# Patient Record
Sex: Female | Born: 1995 | Hispanic: No | Marital: Single | Smoking: Never smoker
Health system: Southern US, Community
[De-identification: ages and names within clinical notes are randomized; demographics above are authoritative.]

## PROBLEM LIST (undated history)

## (undated) ENCOUNTER — Inpatient Hospital Stay (HOSPITAL_COMMUNITY): Payer: Self-pay

## (undated) DIAGNOSIS — L509 Urticaria, unspecified: Secondary | ICD-10-CM

## (undated) DIAGNOSIS — O418X90 Other specified disorders of amniotic fluid and membranes, unspecified trimester, not applicable or unspecified: Secondary | ICD-10-CM

## (undated) DIAGNOSIS — G43909 Migraine, unspecified, not intractable, without status migrainosus: Secondary | ICD-10-CM

## (undated) DIAGNOSIS — I82409 Acute embolism and thrombosis of unspecified deep veins of unspecified lower extremity: Secondary | ICD-10-CM

## (undated) DIAGNOSIS — T7840XA Allergy, unspecified, initial encounter: Secondary | ICD-10-CM

## (undated) HISTORY — DX: Other specified disorders of amniotic fluid and membranes, unspecified trimester, not applicable or unspecified: O41.8X90

## (undated) HISTORY — PX: SHOULDER SURGERY: SHX246

## (undated) HISTORY — DX: Urticaria, unspecified: L50.9

## (undated) HISTORY — DX: Migraine, unspecified, not intractable, without status migrainosus: G43.909

## (undated) HISTORY — DX: Allergy, unspecified, initial encounter: T78.40XA

## (undated) HISTORY — PX: WISDOM TOOTH EXTRACTION: SHX21

---
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
## 2013-01-23 ENCOUNTER — Telehealth: Payer: Self-pay | Admitting: Pediatrics

## 2013-01-23 NOTE — Telephone Encounter (Signed)
Headache calendar from April 2014 on [place]. 29 days were recorded.  8 days were headache free.  19 days were associated with tension type headaches, 4 required treatment.  There were 2 days of migraines, 2 were severe.  There were 3 days of blurry vision without headaches, 2 days of dizziness without headaches.  There is no reason to change current treatment.  Please contact the family.  Please find out if sumatriptan helped.

## 2013-01-24 NOTE — Telephone Encounter (Signed)
I spoke with [name] the patient's mom informing her that Dr. Sharene Skeans has reviewed [name] diary and there's no need to make any changes and a reminder to send in May when completed, mom agreed. I asked mom on the 2 days that the migraines were severe did the Sumatriptan work for the patient and mom stated that the patient informed her that she was unable to get any relief on those two occasions that the migraine was severe with nausea and nothing helped. Mom also feels that Melissa Boyd has final exams coming up and she's very stressed out from this and that's why her migraines have started to come back. Thanks,

## 2013-02-15 ENCOUNTER — Telehealth: Payer: Self-pay | Admitting: Pediatrics

## 2013-02-15 NOTE — Telephone Encounter (Signed)
Headache calendar from May 2014 on [place]. 29 days were recorded.  0 days were headache free.  27 days were associated with tension type headaches, 5 required treatment.  There were 2 days of migraines, 1 was severe.  There is no reason to change current treatment.  Please contact the patient.

## 2013-02-16 NOTE — Telephone Encounter (Signed)
I spoke with [name] the patient's mom informing her that Dr. Sharene Skeans has reviewed Melissa Boyd's May diary and there's no need to make any changes and a reminder to send in June when completed, mom agreed. MB

## 2013-03-21 ENCOUNTER — Telehealth: Payer: Self-pay | Admitting: Pediatrics

## 2013-03-21 DIAGNOSIS — G43009 Migraine without aura, not intractable, without status migrainosus: Secondary | ICD-10-CM

## 2013-03-21 NOTE — Telephone Encounter (Signed)
Headache calendar from June 2014 on [place]. 30 days were recorded.  0 days were headache free.  25 days were associated with tension type headaches, 9 required treatment.  There were 5 days of migraines, 2 were severe.

## 2013-03-23 NOTE — Telephone Encounter (Signed)
I was unable to leave a message on the patient's phone and left a message on her mother's phone.

## 2013-03-24 MED ORDER — SUMATRIPTAN 20 MG/ACT NA SOLN: NASAL | Status: DC

## 2013-03-24 NOTE — Telephone Encounter (Signed)
Pleshette has returned Dr. Darl Householder call and she asked that he call her back at [phone]. MB

## 2013-03-24 NOTE — Telephone Encounter (Signed)
We decided not to increase topiramate because it may effect the patient's birth control.  Instead I am going to try nasal sumatriptan.

## 2013-03-27 ENCOUNTER — Telehealth: Payer: Self-pay | Admitting: Family

## 2013-03-27 NOTE — Telephone Encounter (Signed)
Melissa Boyd left a message saying that today she had a burning sensation in her face along with a stabbing painful headache. I called her back and she said that the burning sensation was at her nose to around eye and the right side top of head. She said that it went away after about 3-4 minutes but was very painful while present. I explained innervation to her face and that some people experience burning in the face/cheek/eye region along with ice pick type headache with migraine variant. I asked her to note it on her headache diary. She had no further questions. Inocencia's numbers [id number] or [phone]. TG

## 2013-03-27 NOTE — Telephone Encounter (Signed)
Thank you for the message.  I agree with your assessment and recommendations.  I did not call the patient back.

## 2013-05-01 ENCOUNTER — Telehealth: Payer: Self-pay | Admitting: Pediatrics

## 2013-05-01 NOTE — Telephone Encounter (Signed)
Headache calendar from July 2014 on [place]. 31 days were recorded.  6 days were headache free.  20 days were associated with tension type headaches, 3 required treatment.  There were 2 days of migraines, 0 were severe.  Will to today's of blurred vision in 1 a burning stabbing sensation in the right face.  There is no reason to change current treatment.  Please contact the family.

## 2013-05-02 ENCOUNTER — Other Ambulatory Visit: Payer: Self-pay | Admitting: Orthopedic Surgery

## 2013-05-02 DIAGNOSIS — M545 Low back pain: Secondary | ICD-10-CM

## 2013-05-02 DIAGNOSIS — M549 Dorsalgia, unspecified: Secondary | ICD-10-CM

## 2013-05-02 NOTE — Telephone Encounter (Signed)
I spoke with Melissa Boyd informing her that Dr. Sharene Skeans has reviewed her July diary and there's no need to make any changes and a reminder to send in August when completed, she agreed. MB

## 2013-05-04 ENCOUNTER — Encounter: Payer: Self-pay | Admitting: Family

## 2013-05-04 ENCOUNTER — Ambulatory Visit
Admission: RE | Admit: 2013-05-04 | Discharge: 2013-05-04 | Disposition: A | Discharge status: Home / Self Care | Payer: BC Managed Care – PPO | Source: Ambulatory Visit | Attending: Orthopedic Surgery | Admitting: Orthopedic Surgery

## 2013-05-04 ENCOUNTER — Ambulatory Visit (INDEPENDENT_AMBULATORY_CARE_PROVIDER_SITE_OTHER): Payer: BC Managed Care – PPO | Admitting: Family

## 2013-05-04 VITALS — BP 108/70 | HR 70 | Ht 67.0 in | Wt 170.2 lb

## 2013-05-04 DIAGNOSIS — G43009 Migraine without aura, not intractable, without status migrainosus: Secondary | ICD-10-CM

## 2013-05-04 DIAGNOSIS — M545 Low back pain: Secondary | ICD-10-CM

## 2013-05-04 DIAGNOSIS — M549 Dorsalgia, unspecified: Secondary | ICD-10-CM

## 2013-05-04 DIAGNOSIS — G44219 Episodic tension-type headache, not intractable: Secondary | ICD-10-CM

## 2013-05-04 DIAGNOSIS — G43909 Migraine, unspecified, not intractable, without status migrainosus: Secondary | ICD-10-CM

## 2013-05-04 DIAGNOSIS — G43809 Other migraine, not intractable, without status migrainosus: Secondary | ICD-10-CM

## 2013-05-04 DIAGNOSIS — G43109 Migraine with aura, not intractable, without status migrainosus: Secondary | ICD-10-CM

## 2013-05-04 MED ORDER — ONDANSETRON 4 MG PO TBDP: ORAL_TABLET | ORAL | Status: DC

## 2013-05-04 MED ORDER — TOPIRAMATE 25 MG PO TABS: ORAL_TABLET | ORAL | Status: DC

## 2013-05-04 MED ORDER — SUMATRIPTAN 20 MG/ACT NA SOLN: NASAL | Status: DC

## 2013-05-04 MED ORDER — SUMATRIPTAN SUCCINATE 100 MG PO TABS: ORAL_TABLET | ORAL | Status: DC

## 2013-05-04 NOTE — Progress Notes (Signed)
Patient: Melissa Boyd MRN: [id number] Sex: female DOB: 11/16/1995  Provider: Elveria Rising, NP Location of Care: [facility]  Note type: Routine return visit  History of Present Illness: Referral Source: Dr. Pasty Spillers. Young History from: patient Chief Complaint: Migraine Headaches  Melissa Boyd is a 17 y.o. female with history of mixed muscle contraction and migraine headaches. She is taking and tolerating Topiramate for her her migraines. 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 Melissa Boyd has been keeping headache diaries and has daily headaches that are largely tension headaches with occasional migraines. She brought in a headache diary today that shows 4 episodes of migraines this month. Fortunately, she was able to stop the migraines as they were starting. With the migraines, she has unilateral left side head pain with eye pain, that are not so severe that she cannot function but severe enough that it alters her activities for the day. She typically has blurred vision before the migraine pain begins and if she catches it soon enough and treats the migraine then, can sometimes abort the the migraine completely. If not, she develops pain and vomiting. She gets extremely nauseated and is unable to keep down liquids or medications with these migraines.  She has found that the Sumatriptan nasal spray works well but she cannot administer it to herself. She has to have someone do it for her, so that limits her ability to take it while in college.   Melissa Boyd is doing well in college and says that she does not feel particularly stressed, despite the daily tension headaches. She has a busy schedule, with her academics and her track schedule. She says that she has little free time during the school year. .   Review of Systems: 12 system review was remarkable for blurred vision, eye pain and headache  Past Medical History  Diagnosis Date  .  Headache, migraine    Hospitalizations: no, Head Injury: no, Nervous System Infections: no, Immunizations up to date: yes Past Medical History Comments: she had sports injury of strained hamstring and hip flexor, strained back muscles late last year while exercising   Surgical History Past Surgical History  Procedure Laterality Date  . Wisdom tooth extraction    . Shoulder surgery      left    Family History family history includes Alzheimer's disease in her paternal grandfather; Anemia in her father; Hypertension in her maternal aunt. Family History is negative migraines, seizures, cognitive impairment, blindness, deafness, birth defects, chromosomal disorder, autism.  Social History History   Social History  . Marital Status: Single    Spouse Name: N/A    Number of Children: N/A  . Years of Education: N/A   Social History Main Topics  . Smoking status: Never Smoker   . Smokeless tobacco: Never Used  . Alcohol Use: No  . Drug Use: No  . Sexual Activity: Yes    Birth Control/ Protection: Condom     Comment: lo-loestrin fe    Other Topics Concern  . None   Social History Narrative  . None   Educational level: sophomore at [employer] Attending: Florence Canner Occupation: Student  Living with parents and brother until she returns to school  Hobbies/Interest: Track School comments Melissa Boyd's major at [employer] is [employer].  Current Outpatient Prescriptions on File Prior to Visit  Medication Sig Dispense Refill  . Norethindrone-Ethinyl Estradiol-Fe Biphas (LO LOESTRIN FE) 1 MG-10 MCG / 10 MCG tablet Take 1  tablet by mouth once.  1 Package  11  . ondansetron (ZOFRAN-ODT) 4 MG disintegrating tablet Take 1 tablet under the tongue at the onset of nausea and vomiting, and every 8 hours as needed.  20 tablet  5  . SUMAtriptan (IMITREX) 100 MG tablet Take 1 at onset of migraine  6 tablet  5  . SUMAtriptan (IMITREX) 20 MG/ACT nasal spray One spray into nose  at onset of migraine, and may repeat in 2 hours if headache persists  2 Inhaler  5  . topiramate (TOPAMAX) 25 MG tablet Take 2 tablets at bedtime  60 tablet  5   No current facility-administered medications on file prior to visit.   The medication list was reviewed and reconciled. All changes or newly prescribed medications were explained.  A complete medication list was provided to the patient/caregiver.  Allergies  Allergen Reactions  . Shellfish Allergy     Physical Exam BP 108/70  Pulse 70  Ht 5\' 7"  (1.702 m)  Wt 170 lb 3.2 oz (77.202 kg)  BMI 26.65 kg/m2 General: well developed, well nourished young woman, seated on exam table, in no evident distress  Head: head normocephalic and atraumatic. Oropharynx benign.  Neck: supple with no carotid or supraclavicular bruits  Cardiovascular: regular rate and rhythm, no murmurs  Musculoskeletal: no obvious deformities or scoliosis  Skin: No rashes or lesions  Neurologic Exam  Mental Status: Awake and fully alert. Oriented to place and time. Recent and remote memory intact. Attention span, concentration, and fund of knowledge appropriate. Mood and affect appropriate.  Cranial Nerves: Fundoscopic exam revels sharp disc margins. Pupils equal, briskly reactive to light. Extraocular movements full without nystagmus. Visual fields full to confrontation. Hearing intact and symmetric to finger rub. Facial sensation intact. Face tongue, palate move normally and symmetrically. Neck flexion and extension normal.  Motor: Normal bulk and tone. Normal strength in all tested extremity muscles.  Sensory: Intact to touch and temperature in all extremities.  Coordination: Rapid alternating movements normal in all extremities. Finger-to-nose and heel-to shin performed accurately bilaterally. Romberg negative.  Gait and Station: Arises from chair without difficulty. Stance is normal. Gait demonstrates normal stride length and balance. Able to heel, toe and  tandem walk without difficulty.  Reflexes: Diminished and symmetric. Toes downgoing.   Assessment and Plan Melissa Boyd is a 17 year old young woman with history of mixed muscle contraction and migraine headaches. She is taking and tolerating Topiramate for prevention of migraines, but has had 4 migraines this month. I recommended that she increase Topiramate to 75mg  at bedtime. I reminded her to drink plenty of water with this medication. I asked Melissa Boyd to continue to keep a headache diary and to send it in monthly. I will call her when I receive a diary and discuss it with her. She will continue to use Sumatriptan and Zofran for abortive treatment of her migraines. As Melissa Boyd is a woman of childbearing age, she was reminded that this medication could be potentially teratogenic in pregnancy. She was cautioned to use dependable birth control methods and to take a folic acid supplement daily. Melissa Boyd was reminded that Topamax could render oral contraceptives potentially less effective as contraception and if she is sexually active, that a second barrier method should be used. I will see Melissa Boyd back in follow up in 6 months or sooner if needed.

## 2013-05-04 NOTE — Patient Instructions (Addendum)
Increase Topiramate 25mg  to 3 tablets at bedtime. Remember to drink plenty of water with this medication.  Remember that Topiramate makes oral contraceptives less effective as birth control and use a second barrier method if you are sexually active.  When you have a migraine, take Sumatriptan 100mg  tablet or Sumatriptan nasal spray along with Advil or Aleve. I have sent your prescriptions for Topiramate, Sumatriptan and Ondansetran to [facility].  Continue to keep a headache diary and send it in monthly.  I will see you back in follow up in 6 months or sooner if needed. Call me if you have any questions or concerns.

## 2013-06-18 ENCOUNTER — Emergency Department (INDEPENDENT_AMBULATORY_CARE_PROVIDER_SITE_OTHER): Payer: BC Managed Care – PPO

## 2013-06-18 ENCOUNTER — Emergency Department (HOSPITAL_COMMUNITY)
Admission: EM | Admit: 2013-06-18 | Discharge: 2013-06-18 | Disposition: A | Discharge status: Home / Self Care | Payer: BC Managed Care – PPO | Source: Home / Self Care | Attending: Emergency Medicine | Admitting: Emergency Medicine

## 2013-06-18 ENCOUNTER — Encounter (HOSPITAL_COMMUNITY): Payer: Self-pay | Admitting: *Deleted

## 2013-06-18 DIAGNOSIS — M79609 Pain in unspecified limb: Secondary | ICD-10-CM

## 2013-06-18 DIAGNOSIS — M79674 Pain in right toe(s): Secondary | ICD-10-CM

## 2013-06-18 MED ORDER — MELOXICAM 15 MG PO TABS: 15.0000 mg | ORAL_TABLET | Freq: Every day | ORAL | Status: DC

## 2013-06-18 NOTE — ED Provider Notes (Signed)
Chief Complaint:   Chief Complaint  Patient presents with  . 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 She throws shot put, Diskus, and hammer. She has a two-week history of pain in the right foot over the MTP joint of the right great toe. This is been swollen bruised. She denies any injury. It hurts to put pressure on it and also to walk. There is no numbness or tingling.  Review of Systems:  Other than noted above, the patient denies any of the following symptoms: Systemic:  No fevers, chills, or sweats.  No fatigue or tiredness. Musculoskeletal:  No joint pain, arthritis, bursitis, swelling, or back pain.  Neurological:  No muscular weakness, paresthesias.  PMFSH:  Past medical history, family history, social history, meds, and allergies were reviewed.  She has a history of migraine headaches and takes sumatriptan and Topamax.  Physical Exam:   Vital signs:  BP 117/78  Pulse 52  Temp(Src) 99.3 F (37.4 C) (Oral)  Resp 15  SpO2 100%  LMP 05/26/2013 Gen:  Alert and oriented times 3.  In no distress. Musculoskeletal:  Exam of the foot reveals slight swelling and pain to palpation over the MTP joint of the right great toe. The toe has a full range of motion with slight pain.  Otherwise, all joints had a full a ROM with no swelling, bruising or deformity.  No edema, pulses full. Extremities were warm and pink.  Capillary refill was brisk.  Skin:  Clear, warm and dry.  No rash. Neuro:  Alert and oriented times 3.  Muscle strength was normal.  Sensation was intact to light touch.   Radiology:  Dg Foot Complete Right  06/18/2013   CLINICAL DATA:  Metatarsal pain  EXAM: RIGHT FOOT COMPLETE - 3+ VIEW  COMPARISON:  None.  FINDINGS: There is no evidence of fracture or dislocation. There is no evidence of arthropathy or other focal bone abnormality. Soft tissues are unremarkable.  IMPRESSION: No acute  abnormality is noted.   Electronically Signed   By: Alcide Clever M.D.   On: 06/18/2013 14:16   I reviewed the images independently and personally and concur with the radiologist's findings.  Course in Urgent Care Center:   The toes buddy taped and she was put in a postoperative boot.  Assessment:  The encounter diagnosis was Great toe pain, right.  Differential diagnosis is a bone bruise, sesamoiditis, stress fracture, or a ligament strain. We'll need followup with sports medicine.  Plan:   1.  Meds:  The following meds were prescribed:   Discharge Medication List as of 06/18/2013  2:43 PM    START taking these medications   Details  meloxicam (MOBIC) 15 MG tablet Take 1 tablet (15 mg total) by mouth daily., Starting 06/18/2013, Until Discontinued, Normal        2.  Patient Education/Counseling:  The patient was given appropriate handouts, self care instructions, and instructed in symptomatic relief including rest and activity, elevation, application of ice and compression.    3.  Follow up:  The patient was told to follow up if no better in 3 to 4 days, if becoming worse in any way, and given some red flag symptoms such as worsening pain which would prompt immediate return.  Follow up Dr. Pati Gallo next week.       Reuben Likes, MD 06/18/13 2136

## 2013-06-18 NOTE — ED Notes (Signed)
Patient complains of right foot pain around 1st metatarsal head; states swelling, and purple discoloration that started 2 weeks ago; patient states pain is worse when wearing shoes.

## 2013-06-19 ENCOUNTER — Telehealth: Payer: Self-pay | Admitting: Pediatrics

## 2013-06-19 NOTE — Telephone Encounter (Signed)
Headache calendar from September 2014 on [place]. 30 days were recorded.  5 days were headache free.  24 days were associated with tension type headaches, 3 required treatment.  There was 1 day of migraines, 0 were severe.  There is no reason to change current treatment.  Please contact the patient.

## 2013-06-20 NOTE — Telephone Encounter (Signed)
I spoke with Melissa Boyd the patient's mom informing her that Dr. Sharene Skeans has reviewed [facility] September diary and there's no need to make any changes and a reminder for her to send in October when completed, mom agreed to inform patient of this info. MB

## 2013-07-31 ENCOUNTER — Telehealth: Payer: Self-pay | Admitting: Pediatrics

## 2013-07-31 NOTE — Telephone Encounter (Signed)
Headache calendar from October 2014 on [place]. 31 days were recorded.  no days were headache free.  29 days were associated with tension type headaches, 3 required treatment.  There were 2 days of migraines, 1 was severe.  There is no reason to change current treatment.  Please contact the patient.  Find out if sumatriptan help shorten her course of disability on October 28 and 29.

## 2013-08-02 NOTE — Telephone Encounter (Signed)
I left a message for Melissa Boyd and asked her to call me back. TG 

## 2013-08-07 ENCOUNTER — Encounter: Payer: Self-pay | Admitting: Family

## 2013-08-07 NOTE — Telephone Encounter (Signed)
I have been unable to reach Melissa Boyd by phone. I will mail a letter asking her to call me. TG

## 2013-08-14 ENCOUNTER — Telehealth: Payer: Self-pay | Admitting: Pediatrics

## 2013-08-14 NOTE — Telephone Encounter (Signed)
Headache calendar from November 2014 on [place]. 30 days were recorded.  0 days were headache free.  28 days were associated with tension type headaches, 1 required treatment.  There were 2 days of migraines, 2 were severe.  There is no reason to change current treatment.  Please contact the family. How long did her migraines last?

## 2013-08-14 NOTE — Telephone Encounter (Signed)
I left a message on the voicemail of Melissa Boyd informing her that Dr. Sharene Skeans has reviewed her November diary and there's no need to make any changes and a reminder to send in December when complete. I also asked that she return my call in regards to the 2 migraines that she had, I informed her that Dr. Sharene Skeans would like to know how long they lasted. MB

## 2013-08-15 NOTE — Telephone Encounter (Signed)
Betsey Holiday, mother, lvm stating that child is away at college. Mom said that she is returning the call about the headache diary. She asked that you call her at [phone].

## 2013-08-15 NOTE — Telephone Encounter (Signed)
I left a message on voicemail of Melissa Boyd the patient's mom asking for a return call to inform Dr. Sharene Skeans of the length of the 2 migraines [name] had in November. MB

## 2013-08-16 NOTE — Telephone Encounter (Signed)
[name] the patient's mom called and stated she has spoken with [name] and the 2 severe migraines that she had recorded on her November diary lasted about 2 hours each. Mom can be reached if any additional information is needed at [phone]. MB

## 2013-08-16 NOTE — Telephone Encounter (Signed)
Noted, I have no plans to change her treatment.  Thank you

## 2013-09-25 ENCOUNTER — Telehealth: Payer: Self-pay | Admitting: Pediatrics

## 2013-09-25 NOTE — Telephone Encounter (Signed)
[place] 31 days were recorded.  2 days were headache free.  26 days were associated with tension type headaches, 2 required treatment.  There were 3 days of migraines, 1 was severe.  She had 3 days in a row of blurred vision, 2 were clearly migrainous.  There is no reason to change current treatment.  Please contact the patient.

## 2013-09-26 NOTE — Telephone Encounter (Signed)
I spoke with Melissa Boyd the patient's mom informing her that Dr. [facility]ickling [name] and there's no need to make any changes and a reminder to send in January when complete, mom agreed. MB

## 2013-09-28 NOTE — Telephone Encounter (Signed)
Noted, thank you

## 2013-10-13 ENCOUNTER — Other Ambulatory Visit: Payer: Self-pay

## 2013-10-13 DIAGNOSIS — G43109 Migraine with aura, not intractable, without status migrainosus: Secondary | ICD-10-CM

## 2013-10-13 DIAGNOSIS — G43009 Migraine without aura, not intractable, without status migrainosus: Secondary | ICD-10-CM

## 2013-10-13 DIAGNOSIS — G43909 Migraine, unspecified, not intractable, without status migrainosus: Secondary | ICD-10-CM

## 2013-10-13 DIAGNOSIS — G43809 Other migraine, not intractable, without status migrainosus: Secondary | ICD-10-CM

## 2013-10-13 MED ORDER — SUMATRIPTAN SUCCINATE 100 MG PO TABS: ORAL_TABLET | ORAL | Status: DC

## 2013-10-13 MED ORDER — SUMATRIPTAN 20 MG/ACT NA SOLN: NASAL | Status: DC

## 2013-10-13 MED ORDER — TOPIRAMATE 25 MG PO TABS: ORAL_TABLET | ORAL | Status: DC

## 2013-10-27 ENCOUNTER — Emergency Department (HOSPITAL_COMMUNITY): Payer: BC Managed Care – PPO

## 2013-10-27 ENCOUNTER — Encounter (HOSPITAL_COMMUNITY): Payer: Self-pay | Admitting: Emergency Medicine

## 2013-10-27 ENCOUNTER — Emergency Department (HOSPITAL_COMMUNITY)
Admission: EM | Admit: 2013-10-27 | Discharge: 2013-10-27 | Disposition: A | Discharge status: Home / Self Care | Payer: BC Managed Care – PPO | Attending: Emergency Medicine | Admitting: Emergency Medicine

## 2013-10-27 DIAGNOSIS — R630 Anorexia: Secondary | ICD-10-CM | POA: Insufficient documentation

## 2013-10-27 DIAGNOSIS — A088 Other specified intestinal infections: Secondary | ICD-10-CM | POA: Insufficient documentation

## 2013-10-27 DIAGNOSIS — J029 Acute pharyngitis, unspecified: Secondary | ICD-10-CM | POA: Insufficient documentation

## 2013-10-27 DIAGNOSIS — Z791 Long term (current) use of non-steroidal anti-inflammatories (NSAID): Secondary | ICD-10-CM | POA: Insufficient documentation

## 2013-10-27 DIAGNOSIS — R079 Chest pain, unspecified: Secondary | ICD-10-CM | POA: Insufficient documentation

## 2013-10-27 DIAGNOSIS — R059 Cough, unspecified: Secondary | ICD-10-CM

## 2013-10-27 DIAGNOSIS — R05 Cough: Secondary | ICD-10-CM

## 2013-10-27 DIAGNOSIS — A084 Viral intestinal infection, unspecified: Secondary | ICD-10-CM

## 2013-10-27 DIAGNOSIS — R0982 Postnasal drip: Secondary | ICD-10-CM

## 2013-10-27 DIAGNOSIS — G43909 Migraine, unspecified, not intractable, without status migrainosus: Secondary | ICD-10-CM | POA: Insufficient documentation

## 2013-10-27 LAB — COMPREHENSIVE METABOLIC PANEL
ALK PHOS: 46 U/L (ref 39–117)
ALT: 12 U/L (ref 0–35)
AST: 18 U/L (ref 0–37)
Albumin: 3.6 g/dL (ref 3.5–5.2)
BILIRUBIN TOTAL: 0.3 mg/dL (ref 0.3–1.2)
BUN: 11 mg/dL (ref 6–23)
CO2: 22 meq/L (ref 19–32)
Calcium: 8.6 mg/dL (ref 8.4–10.5)
Chloride: 103 mEq/L (ref 96–112)
Creatinine, Ser: 0.88 mg/dL (ref 0.50–1.10)
GLUCOSE: 90 mg/dL (ref 70–99)
POTASSIUM: 3.7 meq/L (ref 3.7–5.3)
SODIUM: 137 meq/L (ref 137–147)
Total Protein: 6.9 g/dL (ref 6.0–8.3)

## 2013-10-27 LAB — CBC
HEMATOCRIT: 37.8 % (ref 36.0–46.0)
HEMOGLOBIN: 13.1 g/dL (ref 12.0–15.0)
MCH: 33.2 pg (ref 26.0–34.0)
MCHC: 34.7 g/dL (ref 30.0–36.0)
MCV: 95.7 fL (ref 78.0–100.0)
Platelets: 201 10*3/uL (ref 150–400)
RBC: 3.95 MIL/uL (ref 3.87–5.11)
RDW: 12.3 % (ref 11.5–15.5)
WBC: 5.6 10*3/uL (ref 4.0–10.5)

## 2013-10-27 LAB — LIPASE, BLOOD: LIPASE: 40 U/L (ref 11–59)

## 2013-10-27 MED ORDER — ONDANSETRON 4 MG PO TBDP: ORAL_TABLET | ORAL | Status: DC

## 2013-10-27 MED ORDER — BENZONATATE 100 MG PO CAPS: 100.0000 mg | ORAL_CAPSULE | Freq: Three times a day (TID) | ORAL | Status: DC

## 2013-10-27 MED ORDER — SODIUM CHLORIDE 0.9 % IV BOLUS (SEPSIS)
1000.0000 mL | Freq: Once | INTRAVENOUS | Status: AC
  Administered 2013-10-27: 1000 mL via INTRAVENOUS

## 2013-10-27 NOTE — ED Notes (Signed)
Pt states has had a cough x 1 month, chest pain x 1 week, states at times its a dull burning feeling to R side of chest, then today started having vomiting and diarrhea.

## 2013-10-27 NOTE — ED Provider Notes (Signed)
CSN: [id number]     Arrival date & time 10/27/13  0016 History   First MD Initiated Contact with Patient 10/27/13 0035     Chief Complaint  Patient presents with  . Emesis  . Diarrhea  . Cough     (Consider location/radiation/quality/duration/timing/severity/associated sxs/prior Treatment) HPI Comments: Patient is a 18 year old female with a past medical history of migraines who presents to the emergency department with her father complaining of cough x1 month, initially nonproductive with sore throat, recently becoming productive. States she has had a cough for so long that her chest is beginning to hurt, located in the center occasionally on the right, described as sharp, intermittent. Today patient began having nausea, vomiting and diarrhea. She's had multiple episodes of nonbloody, nonbilious emesis and nonbloody diarrhea. Admits to associated fever. She has no appetite. Denies abdominal pain. No aggravating or alleviating factors.  Patient is a 18 y.o. female presenting with vomiting, diarrhea, and cough. The history is provided by the patient and a parent.  Emesis Associated symptoms: diarrhea and sore throat   Diarrhea Associated symptoms: fever and vomiting   Cough Associated symptoms: chest pain, fever and sore throat     Past Medical History  Diagnosis Date  . Headache, migraine    Past Surgical History  Procedure Laterality Date  . Wisdom tooth extraction    . Shoulder surgery      left   Family History  Problem Relation Age of Onset  . Anemia Father     low iron   . Hypertension Maternal Aunt   . Alzheimer's disease Paternal Grandfather    History  Substance Use Topics  . Smoking status: Never Smoker   . Smokeless tobacco: Never Used  . Alcohol Use: No   OB History   Grav Para Term Preterm Abortions TAB SAB Ect Mult Living   0              Review of Systems  Constitutional: Positive for fever and appetite change.  HENT: Positive for sore throat.     Respiratory: Positive for cough.   Cardiovascular: Positive for chest pain.  Gastrointestinal: Positive for nausea, vomiting and diarrhea.  All other systems reviewed and are negative.      Allergies  Shellfish allergy  Home Medications   Current Outpatient Rx  Name  Route  Sig  Dispense  Refill  . guaiFENesin (MUCINEX) 600 MG 12 hr tablet   Oral   Take 600 mg by mouth 2 (two) times daily as needed.         . loperamide (IMODIUM) 2 MG capsule   Oral   Take 2 mg by mouth as needed for diarrhea or loose stools.         . Norethindrone-Ethinyl Estradiol-Fe Biphas (LO LOESTRIN FE) 1 MG-10 MCG / 10 MCG tablet   Oral   Take 1 tablet by mouth once.   1 Package   11   . topiramate (TOPAMAX) 25 MG tablet      Take 3 tablets at bedtime   270 tablet   0   . azithromycin (ZITHROMAX) 250 MG tablet               . benzonatate (TESSALON) 100 MG capsule   Oral   Take 1 capsule (100 mg total) by mouth every 8 (eight) hours.   21 capsule   0   . EPIPEN 2-PAK 0.3 MG/0.3ML SOAJ injection               .  meloxicam (MOBIC) 15 MG tablet   Oral   Take 1 tablet (15 mg total) by mouth daily.   15 tablet   0   . ondansetron (ZOFRAN-ODT) 4 MG disintegrating tablet      Take 1 tablet under the tongue at the onset of nausea and vomiting, and every 8 hours as needed.   20 tablet   5   . SUMAtriptan (IMITREX) 100 MG tablet      Take 1 at onset of migraine   18 tablet   0   . SUMAtriptan (IMITREX) 20 MG/ACT nasal spray      One spray into nose at onset of migraine, and may repeat in 2 hours if headache persists   9 Inhaler   0    BP 125/69  Pulse 63  Temp(Src) 98.7 F (37.1 C) (Oral)  Resp 18  Ht 5' 7.5" (1.715 m)  Wt 170 lb (77.111 kg)  BMI 26.22 kg/m2  SpO2 100%  LMP 10/17/2013 Physical Exam  Nursing note and vitals reviewed. Constitutional: She is oriented to person, place, and time. She appears well-developed and well-nourished. No distress.  HENT:   Head: Normocephalic and atraumatic.  Nose: Mucosal edema present.  Mouth/Throat: Oropharynx is clear and moist.  Post nasal drip.  Eyes: Conjunctivae are normal.  Neck: Normal range of motion. Neck supple.  Cardiovascular: Normal rate, regular rhythm and normal heart sounds.   Pulmonary/Chest: Effort normal and breath sounds normal. She has no decreased breath sounds. She has no wheezes. She has no rhonchi. She has no rales. She exhibits tenderness.    Abdominal: Soft. Bowel sounds are normal. She exhibits no distension. There is no tenderness.  Musculoskeletal: Normal range of motion. She exhibits no edema.  Neurological: She is alert and oriented to person, place, and time.  Skin: Skin is warm and dry. She is not diaphoretic.  Psychiatric: She has a normal mood and affect. Her behavior is normal.    ED Course  Procedures (including critical care time) Labs Review Labs Reviewed  CBC  COMPREHENSIVE METABOLIC PANEL  LIPASE, BLOOD   Imaging Review Dg Chest 2 View  10/27/2013   CLINICAL DATA:  Nausea, cough, fever  EXAM: CHEST  2 VIEW  COMPARISON:  None.  FINDINGS: The heart size and mediastinal contours are within normal limits. Both lungs are clear. The visualized skeletal structures are unremarkable.  IMPRESSION: No active cardiopulmonary disease.   Electronically Signed   By: Sherian ReinWei-Chen  Lin M.D.   On: 10/27/2013 01:46    EKG Interpretation   None       MDM   Final diagnoses:  Viral gastroenteritis  Cough  Post-nasal drip    Patient presenting with cough x1 month, nausea, vomiting, diarrhea x1 day. She is well appearing and in no apparent distress, normal vital signs, afebrile. Labs normal. Chest x-ray clear. After receiving IV fluids, patient reports she is feeling better. Abdomen remains soft and nontender. Symptomatic treatment discussed. Patient is stable for discharge. Return precautions given. Patient states understanding of treatment care plan and is agreeable.       Trevor MaceRobyn M Albert, PA-C 10/27/13 [id number]

## 2013-10-27 NOTE — ED Notes (Signed)
Pt also states has a knot behind R ear and sore throat x 1 month.

## 2013-10-27 NOTE — Discharge Instructions (Signed)
Rest, stay well-hydrated. Take Mucinex as directed, using nasal saline.  Cough, Adult  A cough is a reflex that helps clear your throat and airways. It can help heal the body or may be a reaction to an irritated airway. A cough may only last 2 or 3 weeks (acute) or may last more than 8 weeks (chronic).  CAUSES Acute cough:  Viral or bacterial infections. Chronic cough:  Infections.  Allergies.  Asthma.  Post-nasal drip.  Smoking.  Heartburn or acid reflux.  Some medicines.  Chronic lung problems (COPD).  Cancer. SYMPTOMS   Cough.  Fever.  Chest pain.  Increased breathing rate.  High-pitched whistling sound when breathing (wheezing).  Colored mucus that you cough up (sputum). TREATMENT   A bacterial cough may be treated with antibiotic medicine.  A viral cough must run its course and will not respond to antibiotics.  Your caregiver may recommend other treatments if you have a chronic cough. HOME CARE INSTRUCTIONS   Only take over-the-counter or prescription medicines for pain, discomfort, or fever as directed by your caregiver. Use cough suppressants only as directed by your caregiver.  Use a cold steam vaporizer or humidifier in your bedroom or home to help loosen secretions.  Sleep in a semi-upright position if your cough is worse at night.  Rest as needed.  Stop smoking if you smoke. SEEK IMMEDIATE MEDICAL CARE IF:   You have pus in your sputum.  Your cough starts to worsen.  You cannot control your cough with suppressants and are losing sleep.  You begin coughing up blood.  You have difficulty breathing.  You develop pain which is getting worse or is uncontrolled with medicine.  You have a fever. MAKE SURE YOU:   Understand these instructions.  Will watch your condition.  Will get help right away if you are not doing well or get worse. Document Released: 02/27/2011 Document Revised: 11/23/2011 Document Reviewed: 02/27/2011 [facility]  Patient Information 2014 [place], [place].  Viral Gastroenteritis Viral gastroenteritis is also known as stomach flu. This condition affects the stomach and intestinal tract. It can cause sudden diarrhea and vomiting. The illness typically lasts 3 to 8 days. Most people develop an immune response that eventually gets rid of the virus. While this natural response develops, the virus can make you quite ill. CAUSES  Many different viruses can cause gastroenteritis, such as rotavirus or noroviruses. You can catch one of these viruses by consuming contaminated food or water. You may also catch a virus by sharing utensils or other personal items with an infected person or by touching a contaminated surface. SYMPTOMS  The most common symptoms are diarrhea and vomiting. These problems can cause a severe loss of body fluids (dehydration) and a body salt (electrolyte) imbalance. Other symptoms may include:  Fever.  Headache.  Fatigue.  Abdominal pain. DIAGNOSIS  Your caregiver can usually diagnose viral gastroenteritis based on your symptoms and a physical exam. A stool sample may also be taken to test for the presence of viruses or other infections. TREATMENT  This illness typically goes away on its own. Treatments are aimed at rehydration. The most serious cases of viral gastroenteritis involve vomiting so severely that you are not able to keep fluids down. In these cases, fluids must be given through an intravenous line (IV). HOME CARE INSTRUCTIONS   Drink enough fluids to keep your urine clear or pale yellow. Drink small amounts of fluids frequently and increase the amounts as tolerated.  Ask your caregiver for specific  rehydration instructions.  Avoid:  Foods high in sugar.  Alcohol.  Carbonated drinks.  Tobacco.  Juice.  Caffeine drinks.  Extremely hot or cold fluids.  Fatty, greasy foods.  Too much intake of anything at one time.  Dairy products until 24 to 48 hours after  diarrhea stops.  You may consume probiotics. Probiotics are active cultures of beneficial bacteria. They may lessen the amount and number of diarrheal stools in adults. Probiotics can be found in yogurt with active cultures and in supplements.  Wash your hands well to avoid spreading the virus.  Only take over-the-counter or prescription medicines for pain, discomfort, or fever as directed by your caregiver. Do not give aspirin to children. Antidiarrheal medicines are not recommended.  Ask your caregiver if you should continue to take your regular prescribed and over-the-counter medicines.  Keep all follow-up appointments as directed by your caregiver. SEEK IMMEDIATE MEDICAL CARE IF:   You are unable to keep fluids down.  You do not urinate at least once every 6 to 8 hours.  You develop shortness of breath.  You notice blood in your stool or vomit. This may look like coffee grounds.  You have abdominal pain that increases or is concentrated in one small area (localized).  You have persistent vomiting or diarrhea.  You have a fever.  The patient is a child younger than 3 months, and he or she has a fever.  The patient is a child older than 3 months, and he or she has a fever and persistent symptoms.  The patient is a child older than 3 months, and he or she has a fever and symptoms suddenly get worse.  The patient is a baby, and he or she has no tears when crying. MAKE SURE YOU:   Understand these instructions.  Will watch your condition.  Will get help right away if you are not doing well or get worse. Document Released: 08/31/2005 Document Revised: 11/23/2011 Document Reviewed: 06/17/2011 [facility] Patient Information 2014 [place], [place].

## 2013-10-28 NOTE — ED Provider Notes (Signed)
Medical screening examination/treatment/procedure(s) were performed by non-physician practitioner and as supervising physician I was immediately available for consultation/collaboration.  EKG Interpretation   None        Aloma Boch, MD 10/28/13 0539 

## 2013-11-03 ENCOUNTER — Other Ambulatory Visit: Payer: Self-pay | Admitting: Family

## 2013-11-09 ENCOUNTER — Ambulatory Visit: Payer: BC Managed Care – PPO | Admitting: Family

## 2014-01-08 ENCOUNTER — Telehealth: Payer: Self-pay | Admitting: *Deleted

## 2014-01-08 NOTE — Telephone Encounter (Addendum)
I spoke father.  The latter has been written.  He may pick it up today.  Headache calendar from February 2015 on [place]. 28 days were recorded.  1 day was headache free.  35 days were associated with tension type headaches, 3 required treatment.  There were 2 days of migraines, 1 was severe.  Headache calendar from March 2015 on [name]. 30 days were recorded.  2 days were headache free.  27 days were associated with tension type headaches, 3 required treatment.  There was 1 day of migraines, 1 was severe.  There is no reason to change current treatment.  I spoke with her father.  She's not been seen since August 2014.  She needs a return visit.  She should see Inetta Fermoina in follow up if that can be arranged.

## 2014-01-08 NOTE — Telephone Encounter (Signed)
I spoke with Maisie Fushomas, father,  and made an appt for [name] to see Inetta Fermoina on 02/06/14

## 2014-01-08 NOTE — Telephone Encounter (Signed)
Melissa Boyd, father, needs a letter stating the pt can't be in the heat and sun while waiting for her meet to start. She is leaving this Thursday until Sunday [place] with her college. The migraines are triggered by the sun and certain foods. She needs to be somewhere comfortable, out of the sun and heat. until her meet starts. You can reach him at [phone]

## 2014-01-08 NOTE — Telephone Encounter (Signed)
Noted. TG 

## 2014-01-17 ENCOUNTER — Telehealth: Payer: Self-pay | Admitting: Family

## 2014-01-17 NOTE — Telephone Encounter (Signed)
I received April headache diary for Melissa Boyd. The diary reveals 25 tension headaches, 7 of which required treatment. There were 5 migraines, 2 of them were severe. On 3 days, [name] noted that she felt dizzy and lightheaded. Two of these days were days with tension headaches that required treatment and 1 was a day with a migraine. On 3 days she noted that she felt weird, and on one of those she noted that she also had blurred vision. Two of those days were days of tension headaches that required treatment. The day of feeling weird and blurred vision was a day with a mgiraine. TG

## 2014-01-18 NOTE — Telephone Encounter (Signed)
I left a message for Melissa Boyd and asked her to return my call. TG

## 2014-01-19 ENCOUNTER — Encounter: Payer: Self-pay | Admitting: Family

## 2014-01-19 NOTE — Telephone Encounter (Signed)
I have been unable to reach Melissa Boyd by phone today. I mailed her a letter. TG

## 2014-01-26 NOTE — Telephone Encounter (Signed)
Melissa Boyd called me back this afternoon. We reviewed the April diary and she said that on the days that she wrote "felt weird" on the diary, she had trouble describing it but said that she felt weak, like she was dehydrated, but knew that she was not because she was drinking water and sports drinks. She said that migraine days where she had blurred vision and light headed feeling that she had to lie down for several hours. This month, she has had some bad days of migraines, with blurred vision and vomiting. She is busy with track and field activities and has an event this weekend in which she is attempting to qualify for regionals. She says that she knows that being overheated will trigger migraines, and that she works very hard to keep herself well hydrated. Melissa Boyd said that when she took medication for migraine, that it sometimes gave some relief but didn't usually abort it. She said that it would give her enough relief to be able to get up later and continue activities.   Her other concern is that she is having trouble with memory. She says that she forgets to take her medication and has to be reminded. She forgets day to day activities and needs reminders. She said that she did ok in school, but that her friends and family have noticed that she has trouble remembering simple things that should not be a problem. Melissa Boyd wonders if this is related to her Topiramate.   Melissa Boyd has an appointment on May 22nd. I told her that I would talk with Melissa Boyd about her headaches and her concern about her memory and call her back on Monday 01/29/14. She agreed with this plan. TG

## 2014-01-26 NOTE — Telephone Encounter (Signed)
The patient is on 75 mg of topiramate, a dose that has not been changed in quite some time.  I think it highly unlikely that this is related to topiramate particularly when she is doing well in school.  Obviously she is having difficulty because of the heat and her competition.  I suspect that she is also under stress with school and is probably not getting enough sleep.  It is unlikely that we will find a better alternative to this but we certainly can try.  I would not recommend doing so until the school year is out.

## 2014-02-02 ENCOUNTER — Encounter: Payer: Self-pay | Admitting: Family

## 2014-02-02 ENCOUNTER — Ambulatory Visit (INDEPENDENT_AMBULATORY_CARE_PROVIDER_SITE_OTHER): Payer: BC Managed Care – PPO | Admitting: Family

## 2014-02-02 VITALS — BP 110/72 | HR 70 | Ht 67.75 in | Wt 173.2 lb

## 2014-02-02 DIAGNOSIS — G44219 Episodic tension-type headache, not intractable: Secondary | ICD-10-CM

## 2014-02-02 DIAGNOSIS — G43009 Migraine without aura, not intractable, without status migrainosus: Secondary | ICD-10-CM

## 2014-02-02 DIAGNOSIS — G43109 Migraine with aura, not intractable, without status migrainosus: Secondary | ICD-10-CM

## 2014-02-02 DIAGNOSIS — G43809 Other migraine, not intractable, without status migrainosus: Secondary | ICD-10-CM

## 2014-02-02 MED ORDER — ATENOLOL 25 MG PO TABS: ORAL_TABLET | ORAL | Status: DC

## 2014-02-02 NOTE — Patient Instructions (Addendum)
For your headaches, I have suggested a trial of a medication called Atenolol.  Atenolol is a type of medication called a beta blocker. This means that it relaxes some muscles and blocks the action of some substances in the body such as epinephrine. This medication  is used for many things, such as treating some heart problems, blood pressure problems, tremor, performance anxiety (stage fright), text anxiety and migraine headaches, to name a few. Propranolol should not be taken by pregnant women. This type of medication can affect stamina and endurance in athletes that perform aerobic sports, such as running marathons. If you decide to try it, you will take it along with the Topiramate, and will take it 1/2 tablet at night for 1 week, then take 1 tablet at night thereafter. If you find that it affects your endurance, stop the medication. I sent the prescription to the pharmacy.   Continue your Topiramate without change for now. Remember to be very well hydrated while you take this medication.  We talked about stress management as you have a large number of tension headaches on the headache diary. Consider enrolling in a gentle yoga class. Some insurances will help to pay for the class if you look at your benefits under Alternative Medication.   You would also likely benefit from biofeedback training to help you learn to manage stress. I will make a referral to [facility] for that.   You have tight muscles in your upper back and this may be affecting your headaches. Consider massage therapy as well. That may also be covered with your insurance benefits under Alternative Medicine.   Please continue to keep your headache diaries and send them in monthly.   I would like to see you again in 3 months or sooner if needed.

## 2014-02-02 NOTE — Progress Notes (Signed)
Patient: Melissa Boyd MRN: [id number] Sex: female DOB: 17-Jan-1996  Provider: Elveria RisingGOODPASTURE, Garret Teale, NP [facility]  Note type: Routine return visit  History of Present Illness: Referral Source: Dr. Pasty SpillersWilliam O. Young History from: patient Chief Complaint: Migraines  Melissa Boyd is a 18 y.o. young woman with history of mixed muscle contraction and migraine headaches. She is taking and tolerating Topiramate for her her migraines. 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 Melissa Boyd has been keeping headache diaries and has daily headaches that are largely tension headaches with 2-4 migraines per month. She rarely has a headache free day during the month. In reviewing her headache diary with her today, I learned that Melissa Boyd has been taking Ibuprofen for all her tension headaches. With the migraines, she has unilateral left side head pain with eye pain, that are not so severe that she cannot function but severe enough that it alters her activities for the day. She typically has blurred vision before the migraine pain begins and if she catches it soon enough and treats the migraine then, can sometimes abort the the migraine completely. When she has a migraine, Sumatriptan gives relief, but only have she has slept for while. The migraines can be particularly troublesome on days that she has athletic competition.  Melissa Boyd has found that becoming overheated is a trigger for her migraines.   Melissa Boyd did well academically in the past year in college despite her migraines and athletic demands. She denies feeling particularly stressed but is very interested in learning stress management techniques and treatments. She is interested in adding a medication that might help give better control of her headaches. She will be spending her summer working with her Systems analystpersonal trainer.   Review of Systems: 12 system review was remarkable for memory problems and  dizziness  Past Medical History  Diagnosis Date  . Headache, migraine    Hospitalizations: no, Head Injury: no, Nervous System Infections: no, Immunizations up to date: yes Past Medical History Comments: She was treated at the ED for a stomach virus in Jan. 2015. She had sports injury of strained hamstring and hip flexor, as well as strained back muscles late last year while exercising.   Surgical History Past Surgical History  Procedure Laterality Date  . Wisdom tooth extraction    . Shoulder surgery      left    Family History family history includes Alzheimer's disease in her paternal grandfather; Anemia in her father; Hypertension in her maternal aunt. Family History is otherwise negative for migraines, seizures, cognitive impairment, blindness, deafness, birth defects, chromosomal disorder, autism.  Social History History   Social History  . Marital Status: Single    Spouse Name: N/A    Number of Children: N/A  . Years of Education: N/A   Social History Main Topics  . Smoking status: Never Smoker   . Smokeless tobacco: Never Used  . Alcohol Use: No  . Drug Use: No  . Sexual Activity: Yes    Birth Control/ Protection: Condom     Comment: lo-loestrin fe    Other Topics Concern  . None   Social History Narrative  . None   Educational level: junior in college  School Attending: [employer] Living with:  Mom during during school breaks Hobbies/Interest: Playing Track and [employer] Business [employer]Field  School comments:  Melissa Boyd is doing well in school. 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  Physical Exam BP 110/72  Pulse 70  Ht 5' 7.75" (1.721 m)  Wt 173 lb 3.2 oz (78.563 kg)  BMI 26.53 kg/m2  LMP 02/01/2014 General: well developed, well nourished young woman, seated on exam table, in no evident distress  Head: head normocephalic and atraumatic. Oropharynx benign.  Neck: supple with no carotid or supraclavicular bruits  Cardiovascular: regular rate and rhythm, no  murmurs  Musculoskeletal: no obvious deformities or scoliosis. She complains of soreness in her upper trapezius muscles today. Skin: No rashes or lesions   Neurologic Exam  Mental Status: Awake and fully alert. Oriented to place and time. Recent and remote memory intact. Attention span, concentration, and fund of knowledge appropriate. Mood and affect appropriate.  Cranial Nerves: Fundoscopic exam revels sharp disc margins. Pupils equal, briskly reactive to light. Extraocular movements full without nystagmus. Visual fields full to confrontation. Hearing intact and symmetric to finger rub. Facial sensation intact. Face tongue, palate move normally and symmetrically. Neck flexion and extension normal.  Motor: Normal bulk and tone. Normal strength in all tested extremity muscles.  Sensory: Intact to touch and temperature in all extremities.  Coordination: Rapid alternating movements normal in all extremities. Finger-to-nose and heel-to shin performed accurately bilaterally. Romberg negative.  Gait and Station: Arises from chair without difficulty. Stance is normal. Gait demonstrates normal stride length and balance. Able to heel, toe and tandem walk without difficulty.  Reflexes: Diminished and symmetric. Toes downgoing.   Assessment and Plan Illiana is a 18 year old young woman with history of mixed muscle contraction and migraine headaches. She is taking and tolerating Topiramate for prevention of migraines, but continues to experience 2-4 migraines per month. In addition, Thersea is experiencing daily tension headaches that require treatment. We talked at some length today about tension headaches and stress management techniques. Joey would likely benefit from biofeedback training, massage therapy and exercise such as yoga. I will refer her for the biofeedback and massage therapies. I also talked with her about adding Atenolol to see if that would help with headache prevention. She will have to  check with her coach to see if it is acceptable for her to take. We talked about other medication options if she would not be allowed to take Atenolol. Depakote may help prevent her headaches, but I am concerned about it since she is a young woman of child bearing age and she is concerned about the potential for weight gain. Verapamil might be another option, but again, as she is an athlete, we would have to proceed with caution. I will wait to hear from her about the Atenolol and see how her next headache diary looks. I will see Machele back in follow up in 3 months or sooner if needed, but will also be in touch with her by phone in the interim.

## 2014-02-06 ENCOUNTER — Ambulatory Visit: Payer: BC Managed Care – PPO | Admitting: Family

## 2014-02-14 ENCOUNTER — Telehealth: Payer: Self-pay | Admitting: Pediatrics

## 2014-02-14 NOTE — Telephone Encounter (Signed)
I'm not certain that there is a reason to change her medication.  I would like to talk with you tomorrow before we call her.

## 2014-02-14 NOTE — Telephone Encounter (Signed)
Headache calendar from May 2015 on [place]. 29 days were recorded.  3 days were headache free.  23 days were associated with tension type headaches, 9 required treatment.  There were 3 days of migraines, 3 were severe.

## 2014-02-16 NOTE — Telephone Encounter (Signed)
I attempted to call Melissa Boyd about her headache diary but received a message that her phone was not accepting calls. I will try again on Monday 02/19/14. TG

## 2014-02-22 ENCOUNTER — Telehealth: Payer: Self-pay | Admitting: *Deleted

## 2014-02-22 NOTE — Telephone Encounter (Signed)
Melissa Boyd, the patient, called about the referral to biofeedback training from her last visit with [name]. She wanted to know the status of the referral. The patient did not get a call about an appointemnt. The patient can be reached at [phone].

## 2014-02-22 NOTE — Telephone Encounter (Signed)
The referral was sent after her visit in May but I sent it again today. I called Melissa Boyd and left a message to her know, as well as gave her the phone number so that she could call and schedule an appointment. I invited her to call back if she has questions. TG

## 2014-02-22 NOTE — Telephone Encounter (Signed)
She is working out with her trainer this summer. She has not yet started biofeedback as we discussed but is interested in doing so. I resent the referral for that. TG

## 2014-03-06 ENCOUNTER — Other Ambulatory Visit: Payer: Self-pay | Admitting: Family

## 2014-03-19 ENCOUNTER — Telehealth: Payer: Self-pay | Admitting: Pediatrics

## 2014-03-19 NOTE — Telephone Encounter (Signed)
Headache calendar from June 2015 on [place]. 30 days were recorded.  3 days were headache free.  25 days were associated with tension type headaches, 7 required treatment.  There were 2 days of migraines, 1 was severe.  There is no reason to change current treatment.  Please contact the patient. She indicated that there was one day where she "felt weird", 2 days of blurry vision, one with a migraine, and one where she felt lightheaded for hours.

## 2014-03-30 NOTE — Telephone Encounter (Signed)
I called instructions to patient. She is going to biofeedback training now. TG

## 2014-04-20 ENCOUNTER — Telehealth: Payer: Self-pay | Admitting: Family

## 2014-04-20 NOTE — Telephone Encounter (Signed)
Melissa Boyd said that she needs a letter explaining why she is taking Atenolol because she is a Pharmacist, hospitalcollege athlete and the medication is on the [employer] banned substance list under beta blockers. She asked to be called when the letter is ready to be picked up. TG

## 2014-04-20 NOTE — Telephone Encounter (Signed)
The letter is written and ready for Dr Hickling's signature. I will call [name] to pick it up when signed. TG

## 2014-04-20 NOTE — Telephone Encounter (Signed)
I called [place] to pick up the note. TG

## 2014-04-30 ENCOUNTER — Telehealth: Payer: Self-pay | Admitting: Pediatrics

## 2014-04-30 NOTE — Telephone Encounter (Signed)
Headache calendar from July 2015 on [place]. 31 days were recorded.  5 days were headache free.  23 days were associated with tension type headaches, 8 required treatment.  There were 3 days of migraines, 3 were severe.  We can increase topiramate, but I'm not certain I want to increase the dose because I suspect that all these days were associated with performance in extreme heat.  Call Melissa Boyd and let me know what you think.

## 2014-05-07 ENCOUNTER — Ambulatory Visit: Payer: BC Managed Care – PPO | Admitting: Family

## 2014-05-10 NOTE — Telephone Encounter (Signed)
I was finally able to reach [place] by phone. She said that after the last treatment at [facility], she had had fewer headaches, and she does not want to make changes in her treatment plan at this time. I asked her to let me know if her headaches worsened and to continue to send in headache diaries. TG

## 2014-05-22 ENCOUNTER — Encounter: Payer: Self-pay | Admitting: Family

## 2014-05-24 ENCOUNTER — Telehealth: Payer: Self-pay | Admitting: Pediatrics

## 2014-05-24 NOTE — Telephone Encounter (Signed)
Headache calendar from August 2015 on [place]. 31 days were recorded.  5 days were headache free.  26 days were associated with tension type headaches, 9 required treatment.  There was one day of blurred vision without headache the patient took sumatriptan at the onset of her symptoms which then subsided.  She believed that eating a hotdog caused another headache. There is no reason to change current treatment.  Please contact the family.

## 2014-05-25 NOTE — Telephone Encounter (Signed)
I spoke with Melissa Boyd the patient's mom informing her that Dr. Sharene Skeans has reviewed [facility] August diary and there's no need to make any changes and a reminder to send in September when complete, mom agreed. MB

## 2014-06-22 ENCOUNTER — Other Ambulatory Visit: Payer: Self-pay | Admitting: Family

## 2014-06-27 ENCOUNTER — Telehealth: Payer: Self-pay | Admitting: Pediatrics

## 2014-06-27 NOTE — Telephone Encounter (Signed)
Headache calendar from September 2015 on [place]. 30 days were recorded.  13 days were headache free.  16 days were associated with tension type headaches, none required treatment.  There was 1 days of migraines, none were severe.  There were 3 migraine variants associated with blurred vision for brief periods these occurred without headaches.  There is no reason to change current treatment.  Please contact the family.

## 2014-06-27 NOTE — Telephone Encounter (Signed)
I left a message on voicemail of [name] the patient's mom informing her that Dr. Sharene SkeansHickling [facility] and there's no need to make any changes and a reminder to send in October when complete and if she has any questions to call the office. MB

## 2014-07-22 ENCOUNTER — Telehealth: Payer: Self-pay | Admitting: Pediatrics

## 2014-07-22 NOTE — Telephone Encounter (Signed)
Headache calendar from October 2015 on [name]. 31 days were recorded.  7 days were headache free.  24 days were associated with tension type headaches, 6 required treatment.  There is no reason to change current treatment.  Please contact the patient.

## 2014-07-23 NOTE — Telephone Encounter (Signed)
I left a message on voicemail of [name] the patient's mom informing her that Dr. Sh[facility]ickling [facility] and there's no need to make any changes, a reminder to send in November when complete and to call the office if she has any questions. MB

## 2014-08-02 ENCOUNTER — Emergency Department: Payer: Self-pay | Admitting: Emergency Medicine

## 2014-08-02 LAB — CBC
HCT: 39.5 % (ref 35.0–47.0)
HGB: 13.1 g/dL (ref 12.0–16.0)
MCH: 33.9 pg (ref 26.0–34.0)
MCHC: 33.2 g/dL (ref 32.0–36.0)
MCV: 102 fL — AB (ref 80–100)
Platelet: 195 10*3/uL (ref 150–440)
RBC: 3.86 10*6/uL (ref 3.80–5.20)
RDW: 12.5 % (ref 11.5–14.5)
WBC: 6.5 10*3/uL (ref 3.6–11.0)

## 2014-08-02 LAB — BASIC METABOLIC PANEL
ANION GAP: 7 (ref 7–16)
BUN: 15 mg/dL (ref 7–18)
CALCIUM: 8 mg/dL — AB (ref 8.5–10.1)
CHLORIDE: 109 mmol/L — AB (ref 98–107)
Co2: 24 mmol/L (ref 21–32)
Creatinine: 1.05 mg/dL (ref 0.60–1.30)
EGFR (African American): >60
EGFR (Non-African Amer.): >60
GLUCOSE: 70 mg/dL (ref 65–99)
Osmolality: 279 (ref 275–301)
POTASSIUM: 3.8 mmol/L (ref 3.5–5.1)
Sodium: 140 mmol/L (ref 136–145)

## 2014-08-02 LAB — TROPONIN I: Troponin-I: <0.02 ng/mL

## 2014-08-02 LAB — D-DIMER(ARMC): D-Dimer: 588 ng/ml

## 2014-08-07 DIAGNOSIS — I471 Supraventricular tachycardia: Secondary | ICD-10-CM | POA: Insufficient documentation

## 2014-08-07 DIAGNOSIS — R0681 Apnea, not elsewhere classified: Secondary | ICD-10-CM | POA: Insufficient documentation

## 2014-08-07 DIAGNOSIS — R002 Palpitations: Secondary | ICD-10-CM | POA: Insufficient documentation

## 2014-08-23 ENCOUNTER — Telehealth: Payer: Self-pay | Admitting: Pediatrics

## 2014-08-23 NOTE — Telephone Encounter (Signed)
[place] from [place] 2015 [place]. 30 days were recorded.  10 days were headache free.  18 days were associated with tension type headaches, 5 required treatment.  There were 2 days of migraines, 1 was severe.  There is no reason to change current treatment.  Please contact the family.

## 2014-08-24 NOTE — Telephone Encounter (Signed)
I left a message on voicemail of Melissa BallRobin Melissa patient's mom informing her that Dr. Sharene SkeansHickling has reviewed Melissa Boyd's November diary and there's no need to make any changes, a reminder to send in December when complete and to call Melissa office if she has any questions. MB

## 2014-09-09 ENCOUNTER — Other Ambulatory Visit: Payer: Self-pay | Admitting: Family

## 2014-09-18 ENCOUNTER — Telehealth: Payer: Self-pay | Admitting: Pediatrics

## 2014-09-18 NOTE — Telephone Encounter (Signed)
[place] from December 2015 [place]. 30 days were recorded. One day was not recorded.  14 days were headache free.  13 days were associated with tension type headaches, 3 required treatment.  There were 3 days of migraines, 2 were severe.  There is no reason to change current treatment.  Please contact the family.

## 2014-10-12 NOTE — Telephone Encounter (Signed)
I have been unable to reach Melissa [place] by phone. I have left message but she has not returned phone call. TG

## 2014-10-28 ENCOUNTER — Emergency Department (HOSPITAL_COMMUNITY)
Admission: EM | Admit: 2014-10-28 | Discharge: 2014-10-28 | Disposition: A | Discharge status: Home / Self Care | Payer: BLUE CROSS/BLUE SHIELD | Attending: Emergency Medicine | Admitting: Emergency Medicine

## 2014-10-28 ENCOUNTER — Encounter (HOSPITAL_COMMUNITY): Payer: Self-pay

## 2014-10-28 DIAGNOSIS — M79605 Pain in left leg: Secondary | ICD-10-CM

## 2014-10-28 DIAGNOSIS — Z79899 Other long term (current) drug therapy: Secondary | ICD-10-CM | POA: Insufficient documentation

## 2014-10-28 DIAGNOSIS — I82402 Acute embolism and thrombosis of unspecified deep veins of left lower extremity: Secondary | ICD-10-CM

## 2014-10-28 DIAGNOSIS — I82492 Acute embolism and thrombosis of other specified deep vein of left lower extremity: Secondary | ICD-10-CM | POA: Diagnosis not present

## 2014-10-28 DIAGNOSIS — G43909 Migraine, unspecified, not intractable, without status migrainosus: Secondary | ICD-10-CM | POA: Insufficient documentation

## 2014-10-28 LAB — CBC WITH DIFFERENTIAL/PLATELET
Basophils Absolute: 0 10*3/uL (ref 0.0–0.1)
Basophils Relative: 1 % (ref 0–1)
EOS ABS: 0.1 10*3/uL (ref 0.0–0.7)
Eosinophils Relative: 1 % (ref 0–5)
HEMATOCRIT: 40.6 % (ref 36.0–46.0)
HEMOGLOBIN: 14 g/dL (ref 12.0–15.0)
LYMPHS ABS: 2.6 10*3/uL (ref 0.7–4.0)
Lymphocytes Relative: 45 % (ref 12–46)
MCH: 34.1 pg — AB (ref 26.0–34.0)
MCHC: 34.5 g/dL (ref 30.0–36.0)
MCV: 98.8 fL (ref 78.0–100.0)
MONO ABS: 0.4 10*3/uL (ref 0.1–1.0)
Monocytes Relative: 7 % (ref 3–12)
Neutro Abs: 2.7 10*3/uL (ref 1.7–7.7)
Neutrophils Relative %: 46 % (ref 43–77)
PLATELETS: 190 10*3/uL (ref 150–400)
RBC: 4.11 MIL/uL (ref 3.87–5.11)
RDW: 12.1 % (ref 11.5–15.5)
WBC: 5.7 10*3/uL (ref 4.0–10.5)

## 2014-10-28 LAB — ANTITHROMBIN III: AntiThromb III Func: 102 % (ref 75–120)

## 2014-10-28 LAB — I-STAT CHEM 8, ED
BUN: 15 mg/dL (ref 6–23)
CREATININE: 1.2 mg/dL — AB (ref 0.50–1.10)
Calcium, Ion: 1.23 mmol/L (ref 1.12–1.23)
Chloride: 107 mmol/L (ref 96–112)
Glucose, Bld: 78 mg/dL (ref 70–99)
HEMATOCRIT: 44 % (ref 36.0–46.0)
Hemoglobin: 15 g/dL (ref 12.0–15.0)
POTASSIUM: 4 mmol/L (ref 3.5–5.1)
SODIUM: 141 mmol/L (ref 135–145)
TCO2: 19 mmol/L (ref 0–100)

## 2014-10-28 LAB — PROTIME-INR
INR: 1.14 (ref 0.00–1.49)
Prothrombin Time: 14.8 seconds (ref 11.6–15.2)

## 2014-10-28 LAB — APTT: APTT: 24 s (ref 24–37)

## 2014-10-28 MED ORDER — XARELTO VTE STARTER PACK 15 & 20 MG PO TBPK: 15.0000 mg | ORAL_TABLET | ORAL | Status: DC

## 2014-10-28 NOTE — Progress Notes (Signed)
*  Preliminary Results* Bilateral lower extremity venous duplex completed. The right lower extremity is negative for deep vein thrombosis. The left lower extremity is positive for deep vein thrombosis involving the left gastrocnemius, posterior tibial, and peroneal veins.  Preliminary results discussed with Teressa LowerVrinda Pickering, NP.  10/28/2014  Gertie FeyMichelle Nycere Presley, RVT, RDCS, RDMS

## 2014-10-28 NOTE — ED Provider Notes (Signed)
CSN: [id number]     Arrival date & time 10/28/14  1412 History  This chart was scribed for non-physician practitioner, Teressa LowerVrinda Dorr Perrot, FNP,working with Glynn OctaveStephen Rancour, MD, by Karle PlumberJennifer Tensley, ED Scribe. This patient was seen in room WTR6/WTR6 and the patient's care was started at 2:31 PM.  Chief Complaint  Patient presents with  . Leg Pain   The history is provided by the patient and medical records. No language interpreter was used.    HPI Comments:  Melissa Boyd is a 19 y.o. female who presents to the Emergency Department complaining of left leg stiffness for a week and cramping that began last night. She [place]. Pt states she took an Aleve last night for the pain. She denies any modifying factors. She reports taking Imitrex and Topirimate daily for migraines and birth control pills daily. Denies fever, chills, nausea, vomiting, redness or warmth of the leg. PMHx of migraines.  Past Medical History  Diagnosis Date  . Headache, migraine    Past Surgical History  Procedure Laterality Date  . Wisdom tooth extraction    . Shoulder surgery      left   Family History  Problem Relation Age of Onset  . Anemia Father     low iron   . Hypertension Maternal Aunt   . Alzheimer's disease Paternal Grandfather    History  Substance Use Topics  . Smoking status: Never Smoker   . Smokeless tobacco: Never Used  . Alcohol Use: No   OB History    Gravida Para Term Preterm AB TAB SAB Ectopic Multiple Living   0              Review of Systems  Constitutional: Negative for fever and chills.  Gastrointestinal: Negative for nausea and vomiting.  Musculoskeletal: Positive for myalgias.  Skin: Negative for color change and wound.  All other systems reviewed and are negative.   Allergies  Shellfish allergy and Naproxen  Home Medications   Prior to Admission medications   Medication Sig Start Date End Date Taking? Authorizing  Provider  atenolol (TENORMIN) 25 MG tablet Take 1/2 tablet at bedtime for 1 week, then increase to 1 tablet at bedtime 02/02/14   Elveria Risingina Goodpasture, NP  azithromycin (ZITHROMAX) 250 MG tablet Take 250 mg by mouth as directed.  10/25/13   Historical Provider, MD  benzonatate (TESSALON) 100 MG capsule Take 1 capsule (100 mg total) by mouth every 8 (eight) hours. 10/27/13   Robyn M Hess, PA-C  EPIPEN 2-PAK 0.3 MG/0.3ML SOAJ injection Inject 0.3 mg into the muscle once.  09/29/13   Historical Provider, MD  guaiFENesin (MUCINEX) 600 MG 12 hr tablet Take 600 mg by mouth 2 (two) times daily as needed.    Historical Provider, MD  loperamide (IMODIUM) 2 MG capsule Take 2 mg by mouth as needed for diarrhea or loose stools.    Historical Provider, MD  Multiple Vitamins-Minerals (ONE-A-DAY WOMENS VITACRAVES) CHEW Chew by mouth.    Historical Provider, MD  Norethindrone-Ethinyl Estradiol-Fe Biphas (LO LOESTRIN FE) 1 MG-10 MCG / 10 MCG tablet Take 1 tablet by mouth once. 07/22/12   Jaymes GraffNaima Dillard, MD  ondansetron (ZOFRAN ODT) 4 MG disintegrating tablet 4mg  ODT q4 hours prn nausea/vomit 10/27/13   Robyn M Hess, PA-C  SUMAtriptan (IMITREX) 20 MG/ACT nasal spray ONE SPRAY INTO NOSE AT ONSET OF MIGRAINE, AND MAY REPEAT IN 2 HOURS IF HEADACHE PERSISTS 09/10/14   Chrissie NoaWilliam  Rod Can, MD  topiramate (TOPAMAX) 25 MG tablet Take 3 tablets at bedtime 10/13/13   Elveria Rising, NP  topiramate (TOPAMAX) 25 MG tablet TAKE 3 TABLETS BY MOUTH AT BEDTIME 06/25/14   Elveria Rising, NP   Triage Vitals: BP 135/70 mmHg  Pulse 57  Temp(Src) 98.3 F (36.8 C) (Oral)  Resp 16  SpO2 100%  LMP 09/23/2014 (Approximate) Physical Exam  Constitutional: She is oriented to person, place, and time. She appears well-developed and well-nourished.  HENT:  Head: Normocephalic and atraumatic.  Right Ear: External ear normal.  Left Ear: External ear normal.  Eyes: EOM are normal.  Neck: Normal range of motion.  Cardiovascular: Normal rate.    Pulmonary/Chest: Effort normal and breath sounds normal.  Abdominal: Bowel sounds are normal. There is no rebound.  Musculoskeletal: Normal range of motion.  Mild swelling noted to left calf. +homan sign. No redness or warmth noted. Full rom. Good flexion and extension of the foot  Neurological: She is alert and oriented to person, place, and time.  Skin: Skin is warm and dry.  Psychiatric: She has a normal mood and affect. Her behavior is normal.  Nursing note and vitals reviewed.   ED Course  Procedures (including critical care time) DIAGNOSTIC STUDIES: Oxygen Saturation is 100% on RA, normal by my interpretation.   COORDINATION OF CARE: 2:35 PM- Will order Doppler study. Pt verbalizes understanding and agrees to plan.  Medications - No data to display  Labs Review Labs Reviewed  CBC WITH DIFFERENTIAL/PLATELET - Abnormal; Notable for the following:    MCH 34.1 (*)    All other components within normal limits  I-STAT CHEM 8, ED - Abnormal; Notable for the following:    Creatinine, Ser 1.20 (*)    All other components within normal limits  PROTIME-INR  APTT  ANTITHROMBIN III  PROTEIN C ACTIVITY  PROTEIN C, TOTAL  PROTEIN S ACTIVITY  PROTEIN S, TOTAL  LUPUS ANTICOAGULANT PANEL  BETA-2-GLYCOPROTEIN I ABS, IGG/M/A  HOMOCYSTEINE  FACTOR 5 LEIDEN  PROTHROMBIN GENE MUTATION  CARDIOLIPIN ANTIBODIES, IGG, IGM, IGA    Imaging Review No results found.   EKG Interpretation None      MDM   Final diagnoses:  DVT (deep venous thrombosis), left    Spoke with Dr. Shirline Frees. And pt is to stop birth control. Labs drawn as recommended. Discussed return precautions and stopping nsaid. Pt not having any cp or sob.pt given follow up with hematology.   I personally performed the services described in this documentation, which was scribed in my presence. The recorded information has been reviewed and is accurate.    Teressa Lower, NP 10/28/14 1706  Glynn Octave,  MD 10/28/14 1714

## 2014-10-28 NOTE — Discharge Instructions (Signed)
As discussed you need to stop your birthcontrol. If you develop sob or chest pain you need to be re seen. dont take naproxen or ibuprofen. You can take tylenol Deep Vein Thrombosis A deep vein thrombosis (DVT) is a blood clot that develops in the deep, larger veins of the leg, arm, or pelvis. These are more dangerous than clots that might form in veins near the surface of the body. A DVT can lead to serious and even life-threatening complications if the clot breaks off and travels in the bloodstream to the lungs.  A DVT can damage the valves in your leg veins so that instead of flowing upward, the blood pools in the lower leg. This is called post-thrombotic syndrome, and it can result in pain, swelling, discoloration, and sores on the leg. CAUSES Usually, several things contribute to the formation of blood clots. Contributing factors include:  The flow of blood slows down.  The inside of the vein is damaged in some way.  You have a condition that makes blood clot more easily. RISK FACTORS Some people are more likely than others to develop blood clots. Risk factors include:   Smoking.  Being overweight (obese).  Sitting or lying still for a long time. This includes long-distance travel, paralysis, or recovery from an illness or surgery. Other factors that increase risk are:   Older age, especially over 64 years of age.  Having a family history of blood clots or if you have already had a blot clot.  Having major or lengthy surgery. This is especially true for surgery on the hip, knee, or belly (abdomen). Hip surgery is particularly high risk.  Having a long, thin tube (catheter) placed inside a vein during a medical procedure.  Breaking a hip or leg.  Having cancer or cancer treatment.  Pregnancy and childbirth.  Hormone changes make the blood clot more easily during pregnancy.  The fetus puts pressure on the veins of the pelvis.  There is a risk of injury to veins during  delivery or a caesarean delivery. The risk is highest just after childbirth.  Medicines containing the female hormone estrogen. This includes birth control pills and hormone replacement therapy.  Other circulation or heart problems.  SIGNS AND SYMPTOMS When a clot forms, it can either partially or totally block the blood flow in that vein. Symptoms of a DVT can include:  Swelling of the leg or arm, especially if one side is much worse.  Warmth and redness of the leg or arm, especially if one side is much worse.  Pain in an arm or leg. If the clot is in the leg, symptoms may be more noticeable or worse when standing or walking. The symptoms of a DVT that has traveled to the lungs (pulmonary embolism, PE) usually start suddenly and include:  Shortness of breath.  Coughing.  Coughing up blood or blood-tinged mucus.  Chest pain. The chest pain is often worse with deep breaths.  Rapid heartbeat. Anyone with these symptoms should get emergency medical treatment right away. Do not wait to see if the symptoms will go away. Call your local emergency services (911 in the [place] if you have these symptoms. Do not drive yourself to the hospital. DIAGNOSIS If a DVT is suspected, your health care provider will take a full medical history and perform a physical exam. Tests that also may be required include:  Blood tests, including studies of the clotting properties of the blood.  Ultrasound to see if you have clots  in your legs or lungs.  X-rays to show the flow of blood when dye is injected into the veins (venogram).  Studies of your lungs if you have any chest symptoms. PREVENTION  Exercise the legs regularly. Take a brisk 30-minute walk every day.  Maintain a weight that is appropriate for your height.  Avoid sitting or lying in bed for long periods of time without moving your legs.  Women, particularly those over the age of 35 years, should consider the risks and benefits of taking  estrogen medicines, including birth control pills.  Do not smoke, especially if you take estrogen medicines.  Long-distance travel can increase your risk of DVT. You should exercise your legs by walking or pumping the muscles every hour.  Many of the risk factors above relate to situations that exist with hospitalization, either for illness, injury, or elective surgery. Prevention may include medical and nonmedical measures.  Your health care provider will assess you for the need for venous thromboembolism prevention when you are admitted to the hospital. If you are having surgery, your surgeon will assess you the day of or day after surgery. TREATMENT Once identified, a DVT can be treated. It can also be prevented in some circumstances. Once you have had a DVT, you may be at increased risk for a DVT in the future. The most common treatment for DVT is blood-thinning (anticoagulant) medicine, which reduces the blood's tendency to clot. Anticoagulants can stop new blood clots from forming and stop old clots from growing. They cannot dissolve existing clots. Your body does this by itself over time. Anticoagulants can be given by mouth, through an IV tube, or by injection. Your health care provider will determine the best program for you. Other medicines or treatments that may be used are:  Heparin or related medicines (low molecular weight heparin) are often the first treatment for a blood clot. They act quickly. However, they cannot be taken orally and must be given either in shot form or by IV tube.  Heparin can cause a fall in a component of blood that stops bleeding and forms blood clots (platelets). You will be monitored with blood tests to be sure this does not occur.  Warfarin is an anticoagulant that can be swallowed. It takes a few days to start working, so usually heparin or related medicines are used in combination. Once warfarin is working, heparin is usually stopped.  Factor Xa inhibitor  medicines, such as rivaroxaban and apixaban, also reduce blood clotting. These medicines are taken orally and can often be used without heparin or related medicines.  Less commonly, clot dissolving drugs (thrombolytics) are used to dissolve a DVT. They carry a high risk of bleeding, so they are used mainly in severe cases where your life or a part of your body is threatened.  Very rarely, a blood clot in the leg needs to be removed surgically.  If you are unable to take anticoagulants, your health care provider may arrange for you to have a filter placed in a main vein in your abdomen. This filter prevents clots from traveling to your lungs. HOME CARE INSTRUCTIONS  Take all medicines as directed by your health care provider.  Learn as much as you can about DVT.  Wear a medical alert bracelet or carry a medical alert card.  Ask your health care provider how soon you can go back to normal activities. It is important to stay active to prevent blood clots. If you are on anticoagulant medicine, avoid  contact sports.  It is very important to exercise. This is especially important while traveling, sitting, or standing for long periods of time. Exercise your legs by walking or by tightening and relaxing your leg muscles regularly. Take frequent walks.  You may need to wear compression stockings. These are tight elastic stockings that apply pressure to the lower legs. This pressure can help keep the blood in the legs from clotting. Taking Warfarin Warfarin is a daily medicine that is taken by mouth. Your health care provider will advise you on the length of treatment (usually 3-6 months, sometimes lifelong). If you take warfarin:  Understand how to take warfarin and foods that can affect how warfarin works in Public relations account executiveyour body.  Too much and too little warfarin are both dangerous. Too much warfarin increases the risk of bleeding. Too little warfarin continues to allow the risk for blood clots. Warfarin and  Regular Blood Testing While taking warfarin, you will need to have regular blood tests to measure your blood clotting time. These blood tests usually include both the prothrombin time (PT) and international normalized ratio (INR) tests. The PT and INR results allow your health care provider to adjust your dose of warfarin. It is very important that you have your PT and INR tested as often as directed by your health care provider.  Warfarin and Your Diet Avoid major changes in your diet, or notify your health care provider before changing your diet. Arrange a visit with a registered dietitian to answer your questions. Many foods, especially foods high in vitamin K, can interfere with warfarin and affect the PT and INR results. You should eat a consistent amount of foods high in vitamin K. Foods high in vitamin K include:   Spinach, kale, broccoli, cabbage, collard and turnip greens, Brussels sprouts, peas, cauliflower, seaweed, and parsley.  Beef and pork liver.  Green tea.  Soybean oil. Warfarin with Other Medicines Many medicines can interfere with warfarin and affect the PT and INR results. You must:  Tell your health care provider about any and all medicines, vitamins, and supplements you take, including aspirin and other over-the-counter anti-inflammatory medicines. Be especially cautious with aspirin and anti-inflammatory medicines. Ask your health care provider before taking these.  Do not take or discontinue any prescribed or over-the-counter medicine except on the advice of your health care provider or pharmacist. Warfarin Side Effects Warfarin can have side effects, such as easy bruising and difficulty stopping bleeding. Ask your health care provider or pharmacist about other side effects of warfarin. You will need to:  Hold pressure over cuts for longer than usual.  Notify your dentist and other health care providers that you are taking warfarin before you undergo any procedures  where bleeding may occur. Warfarin with Alcohol and Tobacco   Drinking alcohol frequently can increase the effect of warfarin, leading to excess bleeding. It is best to avoid alcoholic drinks or to consume only very small amounts while taking warfarin. Notify your health care provider if you change your alcohol intake.   Do not use any tobacco products including cigarettes, chewing tobacco, or electronic cigarettes. If you smoke, quit. Ask your health care provider for help with quitting smoking. Alternative Medicines to Warfarin: Factor Xa Inhibitor Medicines  These blood-thinning medicines are taken by mouth, usually for several weeks or longer. It is important to take the medicine every single day at the same time each day.  There are no regular blood tests required when using these medicines.  There are  fewer food and drug interactions than with warfarin.  The side effects of this class of medicine are similar to those of warfarin, including excessive bruising or bleeding. Ask your health care provider or pharmacist about other potential side effects. SEEK MEDICAL CARE IF:  You notice a rapid heartbeat.  You feel weaker or more tired than usual.  You feel faint.  You notice increased bruising.  You feel your symptoms are not getting better in the time expected.  You believe you are having side effects of medicine. SEEK IMMEDIATE MEDICAL CARE IF:  You have chest pain.  You have trouble breathing.  You have new or increased swelling or pain in one leg.  You cough up blood.  You notice blood in vomit, in a bowel movement, or in urine. MAKE SURE YOU:  Understand these instructions.  Will watch your condition.  Will get help right away if you are not doing well or get worse. Document Released: 08/31/2005 Document Revised: 01/15/2014 Document Reviewed: 05/08/2013 [facility] Patient Information 2015 [place], [place]. This information is not intended to replace advice given  to you by your health care provider. Make sure you discuss any questions you have with your health care provider.

## 2014-10-28 NOTE — ED Notes (Addendum)
Patient presents today with a chief complaint of left lower extremity stiffness to vastus lateralis muscle with radiation down to left calf. Patient reports pain in calf became severe last night with throbbing at rest. She is on oral contraceptives and switched to another pill on the end of January 2016, denies smoking. She took Aleve last night without relief.

## 2014-10-29 ENCOUNTER — Telehealth: Payer: Self-pay | Admitting: Oncology

## 2014-10-29 DIAGNOSIS — I82409 Acute embolism and thrombosis of unspecified deep veins of unspecified lower extremity: Secondary | ICD-10-CM | POA: Insufficient documentation

## 2014-10-29 NOTE — Telephone Encounter (Signed)
S/W PTS MOTHER IN REF TO NP APPT ON 11/13/14@1 :30 DX- DVT REFERRING DR Manus GunningANCOUR

## 2014-10-31 ENCOUNTER — Emergency Department (HOSPITAL_COMMUNITY): Payer: BLUE CROSS/BLUE SHIELD

## 2014-10-31 ENCOUNTER — Encounter (HOSPITAL_COMMUNITY): Payer: Self-pay | Admitting: Emergency Medicine

## 2014-10-31 ENCOUNTER — Emergency Department (HOSPITAL_COMMUNITY)
Admission: EM | Admit: 2014-10-31 | Discharge: 2014-10-31 | Disposition: A | Discharge status: Home / Self Care | Payer: BLUE CROSS/BLUE SHIELD | Attending: Emergency Medicine | Admitting: Emergency Medicine

## 2014-10-31 DIAGNOSIS — R0789 Other chest pain: Secondary | ICD-10-CM | POA: Insufficient documentation

## 2014-10-31 DIAGNOSIS — Z3202 Encounter for pregnancy test, result negative: Secondary | ICD-10-CM | POA: Diagnosis not present

## 2014-10-31 DIAGNOSIS — R079 Chest pain, unspecified: Secondary | ICD-10-CM | POA: Diagnosis present

## 2014-10-31 DIAGNOSIS — G43909 Migraine, unspecified, not intractable, without status migrainosus: Secondary | ICD-10-CM | POA: Diagnosis not present

## 2014-10-31 DIAGNOSIS — Z793 Long term (current) use of hormonal contraceptives: Secondary | ICD-10-CM | POA: Diagnosis not present

## 2014-10-31 DIAGNOSIS — Z7901 Long term (current) use of anticoagulants: Secondary | ICD-10-CM | POA: Insufficient documentation

## 2014-10-31 DIAGNOSIS — Z86718 Personal history of other venous thrombosis and embolism: Secondary | ICD-10-CM | POA: Insufficient documentation

## 2014-10-31 DIAGNOSIS — Z79899 Other long term (current) drug therapy: Secondary | ICD-10-CM | POA: Insufficient documentation

## 2014-10-31 LAB — CARDIOLIPIN ANTIBODIES, IGG, IGM, IGA: Anticardiolipin IgA: <9 APL U/mL (ref 0–11)

## 2014-10-31 LAB — PROTHROMBIN GENE MUTATION

## 2014-10-31 LAB — CBC
HCT: 39.2 % (ref 36.0–46.0)
HEMOGLOBIN: 13.1 g/dL (ref 12.0–15.0)
MCH: 33.2 pg (ref 26.0–34.0)
MCHC: 33.4 g/dL (ref 30.0–36.0)
MCV: 99.5 fL (ref 78.0–100.0)
PLATELETS: 193 10*3/uL (ref 150–400)
RBC: 3.94 MIL/uL (ref 3.87–5.11)
RDW: 11.9 % (ref 11.5–15.5)
WBC: 6.1 10*3/uL (ref 4.0–10.5)

## 2014-10-31 LAB — LUPUS ANTICOAGULANT PANEL
DRVVT: 44.9 s (ref 0.0–55.1)
PTT Lupus Anticoagulant: 31.1 s (ref 0.0–50.0)

## 2014-10-31 LAB — BASIC METABOLIC PANEL
Anion gap: 6 (ref 5–15)
BUN: 19 mg/dL (ref 6–23)
CO2: 25 mmol/L (ref 19–32)
CREATININE: 1.03 mg/dL (ref 0.50–1.10)
Calcium: 8.6 mg/dL (ref 8.4–10.5)
Chloride: 110 mmol/L (ref 96–112)
GFR calc non Af Amer: 77 mL/min — ABNORMAL LOW (ref >90)
GFR, EST AFRICAN AMERICAN: 89 mL/min — AB (ref >90)
Glucose, Bld: 67 mg/dL — ABNORMAL LOW (ref 70–99)
POTASSIUM: 3.9 mmol/L (ref 3.5–5.1)
SODIUM: 141 mmol/L (ref 135–145)

## 2014-10-31 LAB — FACTOR 5 LEIDEN

## 2014-10-31 LAB — POC URINE PREG, ED: Preg Test, Ur: NEGATIVE

## 2014-10-31 LAB — PROTEIN C, TOTAL: PROTEIN C, TOTAL: 75 % (ref 70–140)

## 2014-10-31 LAB — PROTEIN S ACTIVITY: Protein S Activity: 78 % (ref 60–145)

## 2014-10-31 LAB — I-STAT TROPONIN, ED: Troponin i, poc: 0 ng/mL (ref 0.00–0.08)

## 2014-10-31 LAB — PROTEIN C ACTIVITY: PROTEIN C ACTIVITY: 111 % (ref 74–151)

## 2014-10-31 LAB — PROTEIN S, TOTAL: Protein S Ag, Total: 81 % (ref 58–150)

## 2014-10-31 MED ORDER — HYDROCODONE-ACETAMINOPHEN 5-325 MG PO TABS
1.0000 | ORAL_TABLET | Freq: Once | ORAL | Status: AC
  Administered 2014-10-31: 1 via ORAL
  Filled 2014-10-31: qty 1

## 2014-10-31 MED ORDER — IOHEXOL 350 MG/ML SOLN
100.0000 mL | Freq: Once | INTRAVENOUS | Status: AC | PRN
  Administered 2014-10-31: 100 mL via INTRAVENOUS

## 2014-10-31 MED ORDER — HYDROCODONE-ACETAMINOPHEN 5-325 MG PO TABS
1.0000 | ORAL_TABLET | Freq: Four times a day (QID) | ORAL | Status: DC | PRN
Start: 2014-10-31 — End: 2014-11-13

## 2014-10-31 NOTE — ED Notes (Signed)
Patient transported to CT 

## 2014-10-31 NOTE — ED Notes (Signed)
Pt denies and SHOB, but now stating that she is having trouble feeling her left foot.

## 2014-10-31 NOTE — Discharge Instructions (Signed)
Follow-up with your primary care Dr. for recheck.  Return here as needed.  Your CT scan did not show any signs of pulmonary embolus

## 2014-10-31 NOTE — ED Notes (Signed)
Pt was seen here on Sunday and diagnosed with DVT in LLE. Pt states that today started having central chest pain and upper left thigh pain.  Pt states that she is taking Xeralto.  Pt states that she had vaginal bleeding yesterday, but doesn't know if related to her having to stop her birth control.

## 2014-10-31 NOTE — ED Provider Notes (Signed)
CSN: [id number]     Arrival date & time 10/31/14  1818 History   First MD Initiated Contact with Patient 10/31/14 2022     Chief Complaint  Patient presents with  . Chest Pain  . DVT     (Consider location/radiation/quality/duration/timing/severity/associated sxs/prior Treatment) HPI  Patient presents to the emergency department with mid sternal chest pain that started at 5:00 today.  Patient states that she has not had any shortness of breath, hemoptysis, nausea, vomiting, abdominal pain, weakness, dizziness, back pain, neck pain, fever, cough, or syncope.  The patient states that she did not take any medications prior to arrival for her symptoms.  She is currently on Xarelto for a DVT Past Medical History  Diagnosis Date  . Headache, migraine    Past Surgical History  Procedure Laterality Date  . Wisdom tooth extraction    . Shoulder surgery      left   Family History  Problem Relation Age of Onset  . Anemia Father     low iron   . Hypertension Maternal Aunt   . Alzheimer's disease Paternal Grandfather    History  Substance Use Topics  . Smoking status: Never Smoker   . Smokeless tobacco: Never Used  . Alcohol Use: No   OB History    Gravida Para Term Preterm AB TAB SAB Ectopic Multiple Living   0              Review of Systems  All other systems negative except as documented in the HPI. All pertinent positives and negatives as reviewed in the HPI.   Allergies  Shellfish allergy and Naproxen  Home Medications   Prior to Admission medications   Medication Sig Start Date End Date Taking? Authorizing Provider  acetaminophen (TYLENOL) 500 MG tablet Take 1,000 mg by mouth every 6 (six) hours as needed for moderate pain.   Yes Historical Provider, MD  bismuth subsalicylate (PEPTO BISMOL) 262 MG/15ML suspension Take 30 mLs by mouth every 6 (six) hours as needed for indigestion.   Yes Historical Provider, MD  EPIPEN 2-PAK 0.3 MG/0.3ML SOAJ injection Inject 0.3 mg into  the muscle once.  09/29/13  Yes Historical Provider, MD  TAZORAC 0.05 % cream Apply 1 application topically every evening. 09/18/14  Yes Historical Provider, MD  topiramate (TOPAMAX) 25 MG tablet Take 3 tablets at bedtime 10/13/13  Yes Elveria Risingina Goodpasture, NP  traMADol (ULTRAM) 50 MG tablet Take 50 mg by mouth every 6 (six) hours as needed for moderate pain.   Yes Historical Provider, MD  XARELTO STARTER PACK 15 & 20 MG TBPK Take 15-20 mg by mouth as directed. Take as directed on package: Start with one 15mg  tablet by mouth twice a day with food. On Day 22, switch to one 20mg  tablet once a day with food. 10/28/14  Yes Teressa LowerVrinda Pickering, NP  atenolol (TENORMIN) 25 MG tablet Take 1/2 tablet at bedtime for 1 week, then increase to 1 tablet at bedtime Patient not taking: Reported on 10/28/2014 02/02/14   Elveria Risingina Goodpasture, NP  benzonatate (TESSALON) 100 MG capsule Take 1 capsule (100 mg total) by mouth every 8 (eight) hours. Patient not taking: Reported on 10/28/2014 10/27/13   Kathrynn Speedobyn M Hess, PA-C  Norethindrone-Ethinyl Estradiol-Fe Biphas (LO LOESTRIN FE) 1 MG-10 MCG / 10 MCG tablet Take 1 tablet by mouth once. Patient not taking: Reported on 10/28/2014 07/22/12   Jaymes GraffNaima Dillard, MD  ondansetron (ZOFRAN ODT) 4 MG disintegrating tablet 4mg  ODT q4 hours prn nausea/vomit Patient not  taking: Reported on 10/31/2014 10/27/13   Kathrynn Speed, PA-C  SUMAtriptan (IMITREX) 20 MG/ACT nasal spray ONE SPRAY INTO NOSE AT ONSET OF MIGRAINE, AND MAY REPEAT IN 2 HOURS IF HEADACHE PERSISTS Patient not taking: Reported on 10/31/2014 09/10/14   Deetta Perla, MD  topiramate (TOPAMAX) 25 MG tablet TAKE 3 TABLETS BY MOUTH AT BEDTIME Patient not taking: Reported on 10/28/2014 06/25/14   Elveria Rising, NP   BP 128/51 mmHg  Pulse 50  Temp(Src) 98.3 F (36.8 C) (Oral)  Resp 20  SpO2 100%  LMP 10/31/2014 Physical Exam  Constitutional: She is oriented to person, place, and time. She appears well-developed and well-nourished. No  distress.  HENT:  Head: Normocephalic and atraumatic.  Mouth/Throat: Oropharynx is clear and moist.  Eyes: EOM are normal. Pupils are equal, round, and reactive to light.  Neck: Normal range of motion. Neck supple.  Cardiovascular: Normal rate, regular rhythm and normal heart sounds.  Exam reveals no gallop and no friction rub.   No murmur heard. Pulmonary/Chest: Effort normal and breath sounds normal. She exhibits tenderness.  Musculoskeletal: She exhibits no edema.  Neurological: She is alert and oriented to person, place, and time. She exhibits normal muscle tone. Coordination normal.  Skin: Skin is warm and dry. No rash noted. No erythema.  Nursing note and vitals reviewed.   ED Course  Procedures (including critical care time) Labs Review Labs Reviewed  BASIC METABOLIC PANEL - Abnormal; Notable for the following:    Glucose, Bld 67 (*)    GFR calc non Af Amer 77 (*)    GFR calc Af Amer 89 (*)    All other components within normal limits  CBC  I-STAT TROPOININ, ED  POC URINE PREG, ED    Imaging Review Dg Chest 2 View  10/31/2014   CLINICAL DATA:  Right-sided chest pain.  EXAM: CHEST  2 VIEW  COMPARISON:  10/27/2013  FINDINGS: The heart size and mediastinal contours are within normal limits. Both lungs are clear. The visualized skeletal structures are unremarkable.  IMPRESSION: Normal exam.   Electronically Signed   By: Francene Boyers M.D.   On: 10/31/2014 19:11   Ct Angio Chest Pe W/cm &/or Wo Cm  10/31/2014   CLINICAL DATA:  Patient diagnosed with deep venous thrombosis 10/28/2014. Central chest pain beginning today.  EXAM: CT ANGIOGRAPHY CHEST WITH CONTRAST  TECHNIQUE: Multidetector CT imaging of the chest was performed using the standard protocol during bolus administration of intravenous contrast. Multiplanar CT image reconstructions and MIPs were obtained to evaluate the vascular anatomy.  CONTRAST:  100 mL OMNIPAQUE IOHEXOL 350 MG/ML SOLN  COMPARISON:  CT chest 08/02/2014.   FINDINGS: No pulmonary embolus is identified. Heart size is normal. No axillary, hilar or mediastinal lymphadenopathy. No pleural or pericardial effusion. The lungs are clear. Incidentally imaged upper abdomen is unremarkable. No bony abnormality is identified.  Review of the MIP images confirms the above findings.  IMPRESSION: Negative for pulmonary embolus.  Negative chest CT.   Electronically Signed   By: Drusilla Kanner M.D.   On: 10/31/2014 21:58   patient be referred back to her primary care Dr. told to return here as needed.  Will treat for chest wall pain as her CTA of her chest.  Initially any signs of pulmonary embolus.  The patient is advised to return here as needed.  The pain has been constant and is worse with some palpation.   MDM   Final diagnoses:  None  Carlyle Dolly, PA-C 10/31/14 2233  Suzi Roots, MD 11/03/14 0730

## 2014-11-01 ENCOUNTER — Ambulatory Visit: Payer: Self-pay | Admitting: Oncology

## 2014-11-01 ENCOUNTER — Telehealth: Payer: Self-pay | Admitting: Oncology

## 2014-11-01 LAB — BETA-2-GLYCOPROTEIN I ABS, IGG/M/A
Beta-2-Glycoprotein I IgA: <9 GPI IgA units (ref 0–25)
Beta-2-Glycoprotein I IgM: <9 GPI IgM units (ref 0–32)

## 2014-11-01 NOTE — Telephone Encounter (Signed)
Chart delivered 11/01/14 TG °

## 2014-11-02 LAB — HOMOCYSTEINE: HOMOCYSTEINE-NORM: 8.2 umol/L (ref 0.0–15.0)

## 2014-11-05 ENCOUNTER — Ambulatory Visit (INDEPENDENT_AMBULATORY_CARE_PROVIDER_SITE_OTHER): Payer: BLUE CROSS/BLUE SHIELD | Admitting: Pediatrics

## 2014-11-05 ENCOUNTER — Encounter: Payer: Self-pay | Admitting: Pediatrics

## 2014-11-05 ENCOUNTER — Ambulatory Visit: Payer: BLUE CROSS/BLUE SHIELD | Admitting: Family

## 2014-11-05 VITALS — BP 110/62 | HR 70 | Ht 67.25 in | Wt 168.6 lb

## 2014-11-05 DIAGNOSIS — Z86718 Personal history of other venous thrombosis and embolism: Secondary | ICD-10-CM | POA: Insufficient documentation

## 2014-11-05 DIAGNOSIS — G5702 Lesion of sciatic nerve, left lower limb: Secondary | ICD-10-CM | POA: Insufficient documentation

## 2014-11-05 DIAGNOSIS — G43109 Migraine with aura, not intractable, without status migrainosus: Secondary | ICD-10-CM

## 2014-11-05 DIAGNOSIS — G43009 Migraine without aura, not intractable, without status migrainosus: Secondary | ICD-10-CM

## 2014-11-05 DIAGNOSIS — G44219 Episodic tension-type headache, not intractable: Secondary | ICD-10-CM

## 2014-11-05 DIAGNOSIS — G5732 Lesion of lateral popliteal nerve, left lower limb: Secondary | ICD-10-CM

## 2014-11-05 DIAGNOSIS — I82402 Acute embolism and thrombosis of unspecified deep veins of left lower extremity: Secondary | ICD-10-CM

## 2014-11-05 MED ORDER — TOPIRAMATE 25 MG PO TABS: 75.0000 mg | ORAL_TABLET | Freq: Every day | ORAL | Status: DC

## 2014-11-05 NOTE — Patient Instructions (Addendum)
Melissa Boyd suffered a deep vein thrombosis that was diagnosed In mid February with Doppler.  She had a full thrombophilia workup which was negative.  She is scheduled to see hematologist in early March.  Any further workup for this will be carried out through that office.  The duration of treatment of Xarelto will be up to the hematologist.  On examination today she has a left foot drop and numbness in the distribution Of the left peroneal nerve.  I'm not certain when this occurred.  I see no connection with her deep vein thrombosis.  She is not cleared to return to activity with the track team until she has normal strength in her left leg.  This is the foot that she plants on when throwing shot put and discus, and it would not be in her best interest to attempt to complete the until she has normal strength.

## 2014-11-05 NOTE — Progress Notes (Addendum)
Patient: Melissa Boyd MRN: [id number] Sex: female DOB: 29-Nov-1995  Provider: Deetta Perla, MD Location of Care: [facility]  Note type: Routine return visit  History of Present Illness: Referral Source: Dr. Verne Carrow History from: father, patient and [facility] chart Chief Complaint: Migraines, Headaches  Melissa Boyd is a 19 y.o. female who was seen November 05, 2014, for the first time since April 2015.  She has mixed muscle contraction and migraine headaches and has taken topiramate for them.  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  She had greater problems with headaches in the past and was very heat sensitive.  She engaged in biofeedback at the suggestion of my nurse practitioner, Elveria Rising.  This helped her greatly.  In December 2015, she had two or three migraines, but in January and February 2016, she had none.  In January, 2016, she had 23 days without headaches and eight days of tension headaches that did not require treatment.  In February 2016, she had 15 days without headaches and five days of tension headaches only one required treatment.  In mid-February 2016, she took a trip to [place] for a track meet.  She was on a charter bus for 10 hours each way.  She developed pain in her left calf and felt as if it was a cramp.  She was unable to work out the cramp, but was able to compete.  On the way home, it hurt her worse.  The trainer placed her calf in a sleeve with some ice with the thought that she had somehow strained her leg.  This had happened to her hip flexor and hamstrings in 2014.  When the symptoms did not subside, her mother took her to the emergency room where she was found to have a deep vein thrombosis in the distal venous system from the popliteal fossa distally.  She developed some chest pain and had a CT scan of the chest that failed to show pulmonary embolus.  Nonetheless because of this, she was placed on Xarelto.  She  also had an extensive hypercoagulable workup all of which was negative.  I know that she is scheduled to see Dr. Tanja Port, an adult hematologist oncologist.  I am not certain that there is anything else to do.  It was mentioned that she would only need to take Xarelto for three months.  Review of Systems: 12 system review was remarkable for weakness and numbness in the left lower leg  Past Medical History Diagnosis Date  . Headache, migraine    Hospitalizations: Yes.  , Head Injury: No., Nervous System Infections: No., Immunizations up to date: Yes.    She had sports injury of strained hamstring and hip flexor, as well as strained back muscles 2014 while exercising.   Birth History 7 lbs. 12 oz. infant born to a 54 year old gravida 2 para 1001 woman.   Mother had nausea throughout the entire pregnancy.   Child was a vertex, vaginal delivery.     She did well nursery  and went home with her parents.   Growth and development was normal.  Behavior History none  Surgical History Procedure Laterality Date  . Wisdom tooth extraction    . Shoulder surgery      left   Family History family history includes Alzheimer's disease in her paternal grandfather; Anemia in her father; Hypertension in her maternal aunt. Her brother  had migraine headaches in high school.  Paternal great aunt has migraines  and 2 maternal second cousin's both of them menstrually related.  Mother has thyroid dysfunction. Family history is negative for seizures, intellectual disabilities, blindness, deafness, birth defects, chromosomal disorder, or autism.  Social History . Marital Status: Single    Spouse Name: N/A  . Number of Children: N/A  . Years of Education: N/A   Social History Main Topics  . Smoking status: Never Smoker   . Smokeless tobacco: Never Used  . Alcohol Use: No  . Drug Use: No  . 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 She is doing very well this semester.  Allergies Allergen Reactions  . Other Anaphylaxis    Any products that contain shellfish  . Shellfish Allergy Anaphylaxis  . Naproxen Nausea And Vomiting   Physical Exam BP 110/62 mmHg  Pulse 70  Ht 5' 7.25" (1.708 m)  Wt 168 lb 9.6 oz (76.476 kg)  BMI 26.21 kg/m2  LMP 10/31/2014  General: alert, well developed, well nourished, in no acute distress, black hair, brown eyes, right handed Head: normocephalic, no dysmorphic features Ears, Nose and Throat: Otoscopic: tympanic membranes normal; pharynx: oropharynx is pink without exudates or tonsillar hypertrophy Neck: supple, full range of motion, no cranial or cervical bruits Respiratory: auscultation clear Cardiovascular: no murmurs, pulses are normal Musculoskeletal: no skeletal deformities or apparent scoliosis Skin: no rashes or neurocutaneous lesions  Neurologic Exam  Mental Status: alert; oriented to person, place and year; knowledge is normal for age; language is normal Cranial Nerves: visual fields are full to double simultaneous stimuli; extraocular movements are full and conjugate; pupils are round reactive to light; funduscopic examination shows sharp disc margins with normal vessels; symmetric facial strength; midline tongue and uvula; air conduction is greater than bone conduction bilaterally Motor: Normal strength except weakness in the left foot dorsiflexor and toe extensors, normal tone and mass; good fine motor movements; no pronator drift Sensory: intact responses to cold, vibration, proprioception and stereognosis; Except hypoesthesia in the left superficial peroneal nerve Coordination: good finger-to-nose, rapid repetitive  alternating movements and finger apposition Gait and Station: slight steppage gait: patient is able to walk on heels, left heel with difficulty, toes and tandem without difficulty; balance is adequate; Romberg exam is negative; Gower response is negative Reflexes: symmetric and diminished bilaterally; no clonus; bilateral flexor plantar responses  Assessment 1. Migraine without aura and without status migrainosus, not intractable, G43.009. 2. Migraine with aura and without status migrainosus, not intractable, G43.109. 3. Episodic tension type headache, , not intractable G44.219. 4. 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 5. 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  Discussion I do not think that there is a connection between the peroneal neuropathy and her deep vein thrombosis.  I have never seen the two together.  Nonetheless, the two together will keep her from participating in track and field until she fully recovers.  I will be interested in Dr. Hulan Fray, suggestions about treatment.  I do not think there is any further workup that is needed.  I do not know if she will need an ultrasound to demonstrate that the deep vein thromboses were gone before discontinuing medication.  I believe that she will gradually improve strength in her right  leg.  She already is able slightly get her toes up and does not have a steppage gait so this may not take as long to heal as some peroneal nerve injuries that I have treated.  I do not think that an EMG nerve conduction is going to be particularly helpful, but if need be we can perform that to confirm the diagnosis.  Plan I spent 45-minutes of face-to-face time with Jodene NamBrenna and her father, more than half of it in consultation.  She will return in two months' time for evaluation.  I told her that I would be happy to see her sooner if she made recovery before then.  She will continue to send daily prospective headache calendars at the end of each  calendar month as she has in the past.   Medication List   This list is accurate as of: 11/05/14 10:27 PM.       acetaminophen 500 MG tablet  Commonly known as:  TYLENOL  Take 1,000 mg by mouth every 6 (six) hours as needed for moderate pain.     bismuth subsalicylate 262 MG/15ML suspension  Commonly known as:  PEPTO BISMOL  Take 30 mLs by mouth every 6 (six) hours as needed for indigestion.     EPIPEN 2-PAK 0.3 mg/0.3 mL Soaj injection  Generic drug:  EPINEPHrine  Inject 0.3 mg into the muscle once.     HYDROcodone-acetaminophen 5-325 MG per tablet  Commonly known as:  NORCO/VICODIN  Take 1 tablet by mouth every 6 (six) hours as needed for moderate pain.     ondansetron 4 MG disintegrating tablet  Commonly known as:  ZOFRAN ODT  4mg  ODT q4 hours prn nausea/vomit     SUMAtriptan 20 MG/ACT nasal spray  Commonly known as:  IMITREX  ONE SPRAY INTO NOSE AT ONSET OF MIGRAINE, AND MAY REPEAT IN 2 HOURS IF HEADACHE PERSISTS     TAZORAC 0.05 % cream  Generic drug:  tazarotene  Apply 1 application topically every evening.     topiramate 25 MG tablet  Commonly known as:  TOPAMAX  Take 3 tablets (75 mg total) by mouth at bedtime.     traMADol 50 MG tablet  Commonly known as:  ULTRAM  Take 50 mg by mouth every 6 (six) hours as needed for moderate pain.     XARELTO STARTER PACK 15 & 20 MG Tbpk  Generic drug:  Rivaroxaban  Take 15-20 mg by mouth as directed. Take as directed on package: Start with one 15mg  tablet by mouth twice a day with food. On Day 22, switch to one 20mg  tablet once a day with food.      The medication list was reviewed and reconciled. All changes or newly prescribed medications were explained.  A complete medication list was provided to the patient/caregiver.  Deetta PerlaWilliam H Hickling MD

## 2014-11-07 ENCOUNTER — Telehealth: Payer: Self-pay | Admitting: Family

## 2014-11-07 NOTE — Telephone Encounter (Signed)
Dad Allison Quarryhomas Marasigan left a message asking about a letter for [employer] and [facility]. I sent [facility] to [facility] and drafted a letter for Dr Sharene SkeansHickling to sign about Carreen's participation in sports. Dad can be reached at [phone]. TG

## 2014-11-08 ENCOUNTER — Ambulatory Visit: Payer: Self-pay | Admitting: Family Medicine

## 2014-11-08 NOTE — Telephone Encounter (Signed)
I left a message for Dad that the referral was sent for PT. I also told him that a letter was ready for [name] about not participating in sports for the time being. I asked him to let me know if he needs anything else. TG

## 2014-11-09 NOTE — Telephone Encounter (Signed)
Melissa Boyd called back this morning and said that he needed the letter that Dr Sharene SkeansHickling said that he would write about Melissa Boyd's headaches, side effects of Topiramate, and accommodations needed for school. I told him that Dr Sharene SkeansHickling was out of the office today but that I would relay the message and follow up with him on Monday. Melissa Boyd agreed with this plan. TG

## 2014-11-12 ENCOUNTER — Encounter: Payer: Self-pay | Admitting: Pediatrics

## 2014-11-12 ENCOUNTER — Telehealth: Payer: Self-pay | Admitting: *Deleted

## 2014-11-12 NOTE — Telephone Encounter (Signed)
Letter has been dictated, please contact father.

## 2014-11-12 NOTE — Telephone Encounter (Signed)
I called patients father and left him a message informing him that the letter has been dictated and is at the front for him to pick up. MB

## 2014-11-12 NOTE — Telephone Encounter (Signed)
Dr[facility] Allena KatzPatel with [facility] called requesting to speak with Dr, Clelia CroftShadad about this patient.  Informed her he will return tomorrfow.  Asked that he call her before he starts clinic at [phone].  Will notify provider of this request

## 2014-11-13 ENCOUNTER — Encounter: Payer: Self-pay | Admitting: Oncology

## 2014-11-13 ENCOUNTER — Ambulatory Visit
Admit: 2014-11-13 | Disposition: A | Discharge status: Home / Self Care | Payer: Self-pay | Attending: Oncology | Admitting: Oncology

## 2014-11-13 ENCOUNTER — Other Ambulatory Visit: Payer: BLUE CROSS/BLUE SHIELD

## 2014-11-13 ENCOUNTER — Ambulatory Visit (HOSPITAL_BASED_OUTPATIENT_CLINIC_OR_DEPARTMENT_OTHER): Payer: BLUE CROSS/BLUE SHIELD | Admitting: Oncology

## 2014-11-13 ENCOUNTER — Ambulatory Visit: Payer: BLUE CROSS/BLUE SHIELD

## 2014-11-13 VITALS — BP 110/66 | HR 61 | Temp 98.2°F | Resp 18 | Ht 67.25 in | Wt 167.0 lb

## 2014-11-13 DIAGNOSIS — I82442 Acute embolism and thrombosis of left tibial vein: Secondary | ICD-10-CM

## 2014-11-13 DIAGNOSIS — I82409 Acute embolism and thrombosis of unspecified deep veins of unspecified lower extremity: Secondary | ICD-10-CM

## 2014-11-13 DIAGNOSIS — G629 Polyneuropathy, unspecified: Secondary | ICD-10-CM

## 2014-11-13 DIAGNOSIS — K645 Perianal venous thrombosis: Secondary | ICD-10-CM

## 2014-11-13 NOTE — Telephone Encounter (Signed)
Patients dad came to the office and picked up the letter. MB

## 2014-11-13 NOTE — Progress Notes (Signed)
Checked in new pt with no financial concerns at this time.  Pt states she's here for a hematology concern so financial assistance may not be needed but she has my card for any billing questions or concerns. ° °

## 2014-11-13 NOTE — Progress Notes (Signed)
Please see consult note.  

## 2014-11-13 NOTE — Consult Note (Signed)
Reason for Referral: Deep vein thrombosis.  HPI: 19 year old woman currently of [place] but attends school at [employer]. She is a rather healthy woman with history of migraine headaches but for the most part fairly healthy and very active. She is part of the track team at her [place]. She was participating in now competition in [place] in the early part of February and traveled for 10 hours and a plus right. Upon her return, she developed left leg stiffness, tightness and subsequently pain. She did have some swelling but predominantly pain was her major complaint. She was evaluated in the emergency department and had an ultrasound Doppler on 10/28/2014. The finding was consistent with a deep vein thrombosis involving the gastrocnemius, perineal and posterior tibial vein. She was started on Xarelto at that time. Her hypercoagulable workup was obtained and showed no abnormalities. She was seen in the emergency department on 10/31/2014 for chest pain and a CT angiogram ruled out a pulmonary embolism. She tolerated Xarelto very well so far and her leg pain have improved since that time. She did develop weakness in her left foot with numbness in her toes and a foot drop that was not evident previous to her pulmonary embolism. She was evaluated by neurology and was scheduled imaging studies as well as nerve conduction studies. I was asked to comment about these findings.  Clinically, she is otherwise asymptomatic. She does report migraine headaches that potentially have exacerbated recently. She does not report any other neurological deficits. She has no problems with her gait or mobility. She is having difficulty planting her foot and performing her athletic routine of the discus throw. She does not report any fevers, chills, sweats or weight loss. She did report chest pain but no shortness of breath, palpitation, orthopnea or leg edema. She does not report any cough, hemoptysis or wheezing. She does not  report any nausea, vomiting, abdominal pain, hematochezia or melena. She does not report any frequency, urgency or hesitancy. She does report pain in her quadriceps muscle. She does not report any other skeletal pain. Rest of her review of systems unremarkable.    Past Medical History  Diagnosis Date  . Headache, migraine   :  Past Surgical History  Procedure Laterality Date  . Wisdom tooth extraction    . Shoulder surgery      left  :   Current outpatient prescriptions:  .  acetaminophen (TYLENOL) 500 MG tablet, Take 1,000 mg by mouth every 6 (six) hours as needed for moderate pain., Disp: , Rfl:  .  bismuth subsalicylate (PEPTO BISMOL) 262 MG/15ML suspension, Take 30 mLs by mouth every 6 (six) hours as needed for indigestion., Disp: , Rfl:  .  EPIPEN 2-PAK 0.3 MG/0.3ML SOAJ injection, Inject 0.3 mg into the muscle once. , Disp: , Rfl:  .  ondansetron (ZOFRAN ODT) 4 MG disintegrating tablet,  ODT q4 hours prn nausea/vomit, Disp: 4 tablet, Rfl: 0 .  Rivaroxaban (XARELTO) 15 MG TABS tablet, Take by mouth., Disp: , Rfl:  .  SUMAtriptan (IMITREX) 20 MG/ACT nasal spray, ONE SPRAY INTO NOSE AT ONSET OF MIGRAINE, AND MAY REPEAT IN 2 HOURS IF HEADACHE PERSISTS, Disp: 2 Act, Rfl: 5 .  TAZORAC 0.05 % cream, Apply 1 application topically every evening., Disp: , Rfl: 4 .  traMADol (ULTRAM) 50 MG tablet, Take 50 mg by mouth every 6 (six) hours as needed for moderate pain., Disp: , Rfl:  .  XARELTO STARTER PACK 15 & 20 MG TBPK, Take 15-20 mg  by mouth as directed. Take as directed on package: Start with one 15mg  tablet by mouth twice a day with food. On Day 22, switch to one 20mg  tablet once a day with food., Disp: 51 each, Rfl: 0 .  topiramate (TOPAMAX) 25 MG tablet, Take 3 tablets (75 mg total) by mouth at bedtime., Disp: 93 tablet, Rfl: 5:  Allergies  Allergen Reactions  . Other Anaphylaxis    Any products that contain shellfish  . Shellfish Allergy Anaphylaxis  . Naproxen Nausea And  Vomiting  :  Family History  Problem Relation Age of Onset  . Anemia Father     low iron   . Hypertension Maternal Aunt   . Alzheimer's disease Paternal Grandfather   :  History   Social History  . Marital Status: Single    Spouse Name: N/A  . Number of Children: N/A  . Years of Education: N/A   Occupational History  . Not on file.   Social History Main Topics  . Smoking status: Never Smoker   . Smokeless tobacco: Never Used  . Alcohol Use: No  . Drug Use: No  . Sexual Activity: Yes    Birth Control/ Protection: Condom     Comment: lo-loestrin fe    Other Topics Concern  . Not on file   Social History Narrative  :  Pertinent items are noted in HPI.  Exam: Blood pressure 110/66, pulse 61, temperature 98.2 F (36.8 C), temperature source Oral, resp. rate 18, height 5' 7.25" (1.708 m), weight 167 lb (75.751 kg), last menstrual period 10/31/2014, SpO2 100 %. General appearance: alert and cooperative Throat: lips, mucosa, and tongue normal; teeth and gums normal Neck: no adenopathy Back: negative Resp: clear to auscultation bilaterally Chest wall: no tenderness Cardio: regular rate and rhythm, S1, S2 normal, no murmur, click, rub or gallop GI: soft, non-tender; bowel sounds normal; no masses,  no organomegaly Extremities: extremities normal, atraumatic, no cyanosis or edema Pulses: 2+ and symmetric Skin: Skin color, texture, turgor normal. No rashes or lesions Lymph nodes: Cervical, supraclavicular, and axillary nodes normal. Neurologic: Grossly normal. Slight decrease in her left plantar and dorsiflexion of the foot.      Dg Chest 2 View  10/31/2014   CLINICAL DATA:  Right-sided chest pain.  EXAM: CHEST  2 VIEW  COMPARISON:  10/27/2013  FINDINGS: The heart size and mediastinal contours are within normal limits. Both lungs are clear. The visualized skeletal structures are unremarkable.  IMPRESSION: Normal exam.   Electronically Signed   By: Francene BoyersJames  Maxwell M.D.    On: 10/31/2014 19:11   Ct Angio Chest Pe W/cm &/or Wo Cm  10/31/2014   CLINICAL DATA:  Patient diagnosed with deep venous thrombosis 10/28/2014. Central chest pain beginning today.  EXAM: CT ANGIOGRAPHY CHEST WITH CONTRAST  TECHNIQUE: Multidetector CT imaging of the chest was performed using the standard protocol during bolus administration of intravenous contrast. Multiplanar CT image reconstructions and MIPs were obtained to evaluate the vascular anatomy.  CONTRAST:  100 mL OMNIPAQUE IOHEXOL 350 MG/ML SOLN  COMPARISON:  CT chest 08/02/2014.  FINDINGS: No pulmonary embolus is identified. Heart size is normal. No axillary, hilar or mediastinal lymphadenopathy. No pleural or pericardial effusion. The lungs are clear. Incidentally imaged upper abdomen is unremarkable. No bony abnormality is identified.  Review of the MIP images confirms the above findings.  IMPRESSION: Negative for pulmonary embolus.  Negative chest CT.   Electronically Signed   By: Drusilla Kannerhomas  Dalessio M.D.   On: 10/31/2014  21:58    Assessment and Plan:   19 year old woman with the following issues:  1. 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 The blood clot was provoked by a long stride over 10 hours to participate in athletic event as well as oral contraception's. She is currently on Xarelto and doing well. Her father also had deep vein thrombosis following a orthopedic procedure. Otherwise no family history of thrombophilia. The natural course of this event was discussed with the patient and her father. At this time, I agree with the current evaluation and management and have recommended Xarelto for at least 3 months. I did not recommend repeating an ultrasound at this time as it will not change our management. I anticipate continuous improvement in her symptoms moving forward. She has already noted  significant improvement in her pain and stiffness.  2. Perineal neuropathy of unclear etiology. I do not see any connection related to her deep vein thrombosis but could certainly have been exacerbated or revealed by her recent event. She is followed up with neurology regarding this issue. I have recommended continued physical therapy and rehabilitation of that foot.  3. Contraception: I have recommended to avoid hormone-based contraception especially with estrogen-based preparations. I have recommended mechanical contraception or progesterone based contraception.  4. Participation in athletic training: I have recommended continuing a course of Xarelto for 3 months before resuming participation in her athletic competition. That will allow for her to complete anticoagulation course and fully healed any muscular, nerve issues that could have been precipitated by the deep vein thrombosis.

## 2014-11-19 ENCOUNTER — Other Ambulatory Visit: Payer: Self-pay | Admitting: Neurology

## 2014-11-19 DIAGNOSIS — R200 Anesthesia of skin: Secondary | ICD-10-CM

## 2014-11-19 DIAGNOSIS — M5489 Other dorsalgia: Secondary | ICD-10-CM

## 2014-11-19 DIAGNOSIS — M79605 Pain in left leg: Secondary | ICD-10-CM

## 2014-11-20 ENCOUNTER — Telehealth: Payer: Self-pay | Admitting: *Deleted

## 2014-11-20 DIAGNOSIS — G5732 Lesion of lateral popliteal nerve, left lower limb: Secondary | ICD-10-CM

## 2014-11-20 NOTE — Telephone Encounter (Signed)
Melissa Boyd left messages on my voicemail at [phone] with the same request. TG

## 2014-11-20 NOTE — Telephone Encounter (Signed)
Melissa Boyd, father, called to state they are ready to have EMG/NCV studies that Dr. Sharene SkeansHickling spoke about at the last visit.  They would like to have them done prior to the 18th of this month.   Melissa Boyd can be reached at [phone].  Melissa Boyd's number is [phone]

## 2014-11-20 NOTE — Telephone Encounter (Signed)
I ordered the study, please schedule it with [facility] with Dr. Anne HahnWillis and call the family back.

## 2014-11-21 NOTE — Telephone Encounter (Signed)
I called and [facility] with [facility]. She will call the family and schedule the study. TG

## 2014-12-04 ENCOUNTER — Encounter: Payer: BLUE CROSS/BLUE SHIELD | Admitting: Neurology

## 2014-12-04 ENCOUNTER — Encounter: Payer: BLUE CROSS/BLUE SHIELD | Admitting: Radiology

## 2014-12-06 ENCOUNTER — Encounter: Payer: Self-pay | Admitting: Neurology

## 2014-12-06 ENCOUNTER — Ambulatory Visit (INDEPENDENT_AMBULATORY_CARE_PROVIDER_SITE_OTHER): Payer: BLUE CROSS/BLUE SHIELD | Admitting: Neurology

## 2014-12-06 ENCOUNTER — Ambulatory Visit (INDEPENDENT_AMBULATORY_CARE_PROVIDER_SITE_OTHER): Payer: Self-pay | Admitting: Neurology

## 2014-12-06 DIAGNOSIS — G5732 Lesion of lateral popliteal nerve, left lower limb: Secondary | ICD-10-CM

## 2014-12-06 DIAGNOSIS — M21372 Foot drop, left foot: Secondary | ICD-10-CM | POA: Diagnosis not present

## 2014-12-06 DIAGNOSIS — G5702 Lesion of sciatic nerve, left lower limb: Secondary | ICD-10-CM

## 2014-12-06 NOTE — Progress Notes (Signed)
Please refer to EMG and nerve conduction study procedure note. 

## 2014-12-06 NOTE — Procedures (Signed)
     HISTORY:  Melissa SaferBreanna Boyd is a 19 year old patient with onset of a deep venous thrombosis affecting the left leg in February 2016. The patient has noted some issues with a foot drop on the left around that time. She also reports a chronic issue with low back pain without radiation down the left leg. She is being evaluated for possible neuropathy or a lumbosacral radiculopathy.  NERVE CONDUCTION STUDIES:  Nerve conduction studies were performed on the left lower extremity. The distal motor latencies and motor amplitudes for the peroneal and posterior tibial nerves were within normal limits. The nerve conduction velocities for these nerves were also normal. The H reflex latencies were normal. The sensory latencies for the peroneal nerve was within normal limits.   EMG STUDIES:  EMG study was performed on the left lower extremity:  The tibialis anterior muscle reveals 1 to 3K motor units with decreased recruitment. No fibrillations or positive waves were seen. The peroneus tertius muscle reveals 1 to 3K motor units with decreased recruitment. No fibrillations or positive waves were seen. The medial gastrocnemius muscle reveals 1 to 3K motor units with decreased recruitment. No fibrillations or positive waves were seen. The vastus lateralis muscle reveals 2 to 3K motor units with full recruitment. No fibrillations or positive waves were seen. The iliopsoas muscle reveals 2 to 4K motor units with full recruitment. No fibrillations or positive waves were seen. The biceps femoris muscle (long head) reveals 2 to 3K motor units with full recruitment. No fibrillations or positive waves were seen. The lumbosacral paraspinal muscles were tested at 3 levels, and revealed no abnormalities of insertional activity at all 3 levels tested. There was good relaxation.   IMPRESSION:  Nerve conduction studies done on the left lower extremity were unremarkable, without evidence of a neuropathy. EMG  evaluation of the left lower extremity shows no significant abnormalities. No evidence of a lumbosacral radiculopathy is seen, there is no evidence of a peroneal neuropathy on the left. The patient was unable to activate the peroneal muscles well, either related to poor motor effort or due to a central etiology of the left foot drop. Clinical correlation is required.  Marlan Palau. Keith [name] MD 12/06/2014 1:50 PM  [facility] [address] [place], [place] [id number]  Phone [phone] Fax [phone]

## 2014-12-07 ENCOUNTER — Ambulatory Visit
Admission: RE | Admit: 2014-12-07 | Discharge: 2014-12-07 | Disposition: A | Discharge status: Home / Self Care | Payer: BLUE CROSS/BLUE SHIELD | Source: Ambulatory Visit | Attending: Neurology | Admitting: Neurology

## 2014-12-07 DIAGNOSIS — R200 Anesthesia of skin: Secondary | ICD-10-CM

## 2014-12-07 DIAGNOSIS — M5489 Other dorsalgia: Secondary | ICD-10-CM

## 2014-12-07 DIAGNOSIS — M79605 Pain in left leg: Secondary | ICD-10-CM

## 2014-12-07 MED ORDER — GADOBENATE DIMEGLUMINE 529 MG/ML IV SOLN
16.0000 mL | Freq: Once | INTRAVENOUS | Status: AC | PRN
  Administered 2014-12-07: 16 mL via INTRAVENOUS

## 2014-12-10 ENCOUNTER — Telehealth: Payer: Self-pay | Admitting: Pediatrics

## 2014-12-10 DIAGNOSIS — M21372 Foot drop, left foot: Secondary | ICD-10-CM

## 2014-12-10 NOTE — Telephone Encounter (Signed)
I spoke Melissa Boyd.  We need to get an MRI scan of her brain and cervical spine without and with contrast to look for demyelinating disease or some other process.  She continues to have a stable foot drop.  EMGs and nerve conductions were normal.

## 2014-12-11 NOTE — Telephone Encounter (Signed)
I obtained the prior auths for both studies and called [facility] to schedule. They will contact the patient and schedule the appointment. TG

## 2014-12-11 NOTE — Telephone Encounter (Signed)
Melissa Boyd left a message saying that [facility] had [facility] her and scheduled her MRI studies for April 17th. She asked if that date is ok with Dr Sharene SkeansHickling or if MRI needs to be done sooner? I called [facility] and April 17th is the first date available for 2 back to back studies. They said that Melissa Boyd could call frequently to see if cancellations had occurred. Melissa Boyd can be reached at [phone]. TG

## 2014-12-11 NOTE — Telephone Encounter (Signed)
[facility] TG

## 2014-12-11 NOTE — Telephone Encounter (Signed)
Melissa Boyd I'm not sure if you seen this I'm forwarding to you, please let me know if you need me to assist you in any way with this. MB

## 2014-12-11 NOTE — Telephone Encounter (Addendum)
Her symptoms are stable.  I think that April 17 is fine unless it happens to coincide with one her finals.  If they are pushing to do this sooner, we will have to do new PAs, if we can find something.  Please call her.

## 2014-12-14 ENCOUNTER — Ambulatory Visit
Admit: 2014-12-14 | Disposition: A | Discharge status: Home / Self Care | Payer: Self-pay | Attending: Oncology | Admitting: Oncology

## 2014-12-21 ENCOUNTER — Ambulatory Visit: Admit: 2014-12-21 | Disposition: A | Discharge status: Home / Self Care | Payer: Self-pay | Admitting: Family Medicine

## 2014-12-23 ENCOUNTER — Encounter (HOSPITAL_COMMUNITY): Payer: Self-pay | Admitting: *Deleted

## 2014-12-23 ENCOUNTER — Emergency Department (HOSPITAL_COMMUNITY): Payer: BLUE CROSS/BLUE SHIELD

## 2014-12-23 ENCOUNTER — Emergency Department (HOSPITAL_COMMUNITY)
Admission: EM | Admit: 2014-12-23 | Discharge: 2014-12-23 | Disposition: A | Discharge status: Home / Self Care | Payer: BLUE CROSS/BLUE SHIELD | Attending: Emergency Medicine | Admitting: Emergency Medicine

## 2014-12-23 DIAGNOSIS — G43909 Migraine, unspecified, not intractable, without status migrainosus: Secondary | ICD-10-CM | POA: Insufficient documentation

## 2014-12-23 DIAGNOSIS — R0600 Dyspnea, unspecified: Secondary | ICD-10-CM | POA: Insufficient documentation

## 2014-12-23 DIAGNOSIS — I82402 Acute embolism and thrombosis of unspecified deep veins of left lower extremity: Secondary | ICD-10-CM | POA: Diagnosis not present

## 2014-12-23 DIAGNOSIS — R0602 Shortness of breath: Secondary | ICD-10-CM | POA: Diagnosis present

## 2014-12-23 DIAGNOSIS — R0789 Other chest pain: Secondary | ICD-10-CM

## 2014-12-23 DIAGNOSIS — Z79899 Other long term (current) drug therapy: Secondary | ICD-10-CM | POA: Diagnosis not present

## 2014-12-23 LAB — CBC
HCT: 38.1 % (ref 36.0–46.0)
Hemoglobin: 13.2 g/dL (ref 12.0–15.0)
MCH: 34.2 pg — ABNORMAL HIGH (ref 26.0–34.0)
MCHC: 34.6 g/dL (ref 30.0–36.0)
MCV: 98.7 fL (ref 78.0–100.0)
PLATELETS: 186 10*3/uL (ref 150–400)
RBC: 3.86 MIL/uL — AB (ref 3.87–5.11)
RDW: 12 % (ref 11.5–15.5)
WBC: 4.3 10*3/uL (ref 4.0–10.5)

## 2014-12-23 LAB — BASIC METABOLIC PANEL
ANION GAP: 8 (ref 5–15)
BUN: 14 mg/dL (ref 6–23)
CALCIUM: 8.7 mg/dL (ref 8.4–10.5)
CHLORIDE: 108 mmol/L (ref 96–112)
CO2: 24 mmol/L (ref 19–32)
Creatinine, Ser: 1 mg/dL (ref 0.50–1.10)
GFR calc Af Amer: >90 mL/min (ref >90)
GFR, EST NON AFRICAN AMERICAN: 80 mL/min — AB (ref >90)
GLUCOSE: 64 mg/dL — AB (ref 70–99)
Potassium: 3.5 mmol/L (ref 3.5–5.1)
SODIUM: 140 mmol/L (ref 135–145)

## 2014-12-23 LAB — I-STAT TROPONIN, ED: TROPONIN I, POC: 0 ng/mL (ref 0.00–0.08)

## 2014-12-23 MED ORDER — IOHEXOL 350 MG/ML SOLN
100.0000 mL | Freq: Once | INTRAVENOUS | Status: AC | PRN
  Administered 2014-12-23: 100 mL via INTRAVENOUS

## 2014-12-23 NOTE — Discharge Instructions (Signed)

## 2014-12-23 NOTE — Progress Notes (Addendum)
Left lower extremity venous duplex completed.  Left:  No evidence of DVT, superficial thrombosis, or Baker's cyst.  The DVT found in the left gastrocnemius, posterior tibial, and peroneal veins on 10-28-14 appears resolved.  Right:  Negative for DVT in the common femoral vein.

## 2014-12-23 NOTE — ED Provider Notes (Signed)
CSN: [id number]     Arrival date & time 12/23/14  1334 History   First MD Initiated Contact with Patient 12/23/14 1505     Chief Complaint  Patient presents with  . Shortness of Breath  . chest pressure, hx DVT      (Consider location/radiation/quality/duration/timing/severity/associated sxs/prior Treatment) HPI patient has a known DVT on Xarelto. She developed shortness of breath and chest pain this morning. She endorses some lightheadedness but no syncope. She's had no recent cough or fever. She denies increased pain or swelling in her leg. No other associated symptoms. The patient reports that her DVT was secondary to immobility on a long episode of travel. She describes having had complete workup and no identified underlying etiology. Past Medical History  Diagnosis Date  . Headache, migraine    Past Surgical History  Procedure Laterality Date  . Wisdom tooth extraction    . Shoulder surgery      left   Family History  Problem Relation Age of Onset  . Anemia Father     low iron   . Hypertension Maternal Aunt   . Alzheimer's disease Paternal Grandfather    History  Substance Use Topics  . Smoking status: Never Smoker   . Smokeless tobacco: Never Used  . Alcohol Use: No   OB History    Gravida Para Term Preterm AB TAB SAB Ectopic Multiple Living   0              Review of Systems 10 Systems reviewed and are negative for acute change except as noted in the HPI.    Allergies  Other; Shellfish allergy; and Naproxen  Home Medications   Prior to Admission medications   Medication Sig Start Date End Date Taking? Authorizing Provider  acetaminophen (TYLENOL) 500 MG tablet Take 1,000 mg by mouth every 6 (six) hours as needed for moderate pain.   Yes Historical Provider, MD  SUMAtriptan (IMITREX) 20 MG/ACT nasal spray ONE SPRAY INTO NOSE AT ONSET OF MIGRAINE, AND MAY REPEAT IN 2 HOURS IF HEADACHE PERSISTS 09/10/14  Yes Deetta PerlaWilliam H Hickling, MD  topiramate (TOPAMAX) 25 MG  tablet Take 3 tablets (75 mg total) by mouth at bedtime. 11/05/14  Yes Deetta PerlaWilliam H Hickling, MD  XARELTO 20 MG TABS tablet Take 20 mg by mouth daily.  12/11/14  Yes Historical Provider, MD  EPIPEN 2-PAK 0.3 MG/0.3ML SOAJ injection Inject 0.3 mg into the muscle once.  09/29/13   Historical Provider, MD  ondansetron (ZOFRAN ODT) 4 MG disintegrating tablet 4mg  ODT q4 hours prn nausea/vomit Patient not taking: Reported on 12/23/2014 10/27/13   Robyn M Hess, PA-C  XARELTO STARTER PACK 15 & 20 MG TBPK Take 15-20 mg by mouth as directed. Take as directed on package: Start with one 15mg  tablet by mouth twice a day with food. On Day 22, switch to one 20mg  tablet once a day with food. Patient not taking: Reported on 12/23/2014 10/28/14   Teressa LowerVrinda Pickering, NP   BP 106/55 mmHg  Pulse 60  Temp(Src) 98.4 F (36.9 C) (Oral)  Resp 18  SpO2 100%  LMP 12/21/2014 (Exact Date) Physical Exam  Constitutional: She is oriented to person, place, and time. She appears well-developed and well-nourished.  HENT:  Head: Normocephalic and atraumatic.  Eyes: EOM are normal. Pupils are equal, round, and reactive to light.  Neck: Neck supple.  Cardiovascular: Normal rate, regular rhythm, normal heart sounds and intact distal pulses.   Pulmonary/Chest: Effort normal and breath sounds normal.  Abdominal:  Soft. Bowel sounds are normal. She exhibits no distension. There is no tenderness.  Musculoskeletal: Normal range of motion. She exhibits no edema.  Neurological: She is alert and oriented to person, place, and time. She has normal strength. Coordination normal. GCS eye subscore is 4. GCS verbal subscore is 5. GCS motor subscore is 6.  Skin: Skin is warm, dry and intact.  Psychiatric: She has a normal mood and affect.    ED Course  Procedures (including critical care time) Labs Review Labs Reviewed  CBC - Abnormal; Notable for the following:    RBC 3.86 (*)    MCH 34.2 (*)    All other components within normal limits   BASIC METABOLIC PANEL - Abnormal; Notable for the following:    Glucose, Bld 64 (*)    GFR calc non Af Amer 80 (*)    All other components within normal limits  I-STAT TROPOININ, ED    Imaging Review Ct Angio Chest Pe W/cm &/or Wo Cm  12/23/2014   CLINICAL DATA:  History of blood clots in legs. Currently on Xarelto. Patient reports pressure and heaviness in shortness of breath for 1 hr.  EXAM: CT ANGIOGRAPHY CHEST WITH CONTRAST  TECHNIQUE: Multidetector CT imaging of the chest was performed using the standard protocol during bolus administration of intravenous contrast. Multiplanar CT image reconstructions and MIPs were obtained to evaluate the vascular anatomy.  CONTRAST:  OMNIPAQUE IOHEXOL 350 MG/ML SOLN  COMPARISON:  10/31/2014  FINDINGS: Mediastinum: The heart size appears normal. There is no pericardial effusion. The trachea is patent and appears midline. Normal appearance of the esophagus. No mediastinal or hilar adenopathy identified. There is no axillary or supraclavicular adenopathy.  No abnormal filling defects within the main pulmonary artery or its branches identified to suggest a clinically significant acute pulmonary embolus.  Lungs/Pleura:  No pleural effusion.  No air space consolidation.  Upper Abdomen: The visualized portions of the liver and spleen appear within normal limits.  Musculoskeletal: Review of the visualized bony structures is negative for aggressive lytic or sclerotic bone lesion.  Review of the MIP images confirms the above findings.  IMPRESSION: 1. No acute cardiopulmonary abnormalities. 2. No evidence for pulmonary embolus.   Electronically Signed   By: Signa Kell M.D.   On: 12/23/2014 16:33     EKG Interpretation   Date/Time:  Sunday December 23 2014 13:38:29 EDT Ventricular Rate:  66 PR Interval:  148 QRS Duration: 75 QT Interval:  388 QTC Calculation: 406 R Axis:   36 Text Interpretation:  Sinus rhythm Low voltage, precordial leads Baseline  wander  in lead(s) II III aVF no ischemia, no change from prior. Confirmed  by Donnald Garre, MD, Lebron Conners [phone]) on 12/23/2014 4:55:31 PM      MDM   Final diagnoses:  Dyspnea  DVT (deep venous thrombosis), left  Other chest pain   The patient presents with chest pain and shortness of breath. She has prior diagnosed DVT. CT PE study was pursued due to known DVT. This was negative for any identification of PE or other complicating factors at this time. Repeat lower extremity ultrasound did not show any residual DVT at this time. The patient is instructed to follow-up with [facility] for ongoing management of prior DVT and evaluation for continuation of Xarelto.    Arby Barrette, MD 12/23/14 [phone]

## 2014-12-23 NOTE — ED Notes (Signed)
Pt reports she is being treated for 3 blood clots in left leg, currently on xerelta. Reports chest pressure/heaviness and SOB x1 hour. Denies pain. Able to speak in full sentences.

## 2014-12-25 ENCOUNTER — Telehealth: Payer: Self-pay | Admitting: Pediatrics

## 2014-12-25 NOTE — Telephone Encounter (Signed)
Headache calendar from March 2016 on [name]. 31 days were recorded.  13 days were headache free.  17 days were associated with tension type headaches, 3 required treatment.  There was 1 day of migraines, none were severe.  There is no reason to change current treatment.  Please contact the family.

## 2014-12-27 NOTE — Telephone Encounter (Signed)
I spoke with [name] the patients mom informing her that Dr. Sharene SkeansHickling has reviewed Makyla's March diary and there's no need to make any changes and a reminder to send in April when complete, mom agreed. MB

## 2014-12-30 ENCOUNTER — Ambulatory Visit
Admission: RE | Admit: 2014-12-30 | Discharge: 2014-12-30 | Disposition: A | Discharge status: Home / Self Care | Payer: BLUE CROSS/BLUE SHIELD | Source: Ambulatory Visit | Attending: Pediatrics | Admitting: Pediatrics

## 2014-12-30 DIAGNOSIS — M21372 Foot drop, left foot: Secondary | ICD-10-CM

## 2014-12-30 MED ORDER — GADOBENATE DIMEGLUMINE 529 MG/ML IV SOLN
15.0000 mL | Freq: Once | INTRAVENOUS | Status: AC | PRN
  Administered 2014-12-30: 15 mL via INTRAVENOUS

## 2014-12-31 ENCOUNTER — Telehealth: Payer: Self-pay | Admitting: Pediatrics

## 2014-12-31 NOTE — Telephone Encounter (Signed)
MRI scan of the brain and cervical spine are entirely normal.  I am unable to explain the left foot drop.  I left a message for the patient to call so that I can discuss this with her.

## 2014-12-31 NOTE — Telephone Encounter (Signed)
Janeya returned Dr Hickling's call regarding the MRI. She can be reached at [phone]. TG

## 2015-01-01 NOTE — Telephone Encounter (Signed)
6 minute phone call.  Melissa Boyd is getting stronger.  I told her about the MRI scan.  I'm not certain that we can explain her left foot weakness but she is getting better.  In my opinion she needs to see hematologist to make a decision whether or not to come off the medication.  It seems very reasonable given that she no longer has a clot based on ultrasound.  She was post to be on medication for 3 months.  I want to make certain that there is an evaluation before she comes off it.  I would not clear her to go back to active competition until she is off Xarelto and her foot is normally strong.

## 2015-01-11 ENCOUNTER — Ambulatory Visit
Admit: 2015-01-11 | Disposition: A | Discharge status: Home / Self Care | Payer: Self-pay | Attending: Family Medicine | Admitting: Family Medicine

## 2015-01-16 ENCOUNTER — Telehealth: Payer: Self-pay | Admitting: Family

## 2015-01-16 NOTE — Telephone Encounter (Signed)
I want her evaluated before we write a note.  If you would please schedule her and then after you assess her have me come in, I would feel much better.  I don't understand her left foot drop.  We were unable to find an abnormality to explain it.  I want to make certain that it is gone.

## 2015-01-16 NOTE — Telephone Encounter (Signed)
Melissa Boyd left a message saying that she wants to return to practices. She asked if Dr Sharene SkeansHickling would give her a note to allow her to return or does she need to make an appt for reevaluation first. Please call Melissa Boyd at [phone] to discuss. TG

## 2015-01-17 NOTE — Telephone Encounter (Signed)
Melissa Boyd called back and scheduled appointment with me on Tuesday morning Jan 22, 2015. TG

## 2015-01-17 NOTE — Telephone Encounter (Signed)
I left a message for Melissa Boyd to call me back so that I can set up the appointment. TG

## 2015-01-22 ENCOUNTER — Ambulatory Visit (INDEPENDENT_AMBULATORY_CARE_PROVIDER_SITE_OTHER): Payer: BLUE CROSS/BLUE SHIELD | Admitting: Family

## 2015-01-22 ENCOUNTER — Encounter: Payer: Self-pay | Admitting: Family

## 2015-01-22 VITALS — BP 114/66 | HR 68 | Ht 67.5 in | Wt 166.4 lb

## 2015-01-22 DIAGNOSIS — G43009 Migraine without aura, not intractable, without status migrainosus: Secondary | ICD-10-CM | POA: Diagnosis not present

## 2015-01-22 DIAGNOSIS — G44219 Episodic tension-type headache, not intractable: Secondary | ICD-10-CM

## 2015-01-22 DIAGNOSIS — G5732 Lesion of lateral popliteal nerve, left lower limb: Secondary | ICD-10-CM | POA: Diagnosis not present

## 2015-01-22 DIAGNOSIS — G5702 Lesion of sciatic nerve, left lower limb: Secondary | ICD-10-CM

## 2015-01-22 DIAGNOSIS — G43109 Migraine with aura, not intractable, without status migrainosus: Secondary | ICD-10-CM

## 2015-01-22 DIAGNOSIS — Z86718 Personal history of other venous thrombosis and embolism: Secondary | ICD-10-CM

## 2015-01-22 NOTE — Progress Notes (Signed)
Patient: Melissa Boyd MRN: [id number] Sex: female DOB: 1996-01-18  Provider: Elveria RisingGOODPASTURE, Hakiem Malizia, NP Location of Care:  Child [facility]  Note type: Routine return visit  History of Present Illness: Referral Source: Dr. Verne CarrowWilliam Young [facility] Chief Complaint: Food Drop and Migraines  Melissa Boyd is a 19 y.o. 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 Melissa Boyd was last seen by Dr Sharene SkeansHickling on November 05, 2014. At that time she was recovering from a deep vein thrombosis in her left leg. She also had new finding of left foot drop.  Melissa Boyd had a nerve conduction and EMG study that was normal, and an MRI of the brain that was normal. She also underwent a course of physical therapy for her leg.   Today Melissa Boyd reports that she no longer has any problem with her leg and wants a release to return to sports. She says that she has a follow up appointment with Dr Tanja PortMuhammed, her hematologist in the near future for a release from him as well. 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   Melissa Boyd also reports that her migraines have improved. She had 9 tension headaches in April, 2 of which required treatment. She has had 3 tension headaches, one of which required treatment and 1 migraine thus far in May. She is taking and tolerating topiramate for the migraines. She says that she believes that she had the migraine earlier this week from stress, as she is in her last week of school and having final examinations.   Melissa Boyd has no other health questions or concerns today.  Review of Systems: Please see the HPI for neurologic and other pertinent review of systems. Otherwise, the following systems are noncontributory including constitutional, eyes, ears, nose and throat, cardiovascular, respiratory,  gastrointestinal, genitourinary, musculoskeletal, skin, endocrine, hematologic/lymph, allergic/immunologic and psychiatric.   Past Medical History  Diagnosis Date  . Headache, migraine    Hospitalizations: No., Head Injury: No., Nervous System Infections: No., Immunizations up to date: Yes.   Past Medical History Comments: She had sports injury of strained hamstring and hip flexor, as well as strained back muscles 2014 while exercising.   Surgical History Past Surgical History  Procedure Laterality Date  . Wisdom tooth extraction    . Shoulder surgery      left    Family History family history includes Alzheimer's disease in her paternal grandfather; Anemia in her father; Hypertension in her maternal aunt. Family History is otherwise negative for migraines, seizures, cognitive impairment, blindness, deafness, birth defects, chromosomal disorder, autism.  Social History History   Social History  . Marital Status: Single    Spouse Name: N/A  . Number of Children: N/A  . Years of Education: N/A   Social History Main Topics  . Smoking status: Never Smoker   . Smokeless tobacco: Never Used  . Alcohol Use: No  . Drug Use: No  . Sexual Activity: Yes    Birth Control/ Protection: Condom     Comment: lo-loestrin fe    Other Topics Concern  . Not on file   Social History Narrative   Educational level: junior college School Attending: [employer] Living with:  self  Hobbies/Interest: Track School comments:  Melissa Boyd is going well in school.  Allergies Allergies  Allergen Reactions  . Other Anaphylaxis    Any products that contain  shellfish  . Shellfish Allergy Anaphylaxis  . Naproxen Nausea And Vomiting    Physical Exam BP 114/66 mmHg  Pulse 68  Ht 5' 7.5" (1.715 m)  Wt 166 lb 6.4 oz (75.479 kg)  BMI 25.66 kg/m2 General: alert, well developed, well nourished, in no acute distress, black hair, brown eyes, right handed Head: normocephalic, no dysmorphic features Ears,  Nose and Throat: Otoscopic: tympanic membranes normal; pharynx: oropharynx is pink without exudates or tonsillar hypertrophy Neck: supple, full range of motion, no cranial or cervical bruits Respiratory: auscultation clear Cardiovascular: no murmurs, pulses are normal Musculoskeletal: no skeletal deformities or apparent scoliosis Skin: no rashes or neurocutaneous lesions  Neurologic Exam  Mental Status: alert; oriented to person, place and year; knowledge is normal for age; language is normal Cranial Nerves: visual fields are full to double simultaneous stimuli; extraocular movements are full and conjugate; pupils are round reactive to light; funduscopic examination shows sharp disc margins with normal vessels; symmetric facial strength; midline tongue and uvula; air conduction is greater than bone conduction bilaterally Motor: Normal strength except for barely perceptible difference in strength in her toe extensors, normal tone and mass; good fine motor movements; no pronator drift Sensory: intact responses to cold, vibration, proprioception and stereognosis; Except hypoesthesia in the left superficial peroneal nerve Coordination: good finger-to-nose, rapid repetitive alternating movements and finger apposition Gait and Station: Gait is normal: patient is able to walk on heels, toes and tandem without difficulty; balance is adequate; Romberg exam is negative; Gower response is negative Reflexes: symmetric and diminished bilaterally; no clonus; bilateral flexor plantar responses  Impression 1. Migraine without aura and without status migrainosus, not intractable 2. Episodic tension type headache, not intractable 3. History of deep vein thrombosis left lower extremity 4. History of left common peroneal neuropathy  Recommendations for plan of care [facility]. Since her last visit, Melissa Boyd has MRI of the brain and NCV/EMG studies which were reviewed with  her today. Melissa Boyd is a 19 year old young woman with history of migraine without aura, episodic tension headaches, history of deep vein thrombosis in February 2016, and history of left common peroneal neuropathy. She no longer has foot drop from the neuropathy and may return to her usual sports activities. She plans to work with a trainer to regain her previous activity level as she is a Optometristcompetitive athlete.I gave her a letter to provide to her school.   The medication list was reviewed and reconciled.  No changes were made in the prescribed medications today.  A complete medication list was provided to the patient.  Dr. Sharene SkeansHickling was consulted and came in to see the patient. He agreed with plan for her to work with a trainer to gradually return to her previous level of exercise.   Total time spent with the patient was 30 minutes, of which 50% or more was spent in counseling and coordination of care.

## 2015-01-22 NOTE — Patient Instructions (Signed)
I have given you a letter to give to your school to allow you to return to sports and exercise. If you develop any weakness in your leg or foot again, contact this office.   Continue to keep headache diaries and continue taking Topiramate as ordered. If you have increase in headache frequency or severity, let me know.  Please plan to return for follow up in 4 months or sooner if needed.

## 2015-01-29 ENCOUNTER — Ambulatory Visit (HOSPITAL_BASED_OUTPATIENT_CLINIC_OR_DEPARTMENT_OTHER): Payer: BLUE CROSS/BLUE SHIELD | Admitting: Oncology

## 2015-01-29 VITALS — BP 116/64 | HR 62 | Temp 99.1°F | Resp 18 | Ht 67.5 in | Wt 170.0 lb

## 2015-01-29 DIAGNOSIS — Z86718 Personal history of other venous thrombosis and embolism: Secondary | ICD-10-CM

## 2015-01-29 NOTE — Progress Notes (Signed)
Hematology and Oncology Follow Up Visit  Melissa Boyd [id number] 11/20/1995 19 y.o. 01/29/2015 4:39 PM   Principle Diagnosis: 19 year old woman with the beta vein thrombosis diagnosed in February 2016. This was provoked by a long car ride while on oral contraceptives.   Prior Therapy: Xarelto between February 2016 in May 2016.  Current therapy: Observation and surveillance.  Interim History: Ms. Melissa Boyd presents today for a follow-up visit. Since her last visit, she has been doing very well. She completed in 3 months of Xarelto without any complications. She did report increased menstrual bleeding but have subsided after stopping Xarelto. She also reported a foot drop is improved and actually feels perfectly normal at this time. She is able to walk without any difficulties. But have not resumed any training. Repeat ultrasound in April 2016 showed resolution of her blood clot.   Clinically, she is otherwise asymptomatic. She does report migraine headaches which have been relatively infrequent. She does not report any fevers, chills, sweats or weight loss. She did report chest pain but no shortness of breath, palpitation, orthopnea or leg edema. She does not report any cough, hemoptysis or wheezing. She does not report any nausea, vomiting, abdominal pain, hematochezia or melena. She does not report any frequency, urgency or hesitancy. She does report pain in her quadriceps muscle. She does not report any other skeletal pain. Rest of her review of systems unremarkable.  Medications: I have reviewed the patient's current medications.  Current Outpatient Prescriptions  Medication Sig Dispense Refill  . acetaminophen (TYLENOL) 500 MG tablet Take 1,000 mg by mouth every 6 (six) hours as needed for moderate pain.    Melissa Boyd. EPIPEN 2-PAK 0.3 MG/0.3ML SOAJ injection Inject 0.3 mg into the muscle once.     . SUMAtriptan (IMITREX) 20 MG/ACT nasal spray ONE SPRAY INTO NOSE AT ONSET OF MIGRAINE, AND MAY REPEAT  IN 2 HOURS IF HEADACHE PERSISTS 2 Act 5  . topiramate (TOPAMAX) 25 MG tablet Take 3 tablets (75 mg total) by mouth at bedtime. 93 tablet 5  . ondansetron (ZOFRAN ODT) 4 MG disintegrating tablet 4mg  ODT q4 hours prn nausea/vomit (Patient not taking: Reported on 01/29/2015) 4 tablet 0   No current facility-administered medications for this visit.     Allergies:  Allergies  Allergen Reactions  . Other Anaphylaxis    Any products that contain shellfish  . Shellfish Allergy Anaphylaxis  . Naproxen Nausea And Vomiting    Past Medical History, Surgical history, Social history, and Family History were reviewed and updated.   Physical Exam: Blood pressure 116/64, pulse 62, temperature 99.1 F (37.3 C), temperature source Oral, resp. rate 18, height 5' 7.5" (1.715 m), weight 170 lb (77.111 kg), SpO2 99 %. ECOG: 0 General appearance: alert and cooperative Head: Normocephalic, without obvious abnormality Neck: no adenopathy Lymph nodes: Cervical, supraclavicular, and axillary nodes normal. Heart:regular rate and rhythm, S1, S2 normal, no murmur, click, rub or gallop Lung:chest clear, no wheezing, rales, normal symmetric air entry Abdomin: soft, non-tender, without masses or organomegaly EXT:no erythema, induration, or nodules   Lab Results: Lab Results  Component Value Date   WBC 4.3 12/23/2014   HGB 13.2 12/23/2014   HCT 38.1 12/23/2014   MCV 98.7 12/23/2014   PLT 186 12/23/2014     Chemistry      Component Value Date/Time   NA 140 12/23/2014 1433   NA 140 08/02/2014 1842   K 3.5 12/23/2014 1433   K 3.8 08/02/2014 1842   CL 108 12/23/2014 1433  CL 109* 08/02/2014 1842   CO2 24 12/23/2014 1433   CO2 24 08/02/2014 1842   BUN 14 12/23/2014 1433   BUN 15 08/02/2014 1842   CREATININE 1.00 12/23/2014 1433   CREATININE 1.05 08/02/2014 1842      Component Value Date/Time   CALCIUM 8.7 12/23/2014 1433   CALCIUM 8.0* 08/02/2014 1842   ALKPHOS 46 10/27/2013 0110   AST 18  10/27/2013 0110   ALT 12 10/27/2013 0110   BILITOT 0.3 10/27/2013 0110         Impression and Plan:  19 year old woman with the following issues:  1. 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 The blood clot was provoked by a long stride over 10 hours to participate in athletic event as well as oral contraception. She completed 3 months of anticoagulation and follow-up Doppler showed resolution of her blood clot.  I see no reason to resume full dose anticoagulation given the fact that as her first episode and was clearly provoked. I have recommended avoiding estrogen-based oral contraceptives as well as immobilization. I counseled her about situation that increase her blood clots which include long flights, rides, orthopedic surgery and high estrogen state. She is to try to avoid the situation is as much as possible.  2. Perineal neuropathy of unclear etiology. This have resolved at this time new  3. Contraception: I have continued to recommend not on estrogen-based contraception.  I will be happy to see her in the future as needed.  [facility],Satish Hammers, MD 5/17/20164:39 PM

## 2015-03-05 ENCOUNTER — Other Ambulatory Visit: Payer: Self-pay | Admitting: Family

## 2015-04-01 ENCOUNTER — Telehealth: Payer: Self-pay | Admitting: Pediatrics

## 2015-04-01 NOTE — Telephone Encounter (Signed)
I spoke with [name] the patients mom informing her that Dr. Sharene SkeansHickling [name] and there's no need to make any changes and a reminder for patient to send in July when complete, mom agreed. MB

## 2015-04-01 NOTE — Telephone Encounter (Signed)
Headache calendar from [place] 2016 [place]. 30 days were recorded.  13 days were headache free.  16 days were associated with tension type headaches, 6 required treatment.  There was 1 day of migraines, 1 was severe.  There is no reason to change current treatment.  Please contact the patient.

## 2015-05-09 ENCOUNTER — Encounter: Payer: Self-pay | Admitting: *Deleted

## 2015-05-27 ENCOUNTER — Ambulatory Visit (INDEPENDENT_AMBULATORY_CARE_PROVIDER_SITE_OTHER): Payer: BLUE CROSS/BLUE SHIELD | Admitting: Family

## 2015-05-27 ENCOUNTER — Ambulatory Visit: Payer: BLUE CROSS/BLUE SHIELD | Admitting: Family

## 2015-05-27 ENCOUNTER — Encounter: Payer: Self-pay | Admitting: Family

## 2015-05-27 VITALS — BP 108/68 | HR 70 | Ht 67.0 in | Wt 175.6 lb

## 2015-05-27 DIAGNOSIS — G43009 Migraine without aura, not intractable, without status migrainosus: Secondary | ICD-10-CM | POA: Diagnosis not present

## 2015-05-27 DIAGNOSIS — G43109 Migraine with aura, not intractable, without status migrainosus: Secondary | ICD-10-CM | POA: Diagnosis not present

## 2015-05-27 DIAGNOSIS — H5310 Unspecified subjective visual disturbances: Secondary | ICD-10-CM

## 2015-05-27 DIAGNOSIS — G43809 Other migraine, not intractable, without status migrainosus: Secondary | ICD-10-CM | POA: Diagnosis not present

## 2015-05-27 DIAGNOSIS — G44219 Episodic tension-type headache, not intractable: Secondary | ICD-10-CM | POA: Diagnosis not present

## 2015-05-27 DIAGNOSIS — Z86718 Personal history of other venous thrombosis and embolism: Secondary | ICD-10-CM | POA: Diagnosis not present

## 2015-05-27 DIAGNOSIS — M542 Cervicalgia: Secondary | ICD-10-CM | POA: Diagnosis not present

## 2015-05-27 MED ORDER — TIZANIDINE HCL 4 MG PO TABS: ORAL_TABLET | ORAL | Status: DC

## 2015-05-27 MED ORDER — TOPIRAMATE 25 MG PO TABS: 75.0000 mg | ORAL_TABLET | Freq: Every day | ORAL | Status: DC

## 2015-05-27 NOTE — Patient Instructions (Signed)
For your left side neck pain, try applying ice packs, then heat. I have given you a prescription for Tizanidine, a muscle relaxant. If the medication is approved by your coach, take 1/2 to 1 tablet at bedtime as needed for neck pain or when you have a severe headache. Let me know how this works for you.   Continue taking Topiramate as you have been taking it. Remember that you should be drinking about 100 ounces of water per day. You should also consider drinking an occasional sugar free Gatorade or other sports drink on days that you work out strenuously.   Continue to keep headache diaries and send them in monthly.   You may want to have an eye evaluation to see if your vision has changed since you notice differences in acuity between the right and left eye.   You also need to eat at least 3 times per day, and have small snacks prior to exercise.   Finally, remember that Topiramate should not be taken by pregnant women.   Please plan to return for follow up in 6 months or sooner if needed.

## 2015-05-27 NOTE — Progress Notes (Signed)
Patient: Melissa Boyd MRN: [id number] Sex: female DOB: 11-24-1995  Provider: Elveria Rising, NP Location of Care: [place] [facility]  Note type: Routine return visit  History of Present Illness: Referral Source: Verne Carrow, MD History from: patient and [facility] chart Chief Complaint: Migraines  Melissa Boyd is a 19 y.o. 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 Melissa Boyd was last seen Jan 22, 2015. She has been keeping headache diaries and sending them in monthly. 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   Melissa Boyd reports today that she has not had increase in migraine frequency or severity. She brought headache diaries in that revealed 6 days of tension headaches, 2 days in which her eyes felt weird but she did not have a headache, and 23 days headache free. In August she had 5 migraines, 3 of which where severe; 10 tension headaches, 5 of which required treatment, and 16 days headache free. She had 2 days in which her eyes felt weird. On one of those, she had a moderate tension headache and she had no headache on the other day. Thus far in September, she has had 1 migraine that was severe, 5 tension headaches, one of which required treatment, and 5 days headache free. For the days that she says that her eyes feel weird, she describes it not being able to see as well out of the left eye, particularly when looking at powerpoint or other documents on a screen in a room with fluorescent light. If the lights in the room are completely off, she generally has no problems with her vision.  Melissa Boyd complains today of tightness in her left neck, and feels that sometimes the tight muscles trigger a headache. She has had no known injury to her neck. She says that sometimes the muscle tension moves upward to her head and sometimes outward toward her shoulder.  Sometimes the pain is more severe than others. There is some tenderness to the area at times.  Melissa Boyd has no other health questions or concerns today other than previously mentioned.  Review of Systems: Please see the HPI for neurologic and other pertinent review of systems. Otherwise, the following systems are noncontributory including constitutional, eyes, ears, nose and throat, cardiovascular, respiratory, gastrointestinal, genitourinary, musculoskeletal, skin, endocrine, hematologic/lymph, allergic/immunologic and psychiatric.   Past Medical History  Diagnosis Date  . Headache, migraine    Hospitalizations: No., Head Injury: No., Nervous System Infections: No., Immunizations up to date: Yes.   Past Medical History Comments: She had sports injury of strained hamstring and hip flexor, as well as strained back muscles 2014 while exercising. She had a deep vein thrombosis and a common peroneal neuropathy and foot drop at the end of last year.  Surgical History Past Surgical History  Procedure Laterality Date  . Wisdom tooth extraction    . Shoulder surgery      left    Family History family history includes Alzheimer's disease in her paternal grandfather; Anemia in her father; Hypertension in her maternal aunt. Family History is otherwise negative for migraines, seizures, cognitive impairment, blindness, deafness, birth defects, chromosomal disorder, autism.  Social History Social History   Social History  . Marital Status: Single    Spouse Name: N/A  . Number of Children: N/A  . Years of Education: N/A   Social History Main Topics  . Smoking status: Never Smoker   . Smokeless  tobacco: Never Used  . Alcohol Use: No  . Drug Use: No  . Sexual Activity: Yes    Birth Control/ Protection: Condom     Comment: lo-loestrin fe    Other Topics Concern  . 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   Melissa Boyd lives with mom and dad and some on  college campus.   Melissa Boyd enjoys track and field.   Melissa Boyd does good in school.    Allergies Allergies  Allergen Reactions  . Other Anaphylaxis    Any products that contain shellfish  . Shellfish Allergy Anaphylaxis  . Naproxen Nausea And Vomiting    Physical Exam BP 108/68 mmHg  Pulse 70  Ht 5\' 7"  (1.702 m)  Wt 175 lb 9.6 oz (79.652 kg)  BMI 27.50 kg/m2  LMP 05/17/2015 (Exact Date) General: alert, well developed, well nourished, in no acute distress, black hair, brown eyes, right handed Head: normocephalic, no dysmorphic features Ears, Nose and Throat: Otoscopic: tympanic membranes normal; pharynx: oropharynx is pink without exudates or tonsillar hypertrophy Neck: supple, full range of motion, no cranial or cervical bruits Respiratory: auscultation clear Cardiovascular: no murmurs, pulses are normal Musculoskeletal: no skeletal deformities or apparent scoliosis. She has muscle tenderness along the upper region of the left sternocleidomastoid muscle. Skin: no rashes or neurocutaneous lesions  Neurologic Exam  Mental Status: alert; oriented to person, place and year; knowledge is normal for age; language is normal Cranial Nerves: visual fields are full to double simultaneous stimuli; extraocular movements are full and conjugate; pupils are round reactive to light; funduscopic examination shows sharp disc margins with normal vessels; symmetric facial strength; midline tongue and uvula; hearing is equal and symmetric Motor: Normal strength, tone and mass; good fine motor movements; no pronator drift Sensory: intact responses to cold, vibration, proprioception and stereognosis; Except hypoesthesia in the left superficial peroneal nerve Coordination: good finger-to-nose, rapid repetitive alternating movements and finger apposition Gait and Station: Gait is normal: patient is able to walk on heels, toes and tandem without difficulty; balance is adequate; Romberg exam is negative;  Gower response is negative Reflexes: symmetric and diminished bilaterally; no clonus; bilateral flexor plantar responses  Impression 1.Migraine without aura and without status migrainosus, not intractable 2. Episodic tension type headache, not intractable 3. Left neck muscle tension and spasms 4. History of deep vein thrombosis left lower extremity 5. History of left common peroneal neuropathy  Recommendations for plan of care The patient's previous [facility] records were reviewed. Ica has neither had nor required imaging or lab studies since the last visit. Ailyne is a 19 year old young woman with history of migraine without aura, episodic tension headaches, history of deep vein thrombosis in February 2016, and history of left common peroneal neuropathy. She also complains of left neck pain today. I talked with Giorgia about her headaches and reminded her of the need for adequate hydration, especially when she exercises or competes in athletics. We talked about her neck pain, and I recommended that she alternate heat and ice, and talk with her trainer about massage to the area. I also gave her a prescription for Tizanidine to take at night when the pain is severe, and instructed her to check with her coach or trainer to be sure that it is an acceptable medication with her athletics. We talked about her eyes feeling weird, and I recommended that she have an eye evaluation as she describes it as a non-painful, transient difference in acuity  between the left and right eyeI asked her to continue to keep headache diaries and to return for follow up in 6 months or sooner if needed. Aryan agreed with these plans.   The medication list was reviewed and reconciled.  I reviewed changes that were made in the prescribed medications today.  A complete medication list was provided to the patient.  Total time spent with the patient was 30 minutes, of which 50% or more was spent in counseling and coordination of  care.

## 2015-05-29 DIAGNOSIS — H5310 Unspecified subjective visual disturbances: Secondary | ICD-10-CM | POA: Insufficient documentation

## 2015-05-30 ENCOUNTER — Other Ambulatory Visit: Payer: Self-pay | Admitting: Pediatrics

## 2015-06-20 DIAGNOSIS — L309 Dermatitis, unspecified: Secondary | ICD-10-CM | POA: Diagnosis not present

## 2015-06-28 DIAGNOSIS — J309 Allergic rhinitis, unspecified: Secondary | ICD-10-CM | POA: Insufficient documentation

## 2015-07-01 ENCOUNTER — Ambulatory Visit (INDEPENDENT_AMBULATORY_CARE_PROVIDER_SITE_OTHER): Payer: BLUE CROSS/BLUE SHIELD | Admitting: Pediatrics

## 2015-07-01 ENCOUNTER — Encounter: Payer: Self-pay | Admitting: Pediatrics

## 2015-07-01 VITALS — BP 102/66 | HR 64 | Resp 16

## 2015-07-01 DIAGNOSIS — L501 Idiopathic urticaria: Secondary | ICD-10-CM | POA: Diagnosis not present

## 2015-07-01 DIAGNOSIS — Z91018 Allergy to other foods: Secondary | ICD-10-CM

## 2015-07-01 MED ORDER — EPINEPHRINE 0.3 MG/0.3ML IJ SOAJ: 0.3000 mg | Freq: Once | INTRAMUSCULAR | Status: DC

## 2015-07-01 NOTE — Patient Instructions (Addendum)
Claritin 10 mg-loratadine-take 1 tablet once a day for itching. She may increase loratadine to twice a day if the itching is not controlled. She will see if foods that contain salicylates makes her itch. Follow-up in 2 months but she will call me she's not doing well on this treatment plan  She will avoid shellfish. If she has an allergic reaction she will take Benadryl 50 mg every 4 hours and if she has life-threatening symptoms she will inject herself with EpiPen

## 2015-07-01 NOTE — Progress Notes (Signed)
FOLLOW UP NOTE  RE: Melissa SaferBreanna Boyd MRN: [id number] DOB: 1996/04/10 [facility] [place] [address] [place] [id number] Date of Office Visit: 07/01/2015  Assessment Chief Complaint: Medication Refill and Urticaria   HPI The patient has developed hives for the past 3 or 4 months. She has hives under tight fitting areas. She continues to avoid shellfish. Cetirizine makes her sleepy Drug Allergies:  Allergies  Allergen Reactions  . Other Anaphylaxis    Any products that contain shellfish  . Shellfish Allergy Anaphylaxis  . Naproxen Nausea And Vomiting    Physical Exam: BP 102/66 mmHg  Pulse 64  Resp 16  Physical Exam  Constitutional: She appears well-developed and well-nourished.  HENT:  Eyes normal. Ears normal. Nose normal. Pharynx normal.  Neck: Neck supple. No thyromegaly present.  Cardiovascular:  S1 and S2 normal no murmurs  Pulmonary/Chest:  Clear to percussion and auscultation  Abdominal: Soft.  No hepatosplenomegaly  Lymphadenopathy:    She has no cervical adenopathy.  Skin:  Clear but there was dermographia noted  Vitals reviewed.       Assessment and Plan: 1. Idiopathic urticaria   2. Food allergy    Meds ordered this encounter  Medications  . EPINEPHrine (EPIPEN 2-PAK) 0.3 mg/0.3 mL IJ SOAJ injection    Sig: Inject 0.3 mLs (0.3 mg total) into the muscle once.    Dispense:  4 Device    Refill:  2   Patient Instructions  Claritin 10 mg-loratadine-take 1 tablet once a day for itching. She may increase loratadine to twice a day if the itching is not controlled. She will see if foods that contain salicylates makes her itch. Follow-up in 2 months but she will call me she's not doing well on this treatment plan  She will avoid shellfish. If she has an allergic reaction she will take Benadryl 50 mg every 4 hours and if she has life-threatening symptoms she will inject herself with  EpiPen   Return in about 2 months (around 08/31/2015).    Thank you for the opportunity to care for this patient.  Please do not hesitate to contact me with questions.  [facility]  [address] [place] [address] [phone]  J. Posey ReaA. Judith Campillo, M.D.

## 2015-07-16 ENCOUNTER — Telehealth: Payer: Self-pay | Admitting: Oncology

## 2015-07-16 NOTE — Telephone Encounter (Signed)
pt father came in to make appt for pt-gave appt time & date-[place] 11/18

## 2015-07-19 ENCOUNTER — Ambulatory Visit (HOSPITAL_BASED_OUTPATIENT_CLINIC_OR_DEPARTMENT_OTHER): Payer: BLUE CROSS/BLUE SHIELD | Admitting: Oncology

## 2015-07-19 VITALS — BP 114/56 | HR 59 | Temp 98.7°F | Resp 18 | Ht 67.0 in | Wt 175.7 lb

## 2015-07-19 DIAGNOSIS — I82C19 Acute embolism and thrombosis of unspecified internal jugular vein: Secondary | ICD-10-CM

## 2015-07-19 DIAGNOSIS — Z86718 Personal history of other venous thrombosis and embolism: Secondary | ICD-10-CM

## 2015-07-19 NOTE — Progress Notes (Signed)
Hematology and Oncology Follow Up Visit  Melissa SaferBreanna Boyd [id number] 1995/12/22 19 y.o. 07/19/2015 9:09 AM   Principle Diagnosis: 19 year old woman with the beta vein thrombosis diagnosed in February 2016. This was provoked by a long car ride while on oral contraceptives.   Prior Therapy: Xarelto between February 2016 in May 2016.  Current therapy: Observation and surveillance.  Interim History: Melissa Boyd JohnWarren presents today for a follow-up visit with her father. Since her last visit, she has been doing very well without any recurrent thrombosis or bleeding. She is able to walk without any difficulties. She have resumed training with some occasional knee pain but no lower extremity edema or swelling. She is planning a long trip overseas and had questions regarding her previous thrombosis episodes. She does not report any fevers, chills, sweats or weight loss. She did report chest pain but no shortness of breath, palpitation, orthopnea or leg edema. She does not report any cough, hemoptysis or wheezing. She does not report any nausea, vomiting, abdominal pain, hematochezia or melena. She does not report any frequency, urgency or hesitancy. She does report pain in her quadriceps muscle. She does not report any other skeletal pain. Rest of her review of systems unremarkable.  Medications: I have reviewed the patient's current medications.  Current Outpatient Prescriptions  Medication Sig Dispense Refill  . acetaminophen (TYLENOL) 500 MG tablet Take 1,000 mg by mouth every 6 (six) hours as needed for moderate pain.    Marland Kitchen. atenolol (TENORMIN) 25 MG tablet Take 25 mg by mouth daily.    . diphenhydrAMINE (BENADRYL) 25 mg capsule Take 25 mg by mouth every 6 (six) hours as needed.    Marland Kitchen. EPINEPHrine (EPIPEN 2-PAK) 0.3 mg/0.3 mL IJ SOAJ injection Inject 0.3 mLs (0.3 mg total) into the muscle once. 4 Device 2  . ondansetron (ZOFRAN ODT) 4 MG disintegrating tablet 4mg  ODT q4 hours prn nausea/vomit 4 tablet 0  .  SUMAtriptan (IMITREX) 100 MG tablet TAKE 1 AT ONSET OF MIGRAINE 6 tablet 5  . SUMAtriptan (IMITREX) 20 MG/ACT nasal spray ONE SPRAY INTO NOSE AT ONSET OF MIGRAINE, AND MAY REPEAT IN 2 HOURS IF HEADACHE PERSISTS 2 Act 5  . tiZANidine (ZANAFLEX) 4 MG tablet Take 1/2 to 1 tablet at bedtime as needed for neck pain or severe headache 20 tablet 1  . topiramate (TOPAMAX) 25 MG tablet Take 3 tablets (75 mg total) by mouth at bedtime. 93 tablet 5   No current facility-administered medications for this visit.     Allergies:  Allergies  Allergen Reactions  . Other Anaphylaxis    Any products that contain shellfish  . Shellfish Allergy Anaphylaxis  . Naproxen Nausea And Vomiting    Past Medical History, Surgical history, Social history, and Family History were reviewed and updated.   Physical Exam: Blood pressure 114/56, pulse 59, temperature 98.7 F (37.1 C), temperature source Oral, resp. rate 18, height 5\' 7"  (1.702 m), weight 175 lb 11.2 oz (79.697 kg), SpO2 100 %. ECOG: 0 General appearance: alert and cooperative well-appearing without distress. Head: Normocephalic, without obvious abnormality no oral ulcers or lesions Neck: no adenopathy Lymph nodes: Cervical, supraclavicular, and axillary nodes normal. Heart:regular rate and rhythm, S1, S2 normal, no murmur, click, rub or gallop Lung:chest clear, no wheezing, rales, normal symmetric air entry Abdomin: soft, non-tender, without masses or organomegaly EXT:no erythema, induration, or nodules   Lab Results: Lab Results  Component Value Date   WBC 4.3 12/23/2014   HGB 13.2 12/23/2014   HCT 38.1 12/23/2014  MCV 98.7 12/23/2014   PLT 186 12/23/2014     Chemistry      Component Value Date/Time   NA 140 12/23/2014 1433   NA 140 08/02/2014 1842   K 3.5 12/23/2014 1433   K 3.8 08/02/2014 1842   CL 108 12/23/2014 1433   CL 109* 08/02/2014 1842   CO2 24 12/23/2014 1433   CO2 24 08/02/2014 1842   BUN 14 12/23/2014 1433   BUN 15  08/02/2014 1842   CREATININE 1.00 12/23/2014 1433   CREATININE 1.05 08/02/2014 1842      Component Value Date/Time   CALCIUM 8.7 12/23/2014 1433   CALCIUM 8.0* 08/02/2014 1842   ALKPHOS 46 10/27/2013 0110   AST 18 10/27/2013 0110   ALT 12 10/27/2013 0110   BILITOT 0.3 10/27/2013 0110         Impression and Plan:  20 year old woman with the following issues:  1. 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 The blood clot was provoked by a long stride over 10 hours to participate in athletic event as well as oral contraception. She completed 3 months of anticoagulation and follow-up Doppler showed resolution of her blood clot.  She is planning a long plane ride in the near future and would like to have recommendation to prevent repeat thrombosis episode. Have recommended adequate hydration prior and during the flight. Have also recommended and slight leg exercises as well as walking in the [place]le every 2-3 hours. I will also recommended low-dose aspirin 81 mg on the day of the flight. I do not see the need for full dose anticoagulation during the flight. Compression stockings would also be reasonable at this time and would not necessarily increase her risk of thrombosis.  2. Contraception: She is currently not using any estrogen-based contraception.  I will be happy to see her in the future as needed.  [facility], MD 11/4/20169:09 AM

## 2015-09-10 ENCOUNTER — Telehealth: Payer: Self-pay | Admitting: *Deleted

## 2015-09-10 ENCOUNTER — Encounter: Payer: Self-pay | Admitting: Podiatry

## 2015-09-10 ENCOUNTER — Ambulatory Visit (INDEPENDENT_AMBULATORY_CARE_PROVIDER_SITE_OTHER): Payer: BLUE CROSS/BLUE SHIELD

## 2015-09-10 ENCOUNTER — Ambulatory Visit (INDEPENDENT_AMBULATORY_CARE_PROVIDER_SITE_OTHER): Payer: BLUE CROSS/BLUE SHIELD | Admitting: Podiatry

## 2015-09-10 VITALS — BP 104/66 | HR 63 | Resp 16

## 2015-09-10 DIAGNOSIS — M2010 Hallux valgus (acquired), unspecified foot: Secondary | ICD-10-CM

## 2015-09-10 DIAGNOSIS — M2040 Other hammer toe(s) (acquired), unspecified foot: Secondary | ICD-10-CM | POA: Diagnosis not present

## 2015-09-10 DIAGNOSIS — R002 Palpitations: Secondary | ICD-10-CM | POA: Insufficient documentation

## 2015-09-10 NOTE — Telephone Encounter (Addendum)
Pt's ftr states pt has approval from insurance to have orthotics made at [facility] on [address].  I spoke with pt's ftr, informed that Dr. Al CorpusHyatt was under the impression pt would get orthotics from her trainer, but would prefer her have prescription orthotics from our office.  Insurance checked by receptionist and the BC[employer] would cover 90%, leaving pt pay out of pocket between $50-150.00.

## 2015-09-10 NOTE — Progress Notes (Signed)
   Subjective:    Patient ID: Melissa Boyd, female    DOB: 05/17/1996, 19 y.o.   MRN: [id number]  HPI : she presents today with her father as a 19 year old female with a history of painful fifth toes and worsening bunions for several years. 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 She states that they seem to rub her toes because difficulty with other shoe gear. Currently she seeks pedicures to help reduce the thickness of her corns and calluses. She's noticed that he rapid development of the bunion deformity right foot greater than left. She is here today requesting information regarding these deformities. She states that she has orthotics routinely made  By her athletic department to go in her track shoes.    Review of Systems  Cardiovascular: Positive for leg swelling.  Allergic/Immunologic: Positive for food allergies.  All other systems reviewed and are negative.      Objective:   Physical Exam : 19 year old African-American female in no apparent distress attending [employer]. Vital signs are stable she is alert and oriented 3 pulses are strongly palpable. Neurologic sensorium is intact per Semmes-Weinstein monofilament. Deep tendon reflexes are intact. Muscle strength +5 over 5 dorsiflexes plantar flexors and inverters everters all intrinsic musculature is intact. Orthopedic evaluation of his resolved joints distal to the angle full range of motion without crepitus mild hallux valgus deformity right foot greater than the left. Hammertoe deformities fifth bilateral and an adductovarus rotation resulting in a prominent head of the proximal phalanx the reactive hyperkeratoses. No open lesions or wounds are noted today radiographs do confirm hallux valgus deformity and hammertoe deformities as mentioned previously.        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 Hammertoe deformity fifth digit lateral.  Plan:  discussed etiology pathology conservative versus surgical therapies. 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 However I did recommend that she have a derotational arthroplasty to the fifth digits as long as she had 6-8 weeks to allow them to heal. She states that she does not have time at this point. We dispensed padding as well as premade bunion pads. I will follow-up with her on an as-needed basis.

## 2015-10-29 ENCOUNTER — Encounter (HOSPITAL_COMMUNITY): Payer: Self-pay | Admitting: Emergency Medicine

## 2015-10-29 ENCOUNTER — Emergency Department (HOSPITAL_COMMUNITY)
Admission: EM | Admit: 2015-10-29 | Discharge: 2015-10-30 | Disposition: A | Discharge status: Home / Self Care | Payer: BLUE CROSS/BLUE SHIELD | Attending: Emergency Medicine | Admitting: Emergency Medicine

## 2015-10-29 DIAGNOSIS — L989 Disorder of the skin and subcutaneous tissue, unspecified: Secondary | ICD-10-CM | POA: Insufficient documentation

## 2015-10-29 NOTE — ED Notes (Signed)
Pt states that she first noticed a reddened, raised area on the upper leg below her buttocks yesterday. Area is raised, and hot to the touch. Alert and oriented. Denies SOB. Hx of blood clots in same leg.

## 2015-10-29 NOTE — ED Notes (Signed)
Pt's father came in and stated that pt would f/u with her doctor at school tomorrow. Told to return if pt became SOB or felt worse or to call 911

## 2015-10-29 NOTE — ED Notes (Signed)
Sharilyn Sites PA looked at pt for second opinion.

## 2015-11-08 ENCOUNTER — Ambulatory Visit: Payer: BLUE CROSS/BLUE SHIELD | Admitting: Allergy and Immunology

## 2015-11-25 ENCOUNTER — Telehealth: Payer: Self-pay | Admitting: Pediatrics

## 2015-11-25 NOTE — Telephone Encounter (Signed)
Headache calendar from January 2017 on [name]. 16 days were recorded.  11 days were headache free.  5 days were associated with tension type headaches, 2 required treatment.  There were no days of migraines.  Headache calendar from February 2017 on [name]. 5 days were recorded.  3 days were headache free.  2 days were associated with tension type headaches, none required treatment.  There were no days of migraines.  There is no reason to change current treatment.  Please contact the family.

## 2015-11-26 NOTE — Telephone Encounter (Signed)
I have been unable to reach Melissa Boyd by phone. The phone gives a message saying that the voicemail is full and cannot accept messages. I will mail her a letter asking her to call me back. TG

## 2015-12-10 ENCOUNTER — Telehealth: Payer: Self-pay

## 2015-12-10 NOTE — Telephone Encounter (Signed)
Patient Melissa Boyd stating [facility] and that she also has her HA diary. CB# [phone]

## 2015-12-10 NOTE — Telephone Encounter (Signed)
I called and talked with Melissa Boyd about the Jan and Feb diaries that she had sent in. She said that she also had a migraine around the first of March, and then again yesterday. She admitted that she has missed several doses of Topiramate, and I talked with her about how to get back on schedule with that. I reminded her about the need for her to be well hydrated while taking Topiramate. I will mail her more headache diaries. TG

## 2015-12-10 NOTE — Telephone Encounter (Signed)
I reviewed your note and agree with your recommendations to the patient.

## 2015-12-20 ENCOUNTER — Encounter: Payer: Self-pay | Admitting: Family

## 2015-12-21 ENCOUNTER — Telehealth: Payer: Self-pay | Admitting: Pediatrics

## 2015-12-21 NOTE — Telephone Encounter (Signed)
Headache calendar from March 2017 on Melissa Boyd. 31 days were recorded.  13 days were headache free.  13 days were associated with tension type headaches, 7 required treatment.  There were 5 days of migraines, 3 were severe.  This is a marked increase in migraines.  Please contact the patient and let me know what is happening.

## 2015-12-23 NOTE — Telephone Encounter (Signed)
I left a message for Melissa Boyd and asked her to call me back. TG

## 2015-12-30 NOTE — Telephone Encounter (Signed)
Melissa Boyd called me back to discuss her March headache diary. She said that for part of the migraines where they occurred back to back, [place]. She said that things have been better this month so far and that she doesn't want to make a change in her medication at this time. 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 I asked her to continue to send in headache diaries each month. Melissa Boyd agreed with these plans. TG

## 2015-12-30 NOTE — Telephone Encounter (Signed)
I reviewed your note and agree.

## 2016-02-08 ENCOUNTER — Other Ambulatory Visit: Payer: Self-pay | Admitting: Family

## 2016-02-11 ENCOUNTER — Encounter: Payer: Self-pay | Admitting: Family

## 2016-02-18 ENCOUNTER — Ambulatory Visit (INDEPENDENT_AMBULATORY_CARE_PROVIDER_SITE_OTHER): Payer: BLUE CROSS/BLUE SHIELD | Admitting: Family

## 2016-02-18 ENCOUNTER — Encounter: Payer: Self-pay | Admitting: Family

## 2016-02-18 VITALS — BP 110/70 | HR 68 | Ht 67.5 in | Wt 180.0 lb

## 2016-02-18 DIAGNOSIS — G43009 Migraine without aura, not intractable, without status migrainosus: Secondary | ICD-10-CM

## 2016-02-18 DIAGNOSIS — Z86718 Personal history of other venous thrombosis and embolism: Secondary | ICD-10-CM

## 2016-02-18 DIAGNOSIS — G43809 Other migraine, not intractable, without status migrainosus: Secondary | ICD-10-CM | POA: Diagnosis not present

## 2016-02-18 DIAGNOSIS — G44219 Episodic tension-type headache, not intractable: Secondary | ICD-10-CM | POA: Diagnosis not present

## 2016-02-18 DIAGNOSIS — G43109 Migraine with aura, not intractable, without status migrainosus: Secondary | ICD-10-CM

## 2016-02-18 NOTE — Progress Notes (Signed)
Patient: Melissa SaferBreanna Surowiec MRN: [id number] Sex: female DOB: Jul 16, 1996  Provider: Elveria Risingina Kirubel Aja, NP [facility]  Note type: Routine return visit  History of Present Illness: Referral Source: Dr. Verne CarrowWilliam Young History from: patient, referring office and [facility] Chief Complaint: Migraines   Melissa Boyd is a 20 y.o. 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 Melissa Boyd was last seen May 27, 2015. She has been keeping headache diaries and sending them in monthly. She has been taking and tolerating Topiramate for migraine prevention. 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   Melissa Boyd reports today that she has not had increase in migraine frequency or severity. She brought her May headache diary. In May, she had 1 migraine and 8 tension headaches, 3 of which required treatment. Thus far in June she has had 1 migraine and 3 tension headaches, 1 of which required treatment. On some days on her diary, Melissa Boyd reports that her eyes feel weird. For the days that she says that her eyes feel weird, she describes it not being able to see as well out of the left eye, particularly when looking at powerpoint or other documents on a screen in a room with fluorescent light. If the lights in the room are completely off, she generally has no problems with her vision.  Melissa Boyd has been otherwise healthy since she was last seen. She says that sometimes her left knee is slightly edematous for reasons that are unclear. She is looking forward to an upcoming trip to [place] in a few weeks. She will return to [employer] in the fall for one more year to pursue a second degree, then will apply to graduate school. Melissa Boyd has no other health questions or concerns today other than previously mentioned.  Review of Systems: Please see the  HPI for neurologic and other pertinent review of systems. Otherwise, the following systems are noncontributory including constitutional, eyes, ears, nose and throat, cardiovascular, respiratory, gastrointestinal, genitourinary, musculoskeletal, skin, endocrine, hematologic/lymph, allergic/immunologic and psychiatric.   Past Medical History  Diagnosis Date  . Headache, migraine   . Urticaria    Hospitalizations: No., Head Injury: No., Nervous System Infections: No., Immunizations up to date: Yes.   Past Medical History Comments: She had sports injury of strained hamstring and hip flexor, as well as strained back muscles 2014 while exercising. She had a deep vein thrombosis and a common peroneal neuropathy and foot drop at the end of last year.  Surgical History Past Surgical History  Procedure Laterality Date  . Wisdom tooth extraction    . Shoulder surgery      left    Family History family history includes Alzheimer's disease in her paternal grandfather; Anemia in her father; Hypertension in her maternal aunt. Family History is otherwise negative for migraines, seizures, cognitive impairment, blindness, deafness, birth defects, chromosomal disorder, autism.  Social History Social History   Social History  . Marital Status: Single    Spouse Name: N/A  . Number of Children: N/A  . Years of Education: N/A   Social History Main Topics  . Smoking status: Never Smoker   . Smokeless tobacco: Never Used  . Alcohol Use: No  . Drug Use: No  . Sexual Activity: Yes    Birth Control/ Protection: Condom     Comment: lo-loestrin fe    Other Topics Concern  . 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 She plans on going back to school for PT. She enjoys track and field.   Marvin lives with mom and dad and some on college campus.          Allergies Allergies  Allergen Reactions  . Other Anaphylaxis    Any  products that contain shellfish  . Shellfish Allergy Anaphylaxis  . Naproxen Nausea And Vomiting    Physical Exam BP 110/70 mmHg  Pulse 68  Ht 5' 7.5" (1.715 m)  Wt 180 lb (81.647 kg)  BMI 27.76 kg/m2  LMP 01/30/2016 General: alert, well developed, well nourished, in no acute distress, black hair, brown eyes, right handed Head: normocephalic, no dysmorphic features Ears, Nose and Throat: Otoscopic: tympanic membranes normal; pharynx: oropharynx is pink without exudates or tonsillar hypertrophy Neck: supple, full range of motion, no cranial or cervical bruits Respiratory: auscultation clear Cardiovascular: no murmurs, pulses are normal Musculoskeletal: no skeletal deformities or apparent scoliosis. She has muscle tenderness along the upper region of the left sternocleidomastoid muscle. Skin: no rashes or neurocutaneous lesions  Neurologic Exam  Mental Status: alert; oriented to person, place and year; knowledge is normal for age; language is normal Cranial Nerves: visual fields are full to double simultaneous stimuli; extraocular movements are full and conjugate; pupils are round reactive to light; funduscopic examination shows sharp disc margins with normal vessels; symmetric facial strength; midline tongue and uvula; hearing is equal and symmetric Motor: Normal strength, tone and mass; good fine motor movements; no pronator drift Sensory: intact responses to cold, vibration, proprioception and stereognosis; Except hypoesthesia in the left superficial peroneal nerve Coordination: good finger-to-nose, rapid repetitive alternating movements and finger apposition Gait and Station: Gait is normal: patient is able to walk on heels, toes and tandem without difficulty; balance is adequate; Romberg exam is negative; Gower response is negative Reflexes: symmetric and diminished bilaterally; no clonus; bilateral flexor plantar responses  Impression 1.  Migraine without aura and without status  migrainosus, not intractable 2. Episodic tension type headache, not intractable 3. Left neck muscle tension and spasms 4. History of deep vein thrombosis left lower extremity 5. History of left common peroneal neuropathy  Recommendations for plan of care The patient's previous [facility] records were reviewed. Kaylyne has neither had nor required imaging or lab studies since the last visit. She is a 20 year old young woman with history of migraine without aura, episodic tension headaches, history of deep vein thrombosis in February 2016, and history of left common peroneal neuropathy. She is taking and tolerating Topiramate for migraine prevention and will continue at the same dose for now. I reminded her of the need to be well hydrated while taking this medication. I asked Shammara to continue to keep headache diaries and to return for follow up in 6 months or sooner if needed. I gave her an updated letter for her school regarding her condition.   The medication list was reviewed and reconciled.  No changes were made in the prescribed medications today.  A complete medication list was provided to the patient.     Medication List       This list is accurate as of: 02/18/16 11:59 AM.  Always use your most recent med list.               acetaminophen 500 MG tablet  Commonly known as:  TYLENOL  Take 1,000 mg by mouth every 6 (six) hours  as needed for moderate pain.     EPINEPHrine 0.3 mg/0.3 mL Soaj injection  Commonly known as:  EPIPEN 2-PAK  Inject 0.3 mLs (0.3 mg total) into the muscle once.     ibuprofen 200 MG tablet  Commonly known as:  ADVIL,MOTRIN  Take 800 mg by mouth every 6 (six) hours as needed for moderate pain.     multivitamin with minerals Tabs tablet  Take 1 tablet by mouth daily.     ondansetron 4 MG disintegrating tablet  Commonly known as:  ZOFRAN ODT  4mg  ODT q4 hours prn nausea/vomit     OVER THE COUNTER MEDICATION  Take 1 tablet by mouth daily.     SUMAtriptan  100 MG tablet  Commonly known as:  IMITREX  TAKE 1 AT ONSET OF MIGRAINE     SUMAtriptan 20 MG/ACT nasal spray  Commonly known as:  IMITREX  ONE SPRAY INTO NOSE AT ONSET OF MIGRAINE, AND MAY REPEAT IN 2 HOURS IF HEADACHE PERSISTS     tiZANidine 4 MG tablet  Commonly known as:  ZANAFLEX  Take 1/2 to 1 tablet at bedtime as needed for neck pain or severe headache     topiramate 25 MG tablet  Commonly known as:  TOPAMAX  TAKE 3 TABLETS (75 MG TOTAL) BY MOUTH AT BEDTIME.        Dr. Sharene Skeans was consulted regarding the patient.   Total time spent with the patient was 25 minutes, of which 50% or more was spent in counseling and coordination of care.   Elveria Rising

## 2016-02-20 NOTE — Patient Instructions (Signed)
Continue taking Topiramate as you have been doing. Keep track of your headaches with the headache diary and send it in monthly. Remember that you need to be very well hydrated while taking this medication. Also remember that Topiramate should not be taken by pregnant women.   Please plan to return for follow up in 6 months or sooner if needed.

## 2016-03-20 ENCOUNTER — Emergency Department (HOSPITAL_BASED_OUTPATIENT_CLINIC_OR_DEPARTMENT_OTHER)
Admit: 2016-03-20 | Discharge: 2016-03-20 | Disposition: A | Discharge status: Home / Self Care | Payer: BLUE CROSS/BLUE SHIELD

## 2016-03-20 ENCOUNTER — Emergency Department (HOSPITAL_COMMUNITY)
Admission: EM | Admit: 2016-03-20 | Discharge: 2016-03-20 | Disposition: A | Discharge status: Home / Self Care | Payer: BLUE CROSS/BLUE SHIELD | Attending: Emergency Medicine | Admitting: Emergency Medicine

## 2016-03-20 ENCOUNTER — Encounter (HOSPITAL_COMMUNITY): Payer: Self-pay | Admitting: Emergency Medicine

## 2016-03-20 DIAGNOSIS — S86912A Strain of unspecified muscle(s) and tendon(s) at lower leg level, left leg, initial encounter: Secondary | ICD-10-CM | POA: Insufficient documentation

## 2016-03-20 DIAGNOSIS — Z86718 Personal history of other venous thrombosis and embolism: Secondary | ICD-10-CM | POA: Insufficient documentation

## 2016-03-20 DIAGNOSIS — Y999 Unspecified external cause status: Secondary | ICD-10-CM | POA: Insufficient documentation

## 2016-03-20 DIAGNOSIS — Z91013 Allergy to seafood: Secondary | ICD-10-CM | POA: Diagnosis not present

## 2016-03-20 DIAGNOSIS — S8992XA Unspecified injury of left lower leg, initial encounter: Secondary | ICD-10-CM | POA: Diagnosis present

## 2016-03-20 DIAGNOSIS — Y939 Activity, unspecified: Secondary | ICD-10-CM | POA: Insufficient documentation

## 2016-03-20 DIAGNOSIS — T148XXA Other injury of unspecified body region, initial encounter: Secondary | ICD-10-CM

## 2016-03-20 DIAGNOSIS — X58XXXA Exposure to other specified factors, initial encounter: Secondary | ICD-10-CM | POA: Diagnosis not present

## 2016-03-20 DIAGNOSIS — Y929 Unspecified place or not applicable: Secondary | ICD-10-CM | POA: Diagnosis not present

## 2016-03-20 DIAGNOSIS — M79609 Pain in unspecified limb: Secondary | ICD-10-CM

## 2016-03-20 DIAGNOSIS — Z79899 Other long term (current) drug therapy: Secondary | ICD-10-CM | POA: Insufficient documentation

## 2016-03-20 HISTORY — DX: Acute embolism and thrombosis of unspecified deep veins of unspecified lower extremity: I82.409

## 2016-03-20 MED ORDER — IBUPROFEN 600 MG PO TABS: 600.0000 mg | ORAL_TABLET | Freq: Four times a day (QID) | ORAL | Status: DC | PRN

## 2016-03-20 MED ORDER — METHOCARBAMOL 750 MG PO TABS: 750.0000 mg | ORAL_TABLET | Freq: Four times a day (QID) | ORAL | Status: DC

## 2016-03-20 NOTE — ED Notes (Signed)
Pt c/o left leg pain onset last night. Pt states it is an achy feeling and feels similar to when she had a blood clot before.

## 2016-03-20 NOTE — Progress Notes (Signed)
*  Preliminary Results* Left lower extremity venous duplex completed. Left lower extremity is negative for deep vein thrombosis. There is no evidence of left Baker's cyst.  03/20/2016 5:13 PM  Gertie FeyMichelle Tomislav Micale, RVT, RDCS, RDMS

## 2016-03-20 NOTE — ED Notes (Signed)
Bed: WA29 Expected date:  Expected time:  Means of arrival:  Comments: Broadus JohnWarren: Vascular

## 2016-03-20 NOTE — ED Provider Notes (Signed)
CSN: [id number]     Arrival date & time 03/20/16  1559 History   First MD Initiated Contact with Patient 03/20/16 1642     Chief Complaint  Patient presents with  . Leg Pain     (Consider location/radiation/quality/duration/timing/severity/associated sxs/prior Treatment) HPI Comments: Patient here complaining of 24-hour history of left calf pain characterized as crampy and worse with standing. Patient does have a history of DVT 2 years ago and this feels similar. There was no etiology of her low clot from that episode. Patient recently just returned back from the beach. Denies any chest pain or shortness of breath. Denies any injury. Symptoms are better with remaining still. No treatment use prior to arrival  Patient is a 20 y.o. female presenting with leg pain. The history is provided by the patient.  Leg Pain   Past Medical History  Diagnosis Date  . Headache, migraine   . Urticaria   . DVT (deep venous thrombosis) [facility](HCC)    Past Surgical History  Procedure Laterality Date  . Wisdom tooth extraction    . Shoulder surgery      left   Family History  Problem Relation Age of Onset  . Anemia Father     low iron   . Hypertension Maternal Aunt   . Alzheimer's disease Paternal Grandfather    Social History  Substance Use Topics  . Smoking status: Never Smoker   . Smokeless tobacco: Never Used  . Alcohol Use: No   OB History    Gravida Para Term Preterm AB TAB SAB Ectopic Multiple Living   0              Review of Systems  All other systems reviewed and are negative.     Allergies  Other; Shellfish allergy; and Naproxen  Home Medications   Prior to Admission medications   Medication Sig Start Date End Date Taking? Authorizing Provider  acetaminophen (TYLENOL) 500 MG tablet Take 1,000 mg by mouth every 6 (six) hours as needed for moderate pain.    Historical Provider, MD  EPINEPHrine (EPIPEN 2-PAK) 0.3 mg/0.3 mL IJ SOAJ injection Inject 0.3 mLs (0.3 mg total) into  the muscle once. 07/01/15   Fletcher AnonJose A Bardelas, MD  ibuprofen (ADVIL,MOTRIN) 200 MG tablet Take 800 mg by mouth every 6 (six) hours as needed for moderate pain.    Historical Provider, MD  Multiple Vitamin (MULTIVITAMIN WITH MINERALS) TABS tablet Take 1 tablet by mouth daily.    Historical Provider, MD  ondansetron (ZOFRAN ODT) 4 MG disintegrating tablet 4mg  ODT q4 hours prn nausea/vomit 10/27/13   Robyn M Hess, PA-C  OVER THE COUNTER MEDICATION Take 1 tablet by mouth daily.    Historical Provider, MD  SUMAtriptan (IMITREX) 100 MG tablet TAKE 1 AT ONSET OF MIGRAINE 03/06/15   Elveria Risingina Goodpasture, NP  SUMAtriptan (IMITREX) 20 MG/ACT nasal spray ONE SPRAY INTO NOSE AT ONSET OF MIGRAINE, AND MAY REPEAT IN 2 HOURS IF HEADACHE PERSISTS 09/10/14   Deetta PerlaWilliam H Hickling, MD  tiZANidine (ZANAFLEX) 4 MG tablet Take 1/2 to 1 tablet at bedtime as needed for neck pain or severe headache 05/27/15   Elveria Risingina Goodpasture, NP  topiramate (TOPAMAX) 25 MG tablet TAKE 3 TABLETS (75 MG TOTAL) BY MOUTH AT BEDTIME. 02/11/16   Elveria Risingina Goodpasture, NP   BP 133/82 mmHg  Pulse 82  Temp(Src) 98.9 F (37.2 C) (Oral)  Resp 16  SpO2 100% Physical Exam  Constitutional: She is oriented to person, place, and time. She appears well-developed  and well-nourished.  Non-toxic appearance. No distress.  HENT:  Head: Normocephalic and atraumatic.  Eyes: Conjunctivae, EOM and lids are normal. Pupils are equal, round, and reactive to light.  Neck: Normal range of motion. Neck supple. No tracheal deviation present. No thyroid mass present.  Cardiovascular: Normal rate, regular rhythm and normal heart sounds.  Exam reveals no gallop.   No murmur heard. Pulmonary/Chest: Effort normal and breath sounds normal. No stridor. No respiratory distress. She has no decreased breath sounds. She has no wheezes. She has no rhonchi. She has no rales.  Abdominal: Soft. Normal appearance and bowel sounds are normal. She exhibits no distension. There is no tenderness.  There is no rebound and no CVA tenderness.  Musculoskeletal: Normal range of motion. She exhibits no edema or tenderness.       Legs: Neurological: She is alert and oriented to person, place, and time. She has normal strength. No cranial nerve deficit or sensory deficit. GCS eye subscore is 4. GCS verbal subscore is 5. GCS motor subscore is 6.  Skin: Skin is warm and dry. No abrasion and no rash noted.  Psychiatric: She has a normal mood and affect. Her speech is normal and behavior is normal.  Nursing note and vitals reviewed.   ED Course  Procedures (including critical care time) Labs Review Labs Reviewed - No data to display  Imaging Review No results found. I have personally reviewed and evaluated these images and lab results as part of my medical decision-making.   EKG Interpretation None      MDM   Final diagnoses:  None    Lower extremity Doppler negative for DVT per vascular technologist. Patient likely musculoskeletal strain and will treat with muscle relaxants and anti-inflammatories    Lorre NickAnthony Luma Clopper, MD 03/20/16 1656

## 2016-03-20 NOTE — Discharge Instructions (Signed)

## 2016-03-23 ENCOUNTER — Telehealth: Payer: Self-pay | Admitting: Nurse Practitioner

## 2016-03-23 NOTE — Telephone Encounter (Signed)
Patient walked into the cancer Center at about 5:15 PM this evening.  She states she just left the emergency department prior to that.  She states that she has a history of a DVT in the past; and is complaining of leg pain for the past 24 hours.  She denies any known injury or trauma to the area.  She denies any chest pain, chest pressure, shortness breath, or pain with inspiration.  She states that she had a full evaluation of her leg pain while in the emergency department just now; and the Doppler ultrasound of the leg was negative for DVT.  She was diagnosed with a muscle strain only.  Advised patient that a negative Doppler ultrasound was a definitive way to rule out a blood clot to that leg.  Advice patient to further follow up with her primary care physician or a local urgent care for any other follow-up.  She may need regarding her new onset leg pain.  She may also need to consider an orthopedic referral if the pain persist.  Also, advised patient that the cancer Center would be very willing to see her for further evaluation during regular business hours.    Patient was advised to return to the emergency department for any worsening symptoms whatsoever.

## 2016-03-28 ENCOUNTER — Other Ambulatory Visit: Payer: Self-pay | Admitting: Family

## 2016-04-04 ENCOUNTER — Telehealth: Payer: Self-pay | Admitting: Pediatrics

## 2016-04-04 NOTE — Telephone Encounter (Signed)
Headache calendar from June 2017 on [place]. 30 days were recorded.  15 days were headache free.  13 days were associated with tension type headaches, 7 required treatment.  There were 2 days of migraines, 1 was severe.  She had one day of blurred vision with tension headache. There is no reason to change current treatment.  Please contact Jeralyn.  Let me know if you want me to send My Chart messages to her.

## 2016-04-06 ENCOUNTER — Encounter: Payer: Self-pay | Admitting: Family

## 2016-04-06 NOTE — Telephone Encounter (Signed)
I sent this information to [facility] via MyChart. TG

## 2016-05-19 ENCOUNTER — Telehealth: Payer: Self-pay | Admitting: Family

## 2016-05-19 NOTE — Telephone Encounter (Signed)
Noted, I agree.

## 2016-05-19 NOTE — Telephone Encounter (Signed)
I called Melissa Boyd and talked with her about the August headache diary. She had 11 days headache free, 17 days of tension headaches - 6 of which required treatment, and 1 migraine. With one tension headache and with the migraine she experienced blurred vision. I reminded Melissa Boyd about being well hydrated and getting enough sleep. 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 I asked her to continue to send in headache diaries and to call or send a MyChart message if she has any concerns. She agreed and asked me to mail her headache diaries, which I will do. TG

## 2016-06-30 ENCOUNTER — Telehealth (INDEPENDENT_AMBULATORY_CARE_PROVIDER_SITE_OTHER): Payer: Self-pay | Admitting: Pediatrics

## 2016-06-30 NOTE — Telephone Encounter (Signed)
Headache calendar from September 2017 on Melissa Boyd. 30 days were recorded.  11 days were headache free.  18 days were associated with tension type headaches, 11 required treatment.  There was 1 day of migraines, none were severe.  I note on 3 days there as blurry vision.  Is this a visual aura?  There is no reason to change current treatment.  Please contact Timisha through My Chart.

## 2016-07-01 NOTE — Telephone Encounter (Signed)
I reviewed your note and agree with this plan. 

## 2016-07-01 NOTE — Telephone Encounter (Signed)
I called and talked to Melissa Boyd. She said that she always had blurry vision prior to a migraine and that if she took Tylenol or sometimes ate a piece of peppermint that she could sometimes stop the migraine pain from occurring and that the blurry vision would stop. If she did not take medication or eat peppermint that the pain would occur. I told her to continue to treat the blurry vision as soon as it occurred and explained visual aura in migraine. [name] agreed with the plans and will send in October diary when finished. TG

## 2016-08-31 IMAGING — MR MR FEMUR*L* W/O CM
4 of 6 series · 16 of 40 positions shown · non-contrast
Comparison: None.

CLINICAL DATA: Left leg numbness and left foot drop.

EXAM:
MR OF THE LEFT FEMUR WITHOUT CONTRAST
TECHNIQUE: Multiplanar, multisequence MR imaging was performed. No intravenous
contrast was administered.

[Series 3: T1 · axial · 7.0mm · 0.59mm/px · z∈[-138,+89]mm · 7 of 33 slices shown (1 of 3)]
[im 1/33]
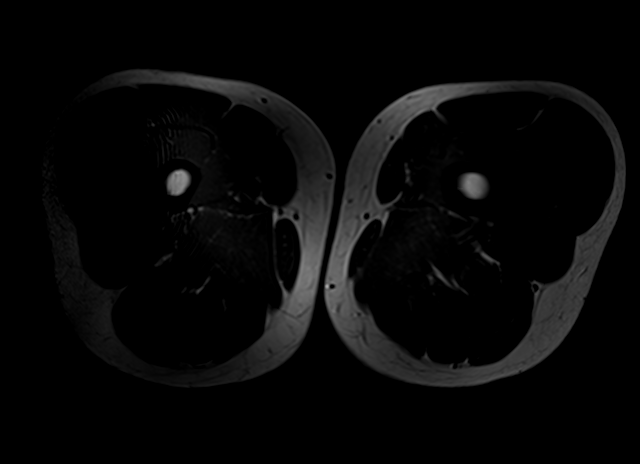
[im 5/33]
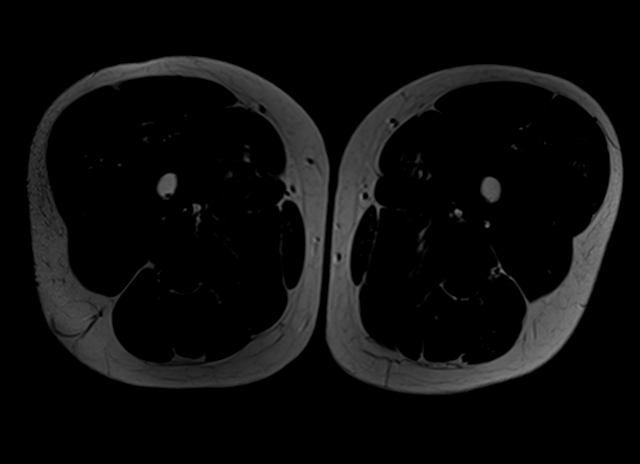
[im 10/33]
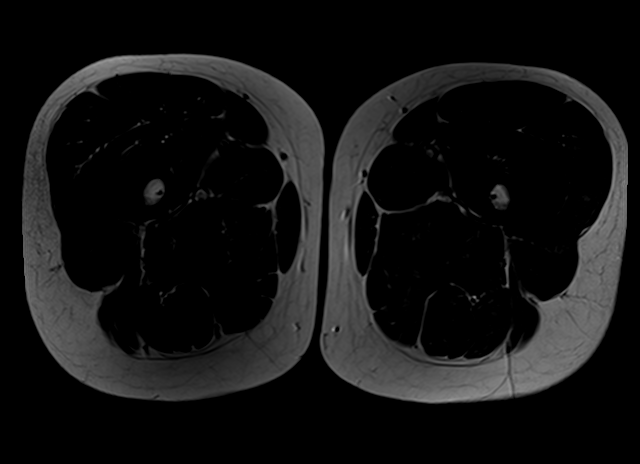
[im 14/33]
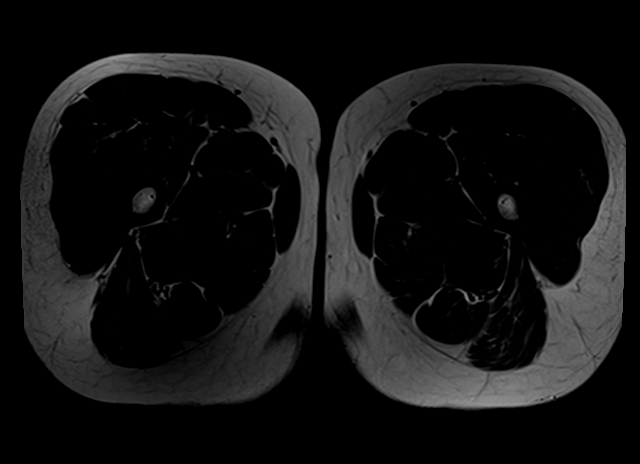
[im 19/33]
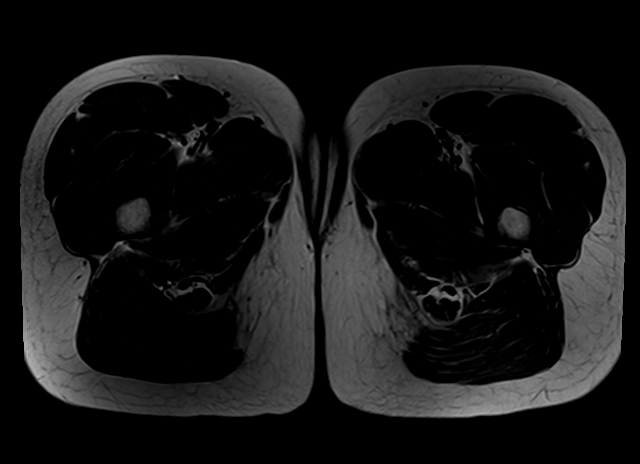
[im 23/33]
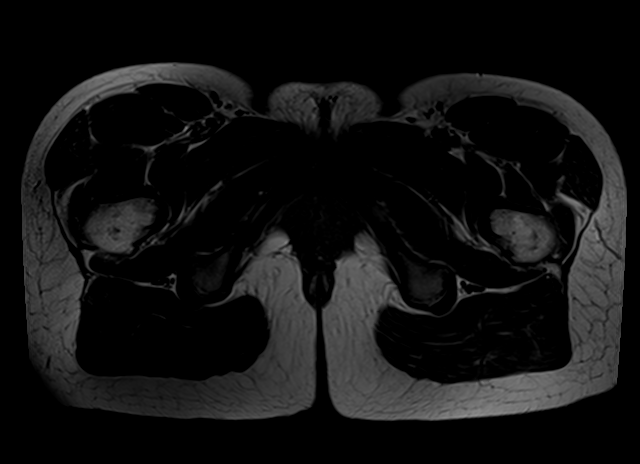
[im 28/33]
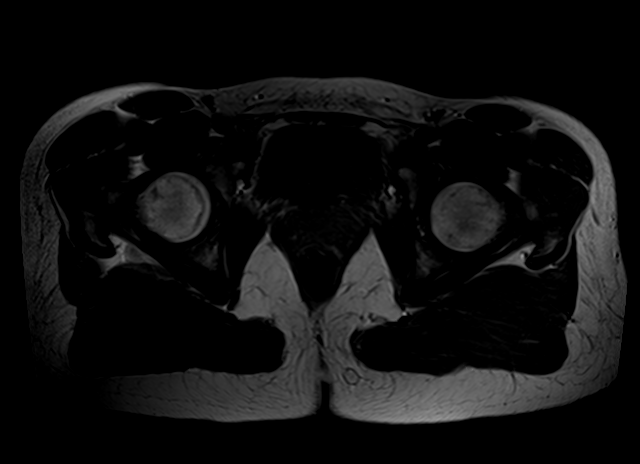

[Series 4: STIR · axial · 7.0mm · 0.74mm/px · z∈[-104,+89]mm · 3 of 33 slices shown]
[im 5/33]
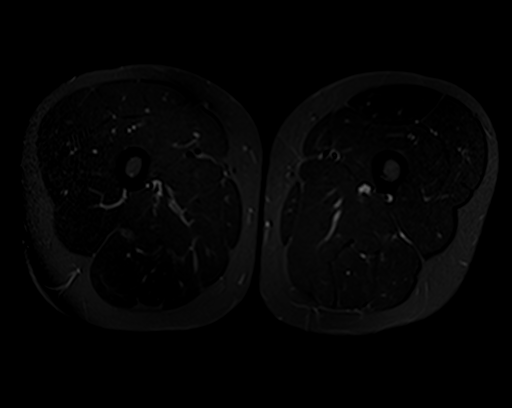
[im 19/33]
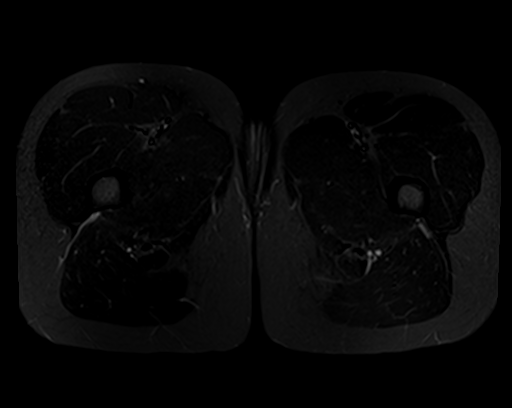
[im 28/33]
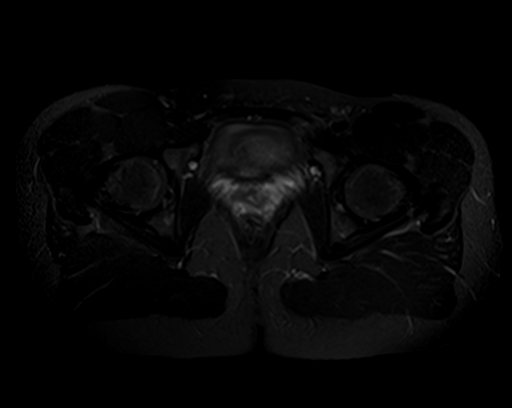

[Series 6: T1 · coronal · 4.0mm · 0.47mm/px · 3 of 27 slices shown (2 of 3)]
[im 6/27]
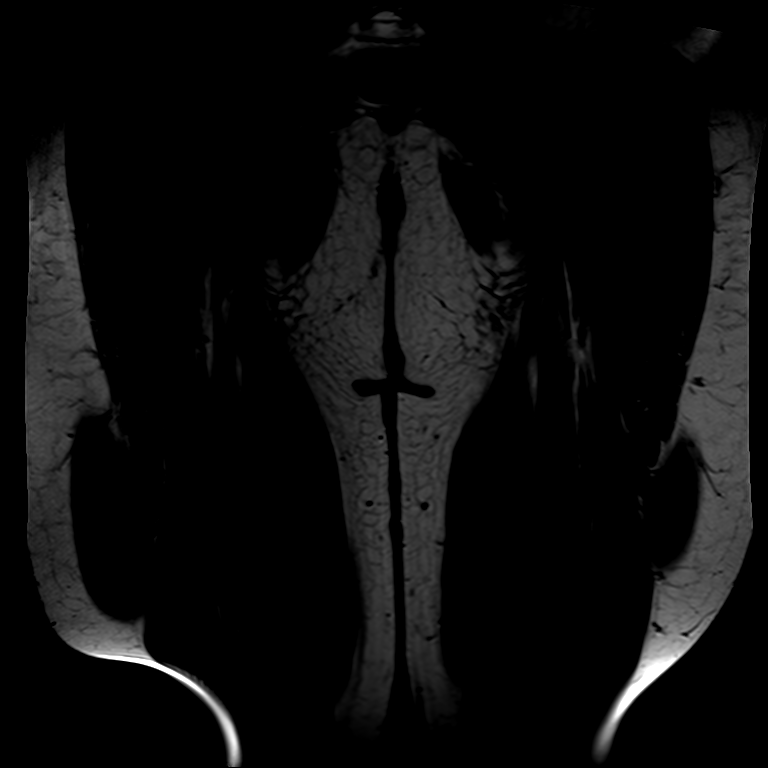
[im 16/27]
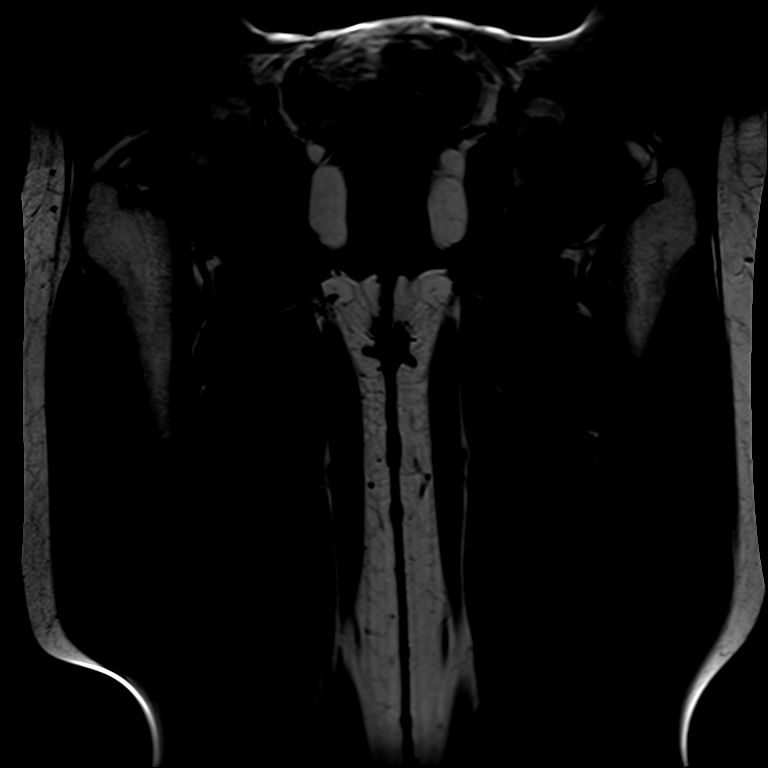
[im 27/27]
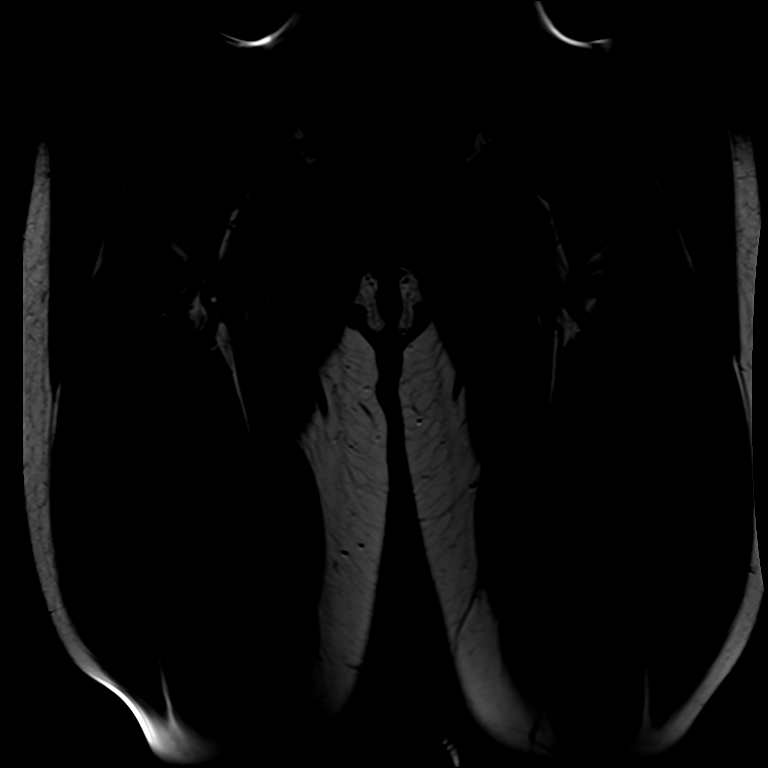

[Series 8: T1 · sagittal · 4.0mm · 0.56mm/px · 3 of 28 slices shown (3 of 3)]
[im 6/28]
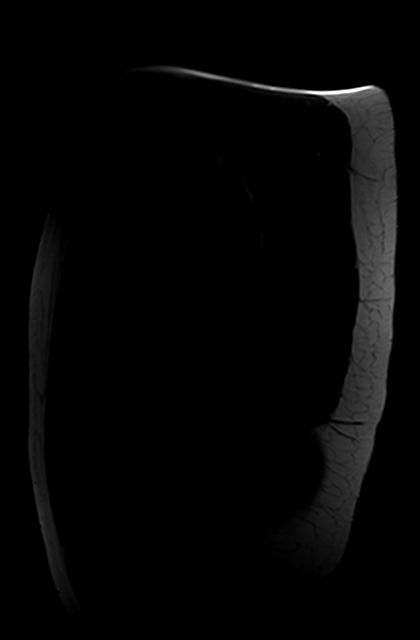
[im 17/28]
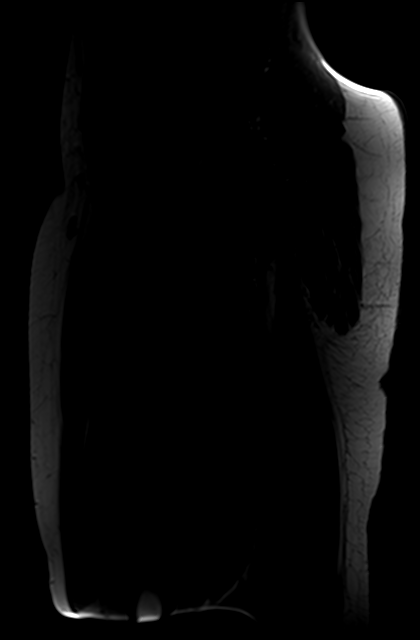
[im 28/28]
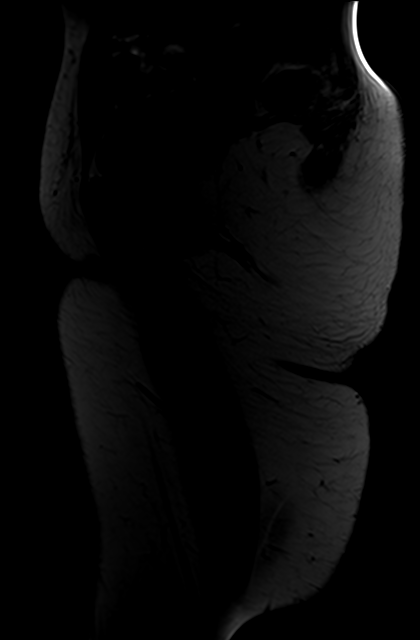

[16 of 40 positions shown; findings below may reference images not displayed]

FINDINGS: No subcutaneous lesions, edema or inflammation. The pelvic, hip and
thigh musculature are normal. No muscle edema, muscle tear, myositis
or fatty atrophy is identified.

The sciatic and femoral nerves appear normal and symmetric
bilaterally. No compressive mass, edema or surrounding fluid.

No significant intrapelvic findings are demonstrated. No inguinal
mass or adenopathy.

The hips are normally located. No stress fracture or AVN. Both
femurs are normal.
IMPRESSION: Unremarkable MR examination of the left thigh.

## 2016-09-02 ENCOUNTER — Other Ambulatory Visit: Payer: Self-pay | Admitting: Pediatrics

## 2017-01-29 ENCOUNTER — Ambulatory Visit (INDEPENDENT_AMBULATORY_CARE_PROVIDER_SITE_OTHER): Payer: BLUE CROSS/BLUE SHIELD | Admitting: Family Medicine

## 2017-01-29 DIAGNOSIS — Z020 Encounter for examination for admission to educational institution: Secondary | ICD-10-CM

## 2017-01-29 NOTE — Progress Notes (Signed)
Patient presents today for graduate physical. Patient has no acute problems at this time. She does not take any medications on a daily basis. Form was reviewed and filled out. A hemoglobin or hematocrit was required on the form. A CBC was drawn. Refer to form if needed which will be scanned into the system. Patient will return to pick up form next week.

## 2017-01-30 LAB — CBC WITH DIFFERENTIAL/PLATELET
Basophils Absolute: 0 10*3/uL (ref 0.0–0.2)
Basos: 1 %
EOS (ABSOLUTE): 0.1 10*3/uL (ref 0.0–0.4)
Eos: 2 %
Hematocrit: 39.2 % (ref 34.0–46.6)
Hemoglobin: 13.5 g/dL (ref 11.1–15.9)
Immature Grans (Abs): 0 10*3/uL (ref 0.0–0.1)
Immature Granulocytes: 0 %
Lymphocytes Absolute: 1.8 10*3/uL (ref 0.7–3.1)
Lymphs: 43 %
MCH: 33.8 pg — AB (ref 26.6–33.0)
MCHC: 34.4 g/dL (ref 31.5–35.7)
MCV: 98 fL — AB (ref 79–97)
MONOS ABS: 0.4 10*3/uL (ref 0.1–0.9)
Monocytes: 10 %
NEUTROS ABS: 1.8 10*3/uL (ref 1.4–7.0)
NEUTROS PCT: 44 %
PLATELETS: 209 10*3/uL (ref 150–379)
RBC: 4 x10E6/uL (ref 3.77–5.28)
RDW: 13.2 % (ref 12.3–15.4)
WBC: 4.1 10*3/uL (ref 3.4–10.8)

## 2017-06-11 ENCOUNTER — Ambulatory Visit: Payer: BLUE CROSS/BLUE SHIELD | Admitting: Allergy

## 2018-04-01 ENCOUNTER — Telehealth (INDEPENDENT_AMBULATORY_CARE_PROVIDER_SITE_OTHER): Payer: Self-pay | Admitting: Pediatrics

## 2018-04-01 MED ORDER — SUMATRIPTAN 20 MG/ACT NA SOLN: NASAL | 0 refills | Status: DC

## 2018-04-01 MED ORDER — TOPIRAMATE 25 MG PO TABS: ORAL_TABLET | ORAL | 0 refills | Status: DC

## 2018-04-01 NOTE — Telephone Encounter (Signed)
Father called on call provider, patient is out of medication for migraine and is currently having migraine.  Requesting "nasal medication" and "regular medication". On review of chart, patient has not been seen in over a year.  Prescription sent for current headache.  Patient must be seen in clinic for further prescriptions.   Lorenz CoasterStephanie Daniqua Campoy MD MPH

## 2018-04-04 ENCOUNTER — Telehealth (INDEPENDENT_AMBULATORY_CARE_PROVIDER_SITE_OTHER): Payer: Self-pay | Admitting: Family

## 2018-04-04 NOTE — Telephone Encounter (Signed)
Dr Artis FlockWolfe refilled medication on 04/01/18. TG

## 2018-04-04 NOTE — Telephone Encounter (Signed)
Who's calling (name and relationship to patient) : Maisie Fushomas (dad)  Best contact number:  [phone]  Provider they see: Goodpasture  Reason for call: Caller reports that his daughter is having a migraine and is out of medication.  Caller reports daughter is throwing up and cant see.   Call ID: [phone]  [facility]   PRESCRIPTION REFILL ONLY  Name of prescription:  Pharmacy:

## 2018-04-21 ENCOUNTER — Ambulatory Visit (INDEPENDENT_AMBULATORY_CARE_PROVIDER_SITE_OTHER): Payer: BLUE CROSS/BLUE SHIELD | Admitting: Family

## 2018-04-21 ENCOUNTER — Encounter (INDEPENDENT_AMBULATORY_CARE_PROVIDER_SITE_OTHER): Payer: Self-pay | Admitting: Family

## 2018-04-21 VITALS — BP 100/80 | HR 80 | Ht 67.5 in | Wt 196.2 lb

## 2018-04-21 DIAGNOSIS — G43109 Migraine with aura, not intractable, without status migrainosus: Secondary | ICD-10-CM | POA: Diagnosis not present

## 2018-04-21 DIAGNOSIS — G43009 Migraine without aura, not intractable, without status migrainosus: Secondary | ICD-10-CM | POA: Diagnosis not present

## 2018-04-21 DIAGNOSIS — G44219 Episodic tension-type headache, not intractable: Secondary | ICD-10-CM | POA: Diagnosis not present

## 2018-04-21 MED ORDER — SUMATRIPTAN 20 MG/ACT NA SOLN: NASAL | 5 refills | Status: DC

## 2018-04-21 MED ORDER — TIZANIDINE HCL 4 MG PO TABS: ORAL_TABLET | ORAL | 0 refills | Status: DC

## 2018-04-21 MED ORDER — QUDEXY XR 25 MG PO CS24: EXTENDED_RELEASE_CAPSULE | ORAL | 0 refills | Status: DC

## 2018-04-21 NOTE — Progress Notes (Signed)
Patient: Melissa Boyd MRN: [id number] Sex: female DOB: 1996/03/01  Provider: Elveria Rising, NP Location of Care: [facility]  Note type: Routine return visit  History of Present Illness: Referral Source: Verne Carrow, MD History from: patient and [facility] chart Chief Complaint: Headaches  Melissa Boyd is a 22 y.o. young woman with history of migraine and tension headaches. She was last seen February 18, 2016. She returns today because she requested refill of Sumatriptan last week and I asked her to come in for follow up. Melissa Boyd tells me today that her headaches improved after her last visit and that she was doing well until July 2019 when her headaches increased in frequency and severity. Melissa Boyd says that prior to July, she had about 1 migraine per month that was easily resolved by Sumatriptan nasal spray. In July, she had 4 severe migraines that lasted all day. With these, she has severe pain, nausea, and blurred vision. She has had one similar migraine thus far this month. Melissa Boyd has also noted that her right eyelid seems to droop at times and that her right eye orbit seems tender and painful since these migraines occurred.   Melissa Boyd used to take Topiramate for migraine prevention but stopped it on her own more than a year ago. She said that she restarted it once but suffered side effects of "feeling weird" so she stopped it again. She says that she does not skip meals, that she drinks plenty of water all day and that she gets at least 8 hours of sleep each night. She admits to some stress as she is in graduate school for occupational therapy. She is concerned about the increase in migraines as classes will begin again in 2 weeks.   Melissa Boyd has been otherwise healthy since she was last seen. She has no other health concerns today other than previously mentioned.  Review of Systems: Please see the HPI for neurologic and other pertinent review of systems. Otherwise, all other  systems were reviewed and were negative.    Past Medical History:  Diagnosis Date  . DVT (deep venous thrombosis) (HCC)   . Headache, migraine   . Urticaria    Hospitalizations: No., Head Injury: No., Nervous System Infections: No., Immunizations up to date: Yes.   Past Medical History Comments: She had sports injury of strained hamstring and hip flexor, as well as strained back muscles 2014 while exercising. She had a deep vein thrombosis and a common peroneal neuropathy and foot drop at the end of last year.  Surgical History Past Surgical History:  Procedure Laterality Date  . SHOULDER SURGERY     left  . WISDOM TOOTH EXTRACTION      Family History family history includes Alzheimer's disease in her paternal grandfather; Anemia in her father; Hypertension in her maternal aunt. Family History is otherwise negative for migraines, seizures, cognitive impairment, blindness, deafness, birth defects, chromosomal disorder, autism.  Social History Social History   Socioeconomic History  . Marital status: Single    Spouse name: Not on file  . Number of children: Not on file  . Years of education: Not on file  . Highest education level: Not on file  Occupational History  . Not on file  Social Needs  . Financial resource strain: Not on file  . Food insecurity:    Worry: Not on file    Inability: Not on file  . Transportation needs:    Medical: Not on file    Non-medical: Not on file  Tobacco Use  . Smoking status: Never Smoker  . Smokeless tobacco: Never Used  Substance and Sexual Activity  . Alcohol use: No  . Drug use: No  . Sexual activity: Yes    Birth control/protection: Condom    Comment: lo-loestrin fe   Lifestyle  . Physical activity:    Days per week: Not on file    Minutes per session: Not on file  . Stress: Not on file  Relationships  . Social connections:    Talks on phone: Not on file    Gets together: Not on file    Attends religious service: Not on  file    Active member of club or organization: Not on file    Attends meetings of clubs or organizations: Not on file    Relationship status: Not on file  Other Topics Concern  . Not on file  Social History Narrative   Melissa Boyd graduated from [employer] with a degree in [employer]. She plans on going back to school for PT. She enjoys track and field.   Melissa Boyd lives with mom and dad and some on college campus.       Allergies Allergies  Allergen Reactions  . Other Anaphylaxis    Any products that contain shellfish  . Shellfish Allergy Anaphylaxis  . Naproxen Nausea And Vomiting    Physical Exam BP 100/80   Pulse 80   Ht 5' 7.5" (1.715 m)   Wt 196 lb 3.2 oz (89 kg)   BMI 30.28 kg/m  General: Well developed, well nourished young woman, seated on exam table in no evident distress, black hair, brown eyes, right handed Head: Head normocephalic and atraumatic.  Oropharynx benign. Neck: Supple with no carotid bruits Cardiovascular: Regular rate and rhythm, no murmurs Respiratory: Breath sounds clear to auscultation Musculoskeletal: No obvious deformities or scoliosis Skin: No rashes or neurocutaneous lesions  Neurologic Exam Mental Status: Awake and fully alert.  Oriented to place and time.  Recent and remote memory intact.  Attention span, concentration, and fund of knowledge appropriate.  Mood and affect appropriate. Cranial Nerves: Fundoscopic exam reveals sharp disc margins.  Pupils equal, briskly reactive to light.  Extraocular movements full without nystagmus.  Visual fields full to confrontation.  Hearing intact and symmetric to finger rub.  Facial sensation intact.  Face tongue, palate move normally and symmetrically.  Neck flexion and extension normal. Motor: Normal bulk and tone. Normal strength in all tested extremity muscles. Sensory: Intact to touch and temperature in all extremities.  Coordination: Rapid alternating movements normal in all extremities.   Finger-to-nose and heel-to shin performed accurately bilaterally.  Romberg negative. Gait and Station: Arises from chair without difficulty.  Stance is normal. Gait demonstrates normal stride length and balance.   Able to heel, toe and tandem walk without difficulty. Reflexes: 1+ and symmetric. Toes downgoing.  Impression 1.  Migraine with aura 2.  Migraine without aura 3.  Tension headaches 4.   History of deep vein thrombosis left lower extremity 5.   History of left common peroneal neuropathy  Recommendations for plan of care The patient's previous [facility] records were reviewed. Melissa Boyd has neither had nor required imaging or lab studies since the last visit. She is a 22 year old young woman with history of tension and migraine headaches. I talked with Melissa Boyd about the recent increase in severe migraines and reminded her of typical headache triggers such as inadequate sleep, skipping meals, inadequate water intake and stress. We talked about stress management  while she is in graduate school. I recommended that she try Qudexy XR to get the migraines under control and explained that the extended release version of Topiramate is generally better tolerated. I reminded her of the need for prompt treatment of migraines and added Tizandine to her regimen for severe migraines. I asked her to keep a headache diary and to call me in a week to report on how she is doing. If she continues to have tenderness around her eye orbit, we could consider antibiotic treatment for presumed sinus infection in that area. I asked Melissa Boyd to return for follow up in 1 month or sooner if needed. Melissa Boyd agreed with the plans made today.   The medication list was reviewed and reconciled. I reviewed changes that were made in the prescribed medications today.  A complete medication list was provided to the patient.  Allergies as of 04/21/2018      Reactions   Other Anaphylaxis   Any products that contain shellfish   Shellfish  Allergy Anaphylaxis   Naproxen Nausea And Vomiting      Medication List        Accurate as of 04/21/18 11:03 PM. Always use your most recent med list.          acetaminophen 500 MG tablet Commonly known as:  TYLENOL Take 1,000 mg by mouth every 6 (six) hours as needed for moderate pain.   EPINEPHrine 0.3 mg/0.3 mL Soaj injection Commonly known as:  EPI-PEN Inject 0.3 mLs (0.3 mg total) into the muscle once.   ibuprofen 600 MG tablet Commonly known as:  ADVIL,MOTRIN Take 1 tablet (600 mg total) by mouth every 6 (six) hours as needed.   methocarbamol 750 MG tablet Commonly known as:  ROBAXIN Take 1 tablet (750 mg total) by mouth 4 (four) times daily.   multivitamin with minerals Tabs tablet Take 1 tablet by mouth daily.   ondansetron 4 MG disintegrating tablet Commonly known as:  ZOFRAN-ODT 4mg  ODT q4 hours prn nausea/vomit   OVER THE COUNTER MEDICATION Take 1 tablet by mouth daily.   QUDEXY XR Cs24 sprinkle cap Generic drug:  topiramate ER Take 1 capsule at bedtime for 1 week, then take 2 capsules at bedtime   SUMAtriptan 20 MG/ACT nasal spray Commonly known as:  IMITREX ONE SPRAY INTO NOSE AT ONSET OF MIGRAINE, AND MAY REPEAT IN 2 HOURS IF HEADACHE PERSISTS   tiZANidine 4 MG tablet Commonly known as:  ZANAFLEX Take 1 tablet at onset of pain, may repeat in 6 hours if needed       Dr. Sharene SkeansHickling was consulted regarding the patient.   Total time spent with the patient was 25 minutes, of which 50% or more was spent in counseling and coordination of care.   Elveria Risingina Shondra Capps NP-C

## 2018-04-21 NOTE — Patient Instructions (Signed)
Thank you for coming in today.   Instructions for you until your next appointment are as follows: 1. Start Qudexy XR 25mg  - 1 capsule at bedtime for 1 week, then take 2 capsules at bedtime after that 2. Call me or send a MyChart message in 1 week to let me know how you are doing. 3. When you have another migraine that is severe, take Sumatriptan nasal spray, Ibuprofen 800mg  and Tizanidine 4mg  - 1 tablet. The Tizanidine will make you sleepy so only take it if you are at home and can go to bed to sleep.  4. Keep track of your headaches and symptoms over the next month 5. Remember that headaches can be triggered by not getting enough sleep, not eat enough, not drinking enough water and stress. Hormones and the menstrual cycle can be a trigger in women.  6. Please sign up for MyChart if you have not done so 7. Please plan to return for follow up in 1 month or sooner if needed.

## 2018-04-27 ENCOUNTER — Other Ambulatory Visit (INDEPENDENT_AMBULATORY_CARE_PROVIDER_SITE_OTHER): Payer: Self-pay | Admitting: Pediatrics

## 2018-05-04 ENCOUNTER — Other Ambulatory Visit (INDEPENDENT_AMBULATORY_CARE_PROVIDER_SITE_OTHER): Payer: Self-pay | Admitting: Pediatrics

## 2018-05-23 ENCOUNTER — Encounter (INDEPENDENT_AMBULATORY_CARE_PROVIDER_SITE_OTHER): Payer: Self-pay | Admitting: Family

## 2018-05-23 ENCOUNTER — Ambulatory Visit (INDEPENDENT_AMBULATORY_CARE_PROVIDER_SITE_OTHER): Payer: BLUE CROSS/BLUE SHIELD | Admitting: Family

## 2018-05-23 VITALS — BP 100/78 | HR 68 | Ht 67.5 in | Wt 193.0 lb

## 2018-05-23 DIAGNOSIS — G44219 Episodic tension-type headache, not intractable: Secondary | ICD-10-CM

## 2018-05-23 DIAGNOSIS — G43109 Migraine with aura, not intractable, without status migrainosus: Secondary | ICD-10-CM | POA: Diagnosis not present

## 2018-05-23 DIAGNOSIS — G43009 Migraine without aura, not intractable, without status migrainosus: Secondary | ICD-10-CM

## 2018-05-23 DIAGNOSIS — G43809 Other migraine, not intractable, without status migrainosus: Secondary | ICD-10-CM

## 2018-05-23 MED ORDER — QUDEXY XR 50 MG PO CS24: EXTENDED_RELEASE_CAPSULE | ORAL | 0 refills | Status: DC

## 2018-05-23 NOTE — Patient Instructions (Signed)
Thank you for coming in today.   Instructions for you until your next appointment are as follows: 1. Continue taking Qudexy XR 50mg  - 1 capsule at bedtime 2. Continue to keep headache diaries until you return 3. If you are doing well at your next visit, we may consider reducing the dose and then ultimately stopping the medication 4. Let me know if your migraines become more frequent or more severe. 5. Remember to get at least 8 hours of sleep, to drink plenty of water each day and to avoid skipping meals 6. Also remember to work on managing stress. There may be options for counseling for you at your school if you feel overwhelmed with course work. Daily exercise helps with reducing stress as well.  7. Please sign up for MyChart if you have not done so 8. Please plan to return for follow up in 3 months or sooner if needed.

## 2018-05-23 NOTE — Progress Notes (Signed)
Patient: Melissa Boyd MRN: [id number] Sex: female DOB: 1996-03-13  Provider: Elveria Rising, NP Location of Care: [facility] Child [facility]  Note type: Routine return visit  History of Present Illness: Referral Source: Verne Carrow, MD  History from: patient and Melissa Boyd, Melissa Boyd Chief Complaint: Headaches  Melissa Boyd is a 22 y.o. young woman with history of migraine and tension headaches. She was last seen April 21, 2018.  When she was last seen, Melissa Boyd had experienced increase in migraine frequency and severity, and was started on Qudexy XR. She brought headaches today showing 13 headaches in August, 2 of which required treatment and 1 of which was an aura with no migraine pain. The remainder of the headaches were mild tension headaches. Thus far in September she has had 3 mild tension headaches and no migraines. Melissa Boyd said that she has tolerated the Qudexy XR well. She had tingling in her feet one day and after drinking water that resolved.   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 She exercises daily and drinks plenty of water each day. She has been otherwise generally healthy since she was last seen and has no other health concerns today other than previously mentioned.  Review of Systems: Please see the HPI for neurologic and other pertinent review of systems. Otherwise, all other systems were reviewed and were negative.    Past Medical History:  Diagnosis Date  . DVT (deep venous thrombosis) (HCC)   . Headache, migraine   . Urticaria    Hospitalizations: No., Head Injury: No., Nervous System Infections: No., Immunizations up to date: Yes.   Past Medical History Comments: She had sports injury of strained hamstring and hip flexor, as well as strained back muscles 2014 while exercising. She had a deep vein thrombosis and a common peroneal neuropathy and foot drop at the end of last year.   Surgical  History Past Surgical History:  Procedure Laterality Date  . SHOULDER SURGERY     left  . WISDOM TOOTH EXTRACTION      Family History family history includes Alzheimer's disease in her paternal grandfather; Anemia in her father; Hypertension in her maternal aunt. Family History is otherwise negative for migraines, seizures, cognitive impairment, blindness, deafness, birth defects, chromosomal disorder, autism.  Social History Social History   Socioeconomic History  . Marital status: Single    Spouse name: Not on file  . Number of children: Not on file  . Years of education: Not on file  . Highest education level: Not on file  Occupational History  . Not on file  Social Needs  . Financial resource strain: Not on file  . Food insecurity:    Worry: Not on file    Inability: Not on file  . Transportation needs:    Medical: Not on file    Non-medical: Not on file  Tobacco Use  . Smoking status: Never Smoker  . Smokeless tobacco: Never Used  Substance and Sexual Activity  . Alcohol use: No  . Drug use: No  . Sexual activity: Yes    Birth control/protection: Condom    Comment: lo-loestrin fe   Lifestyle  . Physical activity:    Days per week: Not on file    Minutes per session: Not on file  . Stress: Not on file  Relationships  . Social connections:    Talks on phone: Not on file    Gets together: Not  on file    Attends religious service: Not on file    Active member of club or organization: Not on file    Attends meetings of clubs or organizations: Not on file    Relationship status: Not on file  Other Topics Concern  . Not on file  Social History Narrative   Melissa Boyd graduated from [employer] with a degree in [employer]. She is back in school for OT. She enjoys track and field.   Melissa Boyd lives with Melissa Boyd and some on college campus.       Allergies Allergies  Allergen Reactions  . Other Anaphylaxis    Any products that contain shellfish  .  Shellfish Allergy Anaphylaxis  . Naproxen Nausea And Vomiting    Physical Exam BP 100/78   Pulse 68   Ht 5' 7.5" (1.715 m)   Wt 193 lb (87.5 kg)   BMI 29.78 kg/m  General: Well developed, well nourished young woman, seated on exam table, in no evident distress, black hair, brown eyes, right handed Head: Head normocephalic and atraumatic.  Oropharynx benign. Neck: Supple with no carotid bruits Cardiovascular: Regular rate and rhythm, no murmurs Respiratory: Breath sounds clear to auscultation Musculoskeletal: No obvious deformities or scoliosis Skin: No rashes or neurocutaneous lesions  Neurologic Exam Mental Status: Awake and fully alert.  Oriented to place and time.  Recent and remote memory intact.  Attention span, concentration, and fund of knowledge appropriate.  Mood and affect appropriate. Cranial Nerves: Fundoscopic exam reveals sharp disc margins.  Pupils equal, briskly reactive to light.  Extraocular movements full without nystagmus.  Visual fields full to confrontation.  Hearing intact and symmetric to finger rub.  Facial sensation intact.  Face tongue, palate move normally and symmetrically.  Neck flexion and extension normal. Motor: Normal bulk and tone. Normal strength in all tested extremity muscles. Sensory: Intact to touch and temperature in all extremities.  Coordination: Rapid alternating movements normal in all extremities.  Finger-to-nose and heel-to shin performed accurately bilaterally.  Romberg negative. Gait and Station: Arises from chair without difficulty.  Stance is normal. Gait demonstrates normal stride length and balance.   Able to heel, toe and tandem walk without difficulty. Reflexes: 1+ and symmetric. Toes downgoing.  Impression 1. Migraine without aura 2. Migraine with aura 3. Episodic tension headache 4.  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 Jahne has neither had nor required imaging or lab studies since the last visit. She is a 22 year old young woman with history of migraine and tension headaches. She has experienced improvement in migraine frequency and severity since starting Qudexy XR last month. She will continue this medication without change for now. If she is doing well at her next visit, I may reduce the dose slightly with plans for tapering the medication. I reminded Genessis of the need for her to get enough sleep, to drink plenty of water, to avoid skipping meals and to manage stress. I will see her back in follow up in 3 months or sooner if needed. Melissa Boyd agreed with the plans made today.  The medication list was reviewed and reconciled. I reviewed changes that were made in the prescribed medications today.  A complete medication list was provided to the patient.  Allergies as of 05/23/2018      Reactions   Other Anaphylaxis   Any products that contain  shellfish   Shellfish Allergy Anaphylaxis   Naproxen Nausea And Vomiting      Medication List        Accurate as of 05/23/18 12:20 PM. Always use your most recent med list.          acetaminophen 500 MG tablet Commonly known as:  TYLENOL Take 1,000 mg by mouth every 6 (six) hours as needed for moderate pain.   EPINEPHrine 0.3 mg/0.3 mL Soaj injection Commonly known as:  EPI-PEN Inject 0.3 mLs (0.3 mg total) into the muscle once.   ibuprofen 600 MG tablet Commonly known as:  ADVIL,MOTRIN Take 1 tablet (600 mg total) by mouth every 6 (six) hours as needed.   methocarbamol 750 MG tablet Commonly known as:  ROBAXIN Take 1 tablet (750 mg total) by mouth 4 (four) times daily.   multivitamin with minerals Tabs tablet Take 1 tablet by mouth daily.   ondansetron 4 MG disintegrating tablet Commonly known as:  ZOFRAN-ODT 4mg  ODT q4 hours prn nausea/vomit   OVER THE COUNTER MEDICATION Take 1 tablet by mouth daily.   QUDEXY XR 50 MG Cs24 sprinkle  capsule Generic drug:  topiramate ER Take 1 capsule at bedtime   SUMAtriptan 20 MG/ACT nasal spray Commonly known as:  IMITREX ONE SPRAY INTO NOSE AT ONSET OF MIGRAINE, AND MAY REPEAT IN 2 HOURS IF HEADACHE PERSISTS   tiZANidine 4 MG tablet Commonly known as:  ZANAFLEX Take 1 tablet at onset of pain, may repeat in 6 hours if needed       Total time spent with the patient was 20 minutes, of which 50% or more was spent in counseling and coordination of care.   Elveria Rising NP-C

## 2018-06-27 ENCOUNTER — Telehealth (INDEPENDENT_AMBULATORY_CARE_PROVIDER_SITE_OTHER): Payer: Self-pay | Admitting: Family

## 2018-06-27 NOTE — Telephone Encounter (Signed)
Pt faxed over a headache diary for the month of September. Diary was placed in Tina's box.

## 2018-06-28 NOTE — Telephone Encounter (Signed)
Headache calendar has been placed on tina's desk

## 2018-07-01 NOTE — Telephone Encounter (Signed)
I called and talked to Melissa Boyd. The September calendar had 10 tension headaches, 1 of which required treatment. The remainder of the days were headache free. She increased the Qudexy XR to 50mg  on October 14th and has tolerated that so far. She said that she had 1 day of migraine thus far in October on a day that she was stressed with work. I asked her to continue to take the medication, to keep the headache diaries and to send the October diary in next month. I reminded Melissa Boyd of the need to drink plenty of water each day. She agreed with this plan. TG

## 2018-08-24 ENCOUNTER — Ambulatory Visit (INDEPENDENT_AMBULATORY_CARE_PROVIDER_SITE_OTHER): Payer: BLUE CROSS/BLUE SHIELD | Admitting: Family

## 2018-08-24 ENCOUNTER — Encounter (INDEPENDENT_AMBULATORY_CARE_PROVIDER_SITE_OTHER): Payer: Self-pay | Admitting: Family

## 2018-08-24 VITALS — BP 118/70 | HR 72 | Ht 67.5 in | Wt 182.8 lb

## 2018-08-24 DIAGNOSIS — G43109 Migraine with aura, not intractable, without status migrainosus: Secondary | ICD-10-CM | POA: Diagnosis not present

## 2018-08-24 DIAGNOSIS — G44219 Episodic tension-type headache, not intractable: Secondary | ICD-10-CM

## 2018-08-24 DIAGNOSIS — G43009 Migraine without aura, not intractable, without status migrainosus: Secondary | ICD-10-CM | POA: Diagnosis not present

## 2018-08-24 NOTE — Progress Notes (Signed)
Patient: Melissa Boyd MRN: [id number] Sex: female DOB: 09-15-1995  Provider: Elveria Rising, NP Location of Care: [facility] Child Neurology  Note type: Routine return visit  History of Present Illness: Referral Source: Verne Carrow, MD History from: patient and [facility] chart Chief Complaint: Headaches  Melissa Boyd is a 22 y.o. young woman with history of migraine and tension headaches. She was last seen May 23, 2018. She is taking and tolerating Qudexy XR and has had reduction in migraine frequency and severity. She brought in headache diaries today showing 7 tension headaches, 1 of which required treatment, 1 severe migraine and the remainder headache free in October. In November, she had 9 tension headaches, 1 of which required treatment, no migraines and the remainder of the days were headache free. Thus far in December she has had 4 tension headaches, 1 of which required treatment, 1 day of migraine and the remaining days headache free. On the migraine day this month, she had blurry vision but no migraine pain. The blurry vision was significant and she had to lie down for most of the day.   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 She works to stay on a regular sleep schedule, exercises regularly, does not skip meals and drinks plenty of water each day. She has been generally healthy since she was last seen and has no other health concerns today other than previously mentioned.    Review of Systems: Please see the HPI for neurologic and other pertinent review of systems. Otherwise, all other systems were reviewed and were negative.    Past Medical History:  Diagnosis Date  . DVT (deep venous thrombosis) (HCC)   . Headache, migraine   . Urticaria    Hospitalizations: No., Head Injury: No., Nervous System Infections: No., Immunizations up to date: Yes.   Past Medical History Comments: See HPI Copied from previous  record:  She had sports injury of strained hamstring and hip flexor, as well as strained back muscles 2014 while exercising. She had a deep vein thrombosis and a common peroneal neuropathy and foot drop at the end of last year.  Surgical History Past Surgical History:  Procedure Laterality Date  . SHOULDER SURGERY     left  . WISDOM TOOTH EXTRACTION      Family History family history includes Alzheimer's disease in her paternal grandfather; Anemia in her father; Hypertension in her maternal aunt. Family History is otherwise negative for migraines, seizures, cognitive impairment, blindness, deafness, birth defects, chromosomal disorder, autism.  Social History Social History   Socioeconomic History  . Marital status: Single    Spouse name: Not on file  . Number of children: Not on file  . Years of education: Not on file  . Highest education level: Not on file  Occupational History  . Not on file  Social Needs  . Financial resource strain: Not on file  . Food insecurity:    Worry: Not on file    Inability: Not on file  . Transportation needs:    Medical: Not on file    Non-medical: Not on file  Tobacco Use  . Smoking status: Never Smoker  . Smokeless tobacco: Never Used  Substance and Sexual Activity  . Alcohol use: No  . Drug use: No  . Sexual activity: Yes    Birth control/protection: Condom    Comment: lo-loestrin fe   Lifestyle  . Physical activity:    Days per week: Not  on file    Minutes per session: Not on file  . Stress: Not on file  Relationships  . Social connections:    Talks on phone: Not on file    Gets together: Not on file    Attends religious service: Not on file    Active member of club or organization: Not on file    Attends meetings of clubs or organizations: Not on file    Relationship status: Not on file  Other Topics Concern  . Not on file  Social History Narrative   Melissa Boyd graduated from [employer] with a degree in [employer]. She is back in school for OT. She enjoys track and field.   Melissa Boyd lives with mom and dad and some on college campus.       Allergies Allergies  Allergen Reactions  . Other Anaphylaxis    Any products that contain shellfish  . Shellfish Allergy Anaphylaxis  . Naproxen Nausea And Vomiting    Physical Exam BP 118/70   Pulse 72   Ht 5' 7.5" (1.715 m)   Wt 182 lb 12.8 oz (82.9 kg)   BMI 28.21 kg/m  General: Well developed, well nourished young woman, seated on exam table, in no evident distress, black hair, brown eyes, right handed Head: Head normocephalic and atraumatic.  Oropharynx benign. Neck: Supple with no carotid bruits Cardiovascular: Regular rate and rhythm, no murmurs Respiratory: Breath sounds clear to auscultation Musculoskeletal: No obvious deformities or scoliosis Skin: No rashes or neurocutaneous lesions  Neurologic Exam Mental Status: Awake and fully alert.  Oriented to place and time.  Recent and remote memory intact.  Attention span, concentration, and fund of knowledge appropriate.  Mood and affect appropriate. Cranial Nerves: Fundoscopic exam reveals sharp disc margins.  Pupils equal, briskly reactive to light.  Extraocular movements full without nystagmus.  Visual fields full to confrontation.  Hearing intact and symmetric to finger rub.  Facial sensation intact.  Face tongue, palate move normally and symmetrically.  Neck flexion and extension normal. Motor: Normal bulk and tone. Normal strength in all tested extremity muscles. Sensory: Intact to touch and temperature in all extremities.  Coordination: Rapid alternating movements normal in all extremities.  Finger-to-nose and heel-to shin performed accurately bilaterally.  Romberg negative. Gait and Station: Arises from chair without difficulty.  Stance is normal. Gait demonstrates normal stride length and balance.   Able to heel, toe and tandem walk without difficulty. Reflexes: 1+ and symmetric. Toes  downgoing.  Impression 1.  Migraine without aura 2.  Migraine with aura 3.  Episodic tension headache 4.  Hx of DVT left lower extremity 5.  Hx of left common peroneal neuropathy  Recommendations for plan of care The patient's previous [facility] records were reviewed. Melissa Boyd has neither had nor required imaging or lab studies since the last visit. She is a 22 year old young woman with tension and migraine headaches. She is taking and tolerating Qudexy XR for migraine prevention and has experienced a significant decrease in migraine frequency and severity. She will continue the medication without change for now as she is a busy Gaffer. We may consider tapering and discontinuing the medication when she is out of school next summer. I reminded her to continue to drink plenty of water, avoid skipping meals, get at least 8 hours of sleep each night and to manage stress. I will see Melissa Boyd back in follow up in 6 months or sooner if needed. She agreed with the plans made today.  The medication list was reviewed and reconciled.  No changes were made in the prescribed medications today.  A complete medication list was provided to the patient.  Allergies as of 08/24/2018      Reactions   Other Anaphylaxis   Any products that contain shellfish   Shellfish Allergy Anaphylaxis   Naproxen Nausea And Vomiting      Medication List        Accurate as of 08/24/18 10:35 AM. Always use your most recent med list.          acetaminophen 500 MG tablet Commonly known as:  TYLENOL Take 1,000 mg by mouth every 6 (six) hours as needed for moderate pain.   EPINEPHrine 0.3 mg/0.3 mL Soaj injection Commonly known as:  EPI-PEN Inject 0.3 mLs (0.3 mg total) into the muscle once.   ibuprofen 600 MG tablet Commonly known as:  ADVIL,MOTRIN Take 1 tablet (600 mg total) by mouth every 6 (six) hours as needed.   methocarbamol 750 MG tablet Commonly known as:  ROBAXIN Take 1 tablet (750 mg total) by  mouth 4 (four) times daily.   multivitamin with minerals Tabs tablet Take 1 tablet by mouth daily.   ondansetron 4 MG disintegrating tablet Commonly known as:  ZOFRAN-ODT 4mg  ODT q4 hours prn nausea/vomit   OVER THE COUNTER MEDICATION Take 1 tablet by mouth daily.   QUDEXY XR 50 MG Cs24 sprinkle capsule Generic drug:  topiramate ER Take 1 capsule at bedtime   SUMAtriptan 20 MG/ACT nasal spray Commonly known as:  IMITREX ONE SPRAY INTO NOSE AT ONSET OF MIGRAINE, AND MAY REPEAT IN 2 HOURS IF HEADACHE PERSISTS   tiZANidine 4 MG tablet Commonly known as:  ZANAFLEX Take 1 tablet at onset of pain, may repeat in 6 hours if needed       Total time spent with the patient was 20 minutes, of which 50% or more was spent in counseling and coordination of care.   Elveria Risingina Payten Hobin NP-C

## 2018-08-25 ENCOUNTER — Encounter (INDEPENDENT_AMBULATORY_CARE_PROVIDER_SITE_OTHER): Payer: Self-pay | Admitting: Family

## 2018-08-25 NOTE — Patient Instructions (Signed)
Thank you for coming in today.   Instructions for you until your next appointment are as follows: 1. Continue taking Qudexy XR as you have been taking it. If your migraines continue to be infrequent, we may consider tapering the medication at your next visit. 2. Remember to drink plenty of water, to avoid skipping meals, to get at least 8 hours of sleep and to manage stress to help reduce headache frequency.  3. Continue to keep headache diaries and to send them in monthly. 4. Please sign up for MyChart if you have not done so 5.  Please plan to return for follow up in 6 months or sooner if needed.

## 2018-09-22 ENCOUNTER — Telehealth (INDEPENDENT_AMBULATORY_CARE_PROVIDER_SITE_OTHER): Payer: Self-pay | Admitting: Family

## 2018-09-22 NOTE — Telephone Encounter (Signed)
Melissa Boyd sent in a headache diary for December showing 7 tension headaches, 1 of which required treatment. She had 1 migraine. I called and talked with her, and recommended that she continue the current treatment plan. I asked her to continue to send in headache diaries and she agreed. TG

## 2018-11-28 DIAGNOSIS — L858 Other specified epidermal thickening: Secondary | ICD-10-CM | POA: Diagnosis not present

## 2018-11-28 DIAGNOSIS — L219 Seborrheic dermatitis, unspecified: Secondary | ICD-10-CM | POA: Diagnosis not present

## 2018-11-28 DIAGNOSIS — L700 Acne vulgaris: Secondary | ICD-10-CM | POA: Diagnosis not present

## 2019-01-19 ENCOUNTER — Other Ambulatory Visit (INDEPENDENT_AMBULATORY_CARE_PROVIDER_SITE_OTHER): Payer: Self-pay | Admitting: Family

## 2019-01-19 DIAGNOSIS — G43109 Migraine with aura, not intractable, without status migrainosus: Secondary | ICD-10-CM

## 2019-01-19 DIAGNOSIS — G43009 Migraine without aura, not intractable, without status migrainosus: Secondary | ICD-10-CM

## 2019-01-19 DIAGNOSIS — G43809 Other migraine, not intractable, without status migrainosus: Secondary | ICD-10-CM

## 2019-01-19 MED ORDER — QUDEXY XR 50 MG PO CS24: EXTENDED_RELEASE_CAPSULE | ORAL | 3 refills | Status: DC

## 2019-01-19 NOTE — Telephone Encounter (Signed)
Please send to the pharmacy °

## 2019-01-31 DIAGNOSIS — Z6827 Body mass index (BMI) 27.0-27.9, adult: Secondary | ICD-10-CM | POA: Diagnosis not present

## 2019-01-31 DIAGNOSIS — Z0000 Encounter for general adult medical examination without abnormal findings: Secondary | ICD-10-CM | POA: Diagnosis not present

## 2019-02-01 ENCOUNTER — Ambulatory Visit (INDEPENDENT_AMBULATORY_CARE_PROVIDER_SITE_OTHER): Payer: BLUE CROSS/BLUE SHIELD | Admitting: Family

## 2019-02-01 ENCOUNTER — Encounter (INDEPENDENT_AMBULATORY_CARE_PROVIDER_SITE_OTHER): Payer: Self-pay | Admitting: Family

## 2019-02-01 ENCOUNTER — Other Ambulatory Visit: Payer: Self-pay

## 2019-02-01 DIAGNOSIS — G43109 Migraine with aura, not intractable, without status migrainosus: Secondary | ICD-10-CM | POA: Diagnosis not present

## 2019-02-01 DIAGNOSIS — G43009 Migraine without aura, not intractable, without status migrainosus: Secondary | ICD-10-CM

## 2019-02-01 DIAGNOSIS — G43809 Other migraine, not intractable, without status migrainosus: Secondary | ICD-10-CM

## 2019-02-01 MED ORDER — SUMATRIPTAN 20 MG/ACT NA SOLN: NASAL | 5 refills | Status: DC

## 2019-02-01 MED ORDER — QUDEXY XR 50 MG PO CS24: EXTENDED_RELEASE_CAPSULE | ORAL | 5 refills | Status: DC

## 2019-02-01 NOTE — Progress Notes (Signed)
This is a Pediatric Specialist E-Visit follow up consult provided via WebEx Melissa Boyd consented to an E-Visit consult today.  Location of patient: Melissa Boyd is at home Location of provider: Elveria Rising, NP is in office Patient was referred by Sigmund Hazel, MD   The following participants were involved in this E-Visit: patient, CMA, provider  Chief Complain/ Reason for E-Visit today: Headaches Total time on call: 15 min Follow up: 6 weeks     Melissa Boyd   MRN:  [id number]  Jul 09, 1996   Provider: Elveria Rising NP-C Location of Care: [facility]  Visit type: routine visit  Last visit: 08/24/2018  Referral source: Verne Carrow, MD History from: patient and chcn chart  Brief history:  History of migraine and tension headaches. She is taking and tolerating Qudexy XR  for migraine prevention. She keeps headache diaries and send them in monthly. Melissa Boyd has occasional tension headaches that do not require treatment. With the migraines, she has onset of blurry vision, then holocephalic pain and nausea.  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 She sent in the April headache diary for review at this visit and in April she had 4 tension headaches, 2 of which required treatment and 2 migraine headaches, both of which began with blurry vision. If she takes Sumatriptan nasal spray and/or Tylenol and rests when the visual disturbance occurs, she can usually get relief within about 30 minutes. If she is unable to take medication the migraine will progress and she will be forced to sleep in order to obtain relief. She has noted that stress and not being well hydrated can trigger migraines. She is starting a clinical rotation for her occupational therapy program in June and is concerned about having  migraines that may cause her to miss time.   Melissa Boyd works to keep a regular schedule, eats regular meals, drinks enough water most days and gets at least 8 hours of sleep at night. She tries to manage stress but admits that with a busy schedule or if she is rushed that sometimes she develops a headache. She has been exercising by running since gyms have been closed due to Covid 19 pandemic.   Melissa Boyd has been otherwise generally healthy since she was last seen. She has no other health concerns today other than previously mentioned.  Review of systems: Please see HPI for neurologic and other pertinent review of systems. Otherwise all other systems were reviewed and were negative.  Problem List: Patient Active Problem List   Diagnosis Date Noted  . Palpitations 09/10/2015  . Idiopathic urticaria 07/01/2015  . Food allergy 07/01/2015  . Allergic rhinitis 06/28/2015  . Subjective visual disturbance of left eye 05/29/2015  . Neck pain on left side 05/27/2015  . History of deep vein thrombosis of lower extremity 11/05/2014  . 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 Awareness of heartbeats 08/07/2014  . Paroxysmal supraventricular tachycardia (HCC) 08/07/2014  . Breathlessness on exertion 08/07/2014  . Migraine with aura 12/08/2012  . Migraine variant 12/08/2012  . Migraine without aura 12/08/2012  . Episodic tension type headache 12/08/2012  . Ovarian cyst 09/01/2012  . Migraines 04/18/2012  . Headache, migraine 04/18/2012     Past Medical History:  Diagnosis Date  . DVT (deep venous thrombosis) (HCC)   . Headache, migraine   .  Urticaria     Past medical history comments: See HPI Copied from previous record: She had sports injury of strained hamstring and hip flexor, as well as strained back muscles 2014 while exercising. She had a deep vein thrombosis and a common peroneal neuropathy and foot drop in 2016.  Surgical history: Past Surgical History:   Procedure Laterality Date  . SHOULDER SURGERY     left  . WISDOM TOOTH EXTRACTION       Family history: family history includes Alzheimer's disease in her paternal grandfather; Anemia in her father; Hypertension in her maternal aunt.   Social history: Social History   Socioeconomic History  . Marital status: Single    Spouse name: Not on file  . Number of children: Not on file  . Years of education: Not on file  . Highest education level: Not on file  Occupational History  . Not on file  Social Needs  . Financial resource strain: Not on file  . Food insecurity:    Worry: Not on file    Inability: Not on file  . Transportation needs:    Medical: Not on file    Non-medical: Not on file  Tobacco Use  . Smoking status: Never Smoker  . Smokeless tobacco: Never Used  Substance and Sexual Activity  . Alcohol use: No  . Drug use: No  . Sexual activity: Yes    Birth control/protection: Condom    Comment: lo-loestrin fe   Lifestyle  . Physical activity:    Days per week: Not on file    Minutes per session: Not on file  . Stress: Not on file  Relationships  . Social connections:    Talks on phone: Not on file    Gets together: Not on file    Attends religious service: Not on file    Active member of club or organization: Not on file    Attends meetings of clubs or organizations: Not on file    Relationship status: Not on file  . Intimate partner violence:    Fear of current or ex partner: Not on file    Emotionally abused: Not on file    Physically abused: Not on file    Forced sexual activity: Not on file  Other Topics Concern  . Not on file  Social History Narrative   Melissa Boyd graduated from [employer] with a degree in [employer] Science. She is back in school for OT. She enjoys track and field.   Melissa Boyd lives with mom and dad and some on college campus.        Allergies: Allergies  Allergen Reactions  . Other Anaphylaxis    Any products that contain  shellfish  . Shellfish Allergy Anaphylaxis  . Naproxen Nausea And Vomiting      Immunizations:  There is no immunization history on file for this patient.    Diagnostics/Screenings: 12/31/2014 - MRI brain and cervical spine w/wo contrast - normal appearance of brain and cervical spine   Physical Exam: There were no vitals taken for this visit.  General: Well developed, well nourished young woman, seated, in no evident distress, black hair, brown eyes, right handed Head: Head normocephalic and atraumatic. Neck: Supple Musculoskeletal: No obvious deformities or scoliosis Skin: No rashes or neurocutaneous lesions  Neurologic Exam Mental Status: Awake and fully alert.  Oriented to place and time.  Recent and remote memory intact.  Attention span, concentration, and fund of knowledge appropriate.  Mood and affect appropriate. Cranial Nerves: Extraocular  movements full without nystagmus. Hearing intact and symmetric bilaterally.  Facial sensation intact.  Face tongue, palate move normally and symmetrically.  Neck flexion and extension normal. Motor: Normal functional bulk, tone and strength Sensory: Intact to touch and temperature in all extremities.  Coordination: Rapid alternating movements normal in all extremities.  Finger-to-nose and heel-to shin performed accurately bilaterally.  Gait and Station: Arises from chair without difficulty.  Stance is normal. Gait demonstrates normal stride length and balance.    Impression: 1. Migraine without aura 2. Migraine with aura 3. Episodic tension headache 4. Hx DVT left lower extremitiy 5. Hx left common peroneal neuropathy  Recommendations for plan of care: The patient's previous [facility] records were reviewed. Tiney has neither had nor required imaging or lab studies since the last visit. She is a 23 year old young woman with history of tension and migraine headaches. She is taking and tolerating Qudexy XR 50mg  for migraine prevention and  I recommended increasing that today to 100mg  per day because she continues to have migraines, and will be entering a clinical rotation for her occupational therapy program in June. She is concerned about having migraines that would cause her to miss time from the rotation. I talked with her about that and instructed her to be sure to always carry her abortive medications of Sumatriptan, Tylenol or Advil and Ondansetron with her to school. I reminded her of the need to continue to eat regular meals, to drink at least 60 oz of water per day and to get at least 8-9 hours of sleep each night. I will write a letter explaining her condition to her school and mail that to her. I asked her to continue to keep headache diaries and to send them in monthly. I will see her back in follow up in about 6 weeks to see how she is doing on the increased dose or sooner if needed. Mehnaz agreed with the plans made today.   The medication list was reviewed and reconciled. I reviewed changes that were made in the prescribed medications today. A complete medication list was provided to the patient.  Allergies as of 02/01/2019      Reactions   Other Anaphylaxis   Any products that contain shellfish   Shellfish Allergy Anaphylaxis   Naproxen Nausea And Vomiting      Medication List       Accurate as of Feb 01, 2019 11:59 PM. If you have any questions, ask your nurse or doctor.        acetaminophen 500 MG tablet Commonly known as:  TYLENOL Take 1,000 mg by mouth every 6 (six) hours as needed for moderate pain.   EPINEPHrine 0.3 mg/0.3 mL Soaj injection Commonly known as:  EpiPen 2-Pak Inject 0.3 mLs (0.3 mg total) into the muscle once.   ibuprofen 600 MG tablet Commonly known as:  ADVIL Take 1 tablet (600 mg total) by mouth every 6 (six) hours as needed.   methocarbamol 750 MG tablet Commonly known as:  Robaxin-750 Take 1 tablet (750 mg total) by mouth 4 (four) times daily.   multivitamin with minerals Tabs  tablet Take 1 tablet by mouth daily.   ondansetron 4 MG disintegrating tablet Commonly known as:  Zofran ODT 4mg  ODT q4 hours prn nausea/vomit   OVER THE COUNTER MEDICATION Take 1 tablet by mouth daily.   Qudexy XR 50 MG Cs24 sprinkle capsule Generic drug:  topiramate ER Take 1 capsule twice per the day What changed:  additional instructions  Changed by:  Elveria Rising, NP   SUMAtriptan 20 MG/ACT nasal spray Commonly known as:  IMITREX ONE SPRAY INTO NOSE AT ONSET OF MIGRAINE, AND MAY REPEAT IN 2 HOURS IF HEADACHE PERSISTS   tiZANidine 4 MG tablet Commonly known as:  ZANAFLEX Take 1 tablet at onset of pain, may repeat in 6 hours if needed       Total time spent on the Webex with the patient was 15 minutes, of which 50% or more was spent in counseling and coordination of care.   Elveria Rising NP-C [facility] Ph. [phone] Fax [phone]

## 2019-02-01 NOTE — Patient Instructions (Signed)
Thank you for meeting with me by Webex today.   Instructions for you until your next appointment are as follows: 1. Increase the Qudexy XR 50mg  to 2 capsules per day. This is the migraine preventative medication.  2. Remember to drink plenty of water each day - at least 60 oz or more on days when you exercise or are exposed to hot temperatures 3. I will mail a letter to you for your clinical experience regarding your migraines.  4. I will mail you more headache diaries. Please continue to send them in monthly.  5. Please plan to do another Webex meeting in about 6 weeks (early July) to let me know how you are doing.

## 2019-02-02 ENCOUNTER — Encounter (INDEPENDENT_AMBULATORY_CARE_PROVIDER_SITE_OTHER): Payer: Self-pay | Admitting: Family

## 2019-02-16 ENCOUNTER — Encounter (INDEPENDENT_AMBULATORY_CARE_PROVIDER_SITE_OTHER): Payer: Self-pay

## 2019-02-16 ENCOUNTER — Telehealth (INDEPENDENT_AMBULATORY_CARE_PROVIDER_SITE_OTHER): Payer: Self-pay | Admitting: Family

## 2019-02-16 NOTE — Telephone Encounter (Signed)
Melissa Boyd sent in her May headache diary which showed 12 tension headaches, 2 of which required treatment, 2 migraines and 17 days headache free. I sent her a MyChart message regarding the diary. TG

## 2019-03-02 DIAGNOSIS — L700 Acne vulgaris: Secondary | ICD-10-CM | POA: Diagnosis not present

## 2019-03-13 DIAGNOSIS — R300 Dysuria: Secondary | ICD-10-CM | POA: Diagnosis not present

## 2019-03-13 DIAGNOSIS — N390 Urinary tract infection, site not specified: Secondary | ICD-10-CM | POA: Diagnosis not present

## 2019-03-13 DIAGNOSIS — R109 Unspecified abdominal pain: Secondary | ICD-10-CM | POA: Diagnosis not present

## 2019-03-20 ENCOUNTER — Other Ambulatory Visit: Payer: Self-pay | Admitting: Physician Assistant

## 2019-03-20 ENCOUNTER — Ambulatory Visit
Admission: RE | Admit: 2019-03-20 | Discharge: 2019-03-20 | Disposition: A | Discharge status: Home / Self Care | Payer: BLUE CROSS/BLUE SHIELD | Source: Ambulatory Visit | Attending: Physician Assistant | Admitting: Physician Assistant

## 2019-03-20 DIAGNOSIS — R1013 Epigastric pain: Secondary | ICD-10-CM | POA: Diagnosis not present

## 2019-03-20 DIAGNOSIS — R1012 Left upper quadrant pain: Secondary | ICD-10-CM | POA: Diagnosis not present

## 2019-03-20 DIAGNOSIS — R0781 Pleurodynia: Secondary | ICD-10-CM | POA: Diagnosis not present

## 2019-03-20 DIAGNOSIS — R319 Hematuria, unspecified: Secondary | ICD-10-CM | POA: Diagnosis not present

## 2019-03-20 DIAGNOSIS — N3001 Acute cystitis with hematuria: Secondary | ICD-10-CM | POA: Diagnosis not present

## 2019-03-28 ENCOUNTER — Encounter (INDEPENDENT_AMBULATORY_CARE_PROVIDER_SITE_OTHER): Payer: Self-pay | Admitting: Family

## 2019-03-28 ENCOUNTER — Ambulatory Visit (INDEPENDENT_AMBULATORY_CARE_PROVIDER_SITE_OTHER): Payer: BLUE CROSS/BLUE SHIELD | Admitting: Family

## 2019-03-28 ENCOUNTER — Other Ambulatory Visit: Payer: Self-pay

## 2019-03-28 DIAGNOSIS — G43109 Migraine with aura, not intractable, without status migrainosus: Secondary | ICD-10-CM

## 2019-03-28 DIAGNOSIS — G43809 Other migraine, not intractable, without status migrainosus: Secondary | ICD-10-CM

## 2019-03-28 DIAGNOSIS — G44219 Episodic tension-type headache, not intractable: Secondary | ICD-10-CM | POA: Diagnosis not present

## 2019-03-28 DIAGNOSIS — G43009 Migraine without aura, not intractable, without status migrainosus: Secondary | ICD-10-CM | POA: Diagnosis not present

## 2019-03-28 NOTE — Progress Notes (Signed)
This is a Pediatric Specialist E-Visit follow up consult provided via WebEx Melissa Boyd consented to an E-Visit consult today.  Location of patient: Melissa Boyd is at school (location) Location of provider: Rockwell Germany, FNP is at office (location) Patient was referred by Melissa Lass, MD   The following participants were involved in this E-Visit: Melissa Boyd, CMA             Melissa Germany, FNP Chief Complain/ Reason for E-Visit today: headache follow up Total time on call: 15 min Follow up: 3 months     Melissa Boyd   MRN:  [id number]  November 26, 1995   Provider: Rockwell Germany NP-C Location of Care: [facility]  Visit type: Routine Follow-Up  Last visit: 02/01/2019  Referral source: Melissa Amber, MD History from: patient and [facility] chart  Brief history:  History of migraine and tension headaches. She is taking and tolerating Qudexy XR 50mg  for migraine prevention. She keeps headache diaries and send them in monthly. Melissa Boyd has occasional tension headaches that do not require treatment. With the migraines, she has onset of blurry vision, then holocephalic pain and nausea.   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 She had one day in which her eyes felt weird and she felt that a migraine was going to occur but she rested briefly and the symptom passed. The remaining days were headache free. The May headache diary also had a similar number of tension headaches and 2 migraines. 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 She says that she occasionally misses meals but tries to drink plenty of water each day. She generally gets at least 8 hours of sleep. She gets exercise by running each day.    Melissa Boyd has been otherwise generally healthy since she was last seen. She has no other health concerns today other than previously mentioned.   Review of systems: Please see HPI for neurologic and other pertinent review of systems. Otherwise all other systems were reviewed and were negative.  Problem List: Patient Active Problem List   Diagnosis Date Noted  . Palpitations 09/10/2015  . Idiopathic urticaria 07/01/2015  . Food allergy 07/01/2015  . Allergic rhinitis 06/28/2015  . Subjective visual disturbance of left eye 05/29/2015  . Neck pain on left side 05/27/2015  . History of deep vein thrombosis of lower extremity 11/05/2014  . 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 Awareness of heartbeats 08/07/2014  . Paroxysmal supraventricular tachycardia ([place]) 08/07/2014  . Breathlessness on exertion 08/07/2014  . Migraine with aura 12/08/2012  . Migraine variant 12/08/2012  . Migraine without aura 12/08/2012  . Episodic tension type headache 12/08/2012  . Ovarian cyst 09/01/2012  . Migraines 04/18/2012  . Headache, migraine 04/18/2012     Past Medical History:  Diagnosis Date  . DVT (deep venous thrombosis) ([place])   . Headache, migraine   . Urticaria     Past medical history comments: See HPI Copied from previous record: She had sports injury of strained hamstring and hip flexor, as well as strained back muscles 2014 while exercising. She had a deep vein thrombosis and a common peroneal neuropathy and foot drop in 2016.  Surgical history: Past Surgical History:  Procedure Laterality Date  .  SHOULDER SURGERY     left  . WISDOM TOOTH EXTRACTION       Family history: family history includes Alzheimer's disease in her paternal grandfather; Anemia in her father; Hypertension in her maternal aunt.   Social history: Social History   Socioeconomic History  . Marital status: Single    Spouse name: Not on file  . Number of children: Not on file  .  Years of education: Not on file  . Highest education level: Not on file  Occupational History  . Not on file  Social Needs  . Financial resource strain: Not on file  . Food insecurity    Worry: Not on file    Inability: Not on file  . Transportation needs    Medical: Not on file    Non-medical: Not on file  Tobacco Use  . Smoking status: Never Smoker  . Smokeless tobacco: Never Used  Substance and Sexual Activity  . Alcohol use: No  . Drug use: No  . Sexual activity: Yes    Birth control/protection: Condom    Comment: lo-loestrin fe   Lifestyle  . Physical activity    Days per week: Not on file    Minutes per session: Not on file  . Stress: Not on file  Relationships  . Social Musicianconnections    Talks on phone: Not on file    Gets together: Not on file    Attends religious service: Not on file    Active member of club or organization: Not on file    Attends meetings of clubs or organizations: Not on file    Relationship status: Not on file  . Intimate partner violence    Fear of current or ex partner: Not on file    Emotionally abused: Not on file    Physically abused: Not on file    Forced sexual activity: Not on file  Other Topics Concern  . Not on file  Social History Narrative   Melissa Boyd graduated from [employer] with a degree in [employer] Science. She is back in school for OT. She enjoys track and field.   Melissa Boyd lives with mom and dad and some on college campus.        Past/failed meds: Naproxen - nausea and vomiting Have not tried beta blockers as she is an athlete  Allergies: Allergies  Allergen Reactions  . Other Anaphylaxis    Any products that contain shellfish  . Shellfish Allergy Anaphylaxis  . Naproxen Nausea And Vomiting      Immunizations:  There is no immunization history on file for this patient.    Diagnostics/Screenings: 12/31/14 - MRI brain and cervical spine w/wo contrast - normal appearance of brain and cervical spine    Physical Exam: There were no vitals taken for this visit. General: Well developed, well nourished young woman, seated, in no evident distress, black hair, brown eyes, right handed Head: Head normocephalic and atraumatic. Neck: Supple Musculoskeletal: No obvious deformities or scoliosis Skin: No rashes or neurocutaneous lesions  Neurologic Exam Mental Status: Awake and fully alert.  Oriented to place and time.  Recent and remote memory intact.  Attention span, concentration, and fund of knowledge appropriate.  Mood and affect appropriate. Cranial Nerves: Extraocular movements full without nystagmus. Hearing intact and symmetric.  Facial sensation intact.  Face tongue, palate move normally and symmetrically.  Neck flexion and extension normal. Motor: Normal functional bulk, tone and strength Sensory: Intact to touch and temperature in all extremities.  Coordination: Rapid  alternating movements normal in all extremities.  Finger-to-nose and heel-to shin performed accurately bilaterally.  Gait and Station: Arises from chair without difficulty.  Stance is normal. Gait demonstrates normal stride length and balance.   Impression: 1. Migraine without aura 2. Migraine with aura 3. Episodic tension headache 4. Hx DVT left lower extremity 5. Hx left common peroneal neuropathy  Recommendations for plan of care: [facility] Melissa Boyd has neither had nor required imaging or lab studies since the last visit. She is a 23 year old woman with history of migraine and tension headaches. She is taking and tolerating Qudexy XR for migraine prevention and has had improvement in migraine frequency. She is having a considerable number of tension headaches and we talked about ways to potentially reduce the number of these headaches by avoiding skipping meals, drinking plenty of water, getting enough sleep and managing stress. She agreed to work on these things. I asked her to  continue to send in headache diaries each month. I will see her back in follow up in 3 months or sooner if needed.   The medication list was reviewed and reconciled. No changes were made in the prescribed medications today. A complete medication list was provided to the patient.  Allergies as of 03/28/2019      Reactions   Other Anaphylaxis   Any products that contain shellfish   Shellfish Allergy Anaphylaxis   Naproxen Nausea And Vomiting      Medication List       Accurate as of March 28, 2019 11:59 PM. If you have any questions, ask your nurse or doctor.        STOP taking these medications   methocarbamol 750 MG tablet Commonly known as: Robaxin-750 Stopped by: Elveria Risingina Phu Record, NP   tiZANidine 4 MG tablet Commonly known as: ZANAFLEX Stopped by: Elveria Risingina Larence Thone, NP     TAKE these medications   acetaminophen 500 MG tablet Commonly known as: TYLENOL Take 1,000 mg by mouth every 6 (six) hours as needed for moderate pain.   EPINEPHrine 0.3 mg/0.3 mL Soaj injection Commonly known as: EpiPen 2-Pak Inject 0.3 mLs (0.3 mg total) into the muscle once.   ibuprofen 600 MG tablet Commonly known as: ADVIL Take 1 tablet (600 mg total) by mouth every 6 (six) hours as needed.   multivitamin with minerals Tabs tablet Take 1 tablet by mouth daily.   ondansetron 4 MG disintegrating tablet Commonly known as: Zofran ODT 4mg  ODT q4 hours prn nausea/vomit   OVER THE COUNTER MEDICATION Take 1 tablet by mouth daily.   Qudexy XR 50 MG Cs24 sprinkle capsule Generic drug: topiramate ER Take 1 capsule twice per the day   SUMAtriptan 20 MG/ACT nasal spray Commonly known as: IMITREX ONE SPRAY INTO NOSE AT ONSET OF MIGRAINE, AND MAY REPEAT IN 2 HOURS IF HEADACHE PERSISTS      Total time spent with the patient was 15 minutes, of which 50% or more was spent in counseling and coordination of care.  Elveria Risingina Elaynah Virginia NP-C [facility] Ph. [phone] Fax [phone]

## 2019-03-30 ENCOUNTER — Encounter (INDEPENDENT_AMBULATORY_CARE_PROVIDER_SITE_OTHER): Payer: Self-pay | Admitting: Family

## 2019-03-30 NOTE — Patient Instructions (Signed)
Thank you for meeting with me by Webex today.   Instructions for you until your next appointment are as follows: 1. Continue to take the Qudexy XR as you have been doing.  2. Continue to keep headache diaries and send them in monthly.  3. Work on not skipping meals, drinking plenty of water, getting enough sleep and managing stress as these things are known to reduce the frequency of tension headaches.  4. Please plan to return for follow up in 3 months or sooner if needed.

## 2019-04-03 DIAGNOSIS — R1013 Epigastric pain: Secondary | ICD-10-CM | POA: Diagnosis not present

## 2019-04-03 DIAGNOSIS — R1012 Left upper quadrant pain: Secondary | ICD-10-CM | POA: Diagnosis not present

## 2019-04-03 DIAGNOSIS — Z6826 Body mass index (BMI) 26.0-26.9, adult: Secondary | ICD-10-CM | POA: Diagnosis not present

## 2019-05-11 DIAGNOSIS — Z20828 Contact with and (suspected) exposure to other viral communicable diseases: Secondary | ICD-10-CM | POA: Diagnosis not present

## 2019-06-07 DIAGNOSIS — J029 Acute pharyngitis, unspecified: Secondary | ICD-10-CM | POA: Diagnosis not present

## 2019-06-21 ENCOUNTER — Ambulatory Visit (INDEPENDENT_AMBULATORY_CARE_PROVIDER_SITE_OTHER): Payer: Self-pay | Admitting: Family

## 2019-06-27 ENCOUNTER — Other Ambulatory Visit: Payer: Self-pay

## 2019-06-27 ENCOUNTER — Ambulatory Visit (INDEPENDENT_AMBULATORY_CARE_PROVIDER_SITE_OTHER): Payer: BLUE CROSS/BLUE SHIELD | Admitting: Family

## 2019-06-27 DIAGNOSIS — G43009 Migraine without aura, not intractable, without status migrainosus: Secondary | ICD-10-CM | POA: Diagnosis not present

## 2019-06-27 DIAGNOSIS — G43109 Migraine with aura, not intractable, without status migrainosus: Secondary | ICD-10-CM

## 2019-06-27 DIAGNOSIS — G44219 Episodic tension-type headache, not intractable: Secondary | ICD-10-CM

## 2019-06-27 DIAGNOSIS — G43809 Other migraine, not intractable, without status migrainosus: Secondary | ICD-10-CM

## 2019-06-27 DIAGNOSIS — Z86718 Personal history of other venous thrombosis and embolism: Secondary | ICD-10-CM

## 2019-06-27 DIAGNOSIS — Z23 Encounter for immunization: Secondary | ICD-10-CM | POA: Diagnosis not present

## 2019-06-27 NOTE — Progress Notes (Signed)
This is a Pediatric Specialist E-Visit follow up consult provided via WebEx Melissa Boyd consented to an E-Visit consult today.  Location of patient: Melissa Boyd is at home Location of provider: Elveria Rising, MD is in offcie Patient was referred by Sigmund Hazel, MD   The following participants were involved in this E-Visit: Patient, CMA, provider  Chief Complain/ Reason for E-Visit today: Headaches Total time on call: 15 min Follow up: 4 months     Melissa Boyd   MRN:  [id number]  1996/01/21   Provider: Elveria Rising NP-C Location of Care: [facility]  Visit type: Routine visit  Last visit: 03/28/2019  Referral source: Verne Carrow, MD History from: patient and chcn chart  Brief history:  History of migraine and tension headaches. She is taking and tolerating Qudexy XR 50mg  for migraine prevention. She keeps headache diaries and send them in monthly. Melissa Boyd has occasional tension headaches that do not require treatment. With the migraines, she has onset of blurry vision, then holocephalic pain and nausea, as well as visual distortion at times.  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 In July she had 23 tension headaches, 9 of which required treatment and 1 severe migraine. Some of the tension headaches she noted that her eyes felt weird but that the headache did not progress. The remaining days in the month were headache free. In August she had 18 tension headaches, 4 of which required treatment, and 1 migraine. The remainder of days were headache free. In September she had 24 tension headaches, 7 of which required treatment and 1 severe migraine. The remainder of days were headache free. Thus far in October she has had 7 tension headaches, 1 of which required treatment and no migraines. The remainder of the days were headache free.   Melissa Boyd feels that her headaches are largely triggered by stress. She is in fast paced clinical program and says that she has little time on clinic days to exercise or get sufficient rest. She is trying to find ways to work on that. Melissa Boyd will graduate in December and is looking forward to rest and preparing for her licensing examination after that.   Melissa Boyd has been otherwise generally healthy since she was last seen. She tries to avoid skipping meals and works to drink plenty of water each day. She used to run for exercise but has been unable to do so because of long clinical days. She has no other health concerns today other than previously mentioned.    Review of systems: Please see HPI for neurologic and other pertinent review of systems. Otherwise all other systems were reviewed and were negative.  Problem List: Patient Active Problem List   Diagnosis Date Noted  . Palpitations 09/10/2015  . Idiopathic urticaria 07/01/2015  . Food allergy 07/01/2015  . Allergic rhinitis 06/28/2015  . Subjective visual disturbance of left eye 05/29/2015  . Neck pain on left side 05/27/2015  . History of deep vein thrombosis of lower extremity 11/05/2014  . 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 Awareness of heartbeats 08/07/2014  . Paroxysmal supraventricular tachycardia (HCC) 08/07/2014  . Breathlessness on exertion 08/07/2014  . Migraine with aura 12/08/2012  . Migraine variant 12/08/2012  . Migraine without aura 12/08/2012  . Episodic tension type headache 12/08/2012  . Ovarian cyst 09/01/2012  . Migraines 04/18/2012  . Headache, migraine 04/18/2012  Past Medical History:  Diagnosis Date  . DVT (deep venous thrombosis) ([place])   . Headache, migraine   . Urticaria     Past medical history comments: See HPI Copied from previous record: She had sports injury of strained hamstring and hip flexor, as well as strained back muscles 2014 while exercising. She had  a deep vein thrombosis and a common peroneal neuropathy and foot dropin 2016.  Surgical history: Past Surgical History:  Procedure Laterality Date  . SHOULDER SURGERY     left  . WISDOM TOOTH EXTRACTION       Family history: family history includes Alzheimer's disease in her paternal grandfather; Anemia in her father; Hypertension in her maternal aunt.   Social history: Social History   Socioeconomic History  . Marital status: Single    Spouse name: Not on file  . Number of children: Not on file  . Years of education: Not on file  . Highest education level: Not on file  Occupational History  . Not on file  Social Needs  . Financial resource strain: Not on file  . Food insecurity    Worry: Not on file    Inability: Not on file  . Transportation needs    Medical: Not on file    Non-medical: Not on file  Tobacco Use  . Smoking status: Never Smoker  . Smokeless tobacco: Never Used  Substance and Sexual Activity  . Alcohol use: No  . Drug use: No  . Sexual activity: Yes    Birth control/protection: Condom    Comment: lo-loestrin fe   Lifestyle  . Physical activity    Days per week: Not on file    Minutes per session: Not on file  . Stress: Not on file  Relationships  . Social Herbalist on phone: Not on file    Gets together: Not on file    Attends religious service: Not on file    Active member of club or organization: Not on file    Attends meetings of clubs or organizations: Not on file    Relationship status: Not on file  . Intimate partner violence    Fear of current or ex partner: Not on file    Emotionally abused: Not on file    Physically abused: Not on file    Forced sexual activity: Not on file  Other Topics Concern  . 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 She enjoys track and field.   Melissa Boyd lives with mom and dad and some on  college campus.        Past/failed meds: Topiramate IR  Allergies: Allergies  Allergen Reactions  . Other Anaphylaxis    Any products that contain shellfish  . Shellfish Allergy Anaphylaxis  . Naproxen Nausea And Vomiting      Immunizations:  There is no immunization history on file for this patient.    Diagnostics/Screenings: 12/30/2014 - MRI cervical spine with and without contrast - normal 12/30/2014 - MRI brain with and without contrast - normal  Physical Exam: There were no vitals taken for this visit.  General: Well developed, well nourished young woman, seated in her home, in no evident distress, black hair, brown eyes, right handed Head: Head normocephalic and atraumatic. Neck: Supple Musculoskeletal: No obvious deformities or scoliosis Skin: No rashes or neurocutaneous lesions  Neurologic Exam Mental Status: Awake  and fully alert.  Oriented to place and time.  Recent and remote memory intact.  Attention span, concentration, and fund of knowledge appropriate.  Mood and affect appropriate. Cranial Nerves: Extraocular movements full without nystagmus. Hearing intact and symmetric to voice on Webex.  Facial sensation intact.  Face and tongue move normally and symmetrically. Shoulder shrug normal Motor: Normal functional bulk, tone and strength Sensory: Intact to touch and temperature in all extremities.  Coordination: Finger-to-nose and heel-to shin performed accurately bilaterally. Gait and Station: Arises from chair without difficulty.  Stance is normal. Gait demonstrates normal stride length and balance.  Impression: 1. Migraine without aura 2. Migraine with aura 3. Episodic tension headache 4. Hx DVT left lower extremity 5. Hx left common peroneal neuropathy  Recommendations for plan of care: The [facility] were [facility]. [name] has neither had nor required imaging or lab studies since the last visit. She is a 23 year old young woman  with history of migraine and tension headaches. She is taking and tolerating Qudexy XR and has had improvement in migraine frequency. She has a considerable number of tension headaches, and we talked about lifestyle measures to help improve that, with stress management in particular. I asked her to continue to keep headache diaries and to return for follow up in 4 months or sooner if needed. At her next visit, we will work on transition of care to an adult neurology provider. [name] agreed with the plans made today.   The medication list was reviewed and reconciled. No changes were made in the prescribed medications today. A complete medication list was provided to the patient.  Allergies as of 06/27/2019      Reactions   Other Anaphylaxis   Any products that contain shellfish   Shellfish Allergy Anaphylaxis   Naproxen Nausea And Vomiting      Medication List       Accurate as of June 27, 2019 12:26 PM. If you have any questions, ask your nurse or doctor.        acetaminophen 500 MG tablet Commonly known as: TYLENOL Take 1,000 mg by mouth every 6 (six) hours as needed for moderate pain.   EPINEPHrine 0.3 mg/0.3 mL Soaj injection Commonly known as: EpiPen 2-Pak Inject 0.3 mLs (0.3 mg total) into the muscle once.   ibuprofen 600 MG tablet Commonly known as: ADVIL Take 1 tablet (600 mg total) by mouth every 6 (six) hours as needed.   ipratropium 0.06 % nasal spray Commonly known as: ATROVENT USE 2 SPRAYS IN NOSTRIL(S) 4 TIMES A DAY   multivitamin with minerals Tabs tablet Take 1 tablet by mouth daily.   omeprazole 20 MG capsule Commonly known as: PRILOSEC TAKE 1 CAPSULE BY MOUTH EVERY DAY 30 MINUTES BEFORE MORNING MEAL   ondansetron 4 MG disintegrating tablet Commonly known as: Zofran ODT 4mg  ODT q4 hours prn nausea/vomit   OVER THE COUNTER MEDICATION Take 1 tablet by mouth daily.   Qudexy XR 50 MG Cs24 sprinkle capsule Generic drug: topiramate ER Take 1 capsule  twice per the day   SUMAtriptan 20 MG/ACT nasal spray Commonly known as: IMITREX ONE SPRAY INTO NOSE AT ONSET OF MIGRAINE, AND MAY REPEAT IN 2 HOURS IF HEADACHE PERSISTS       Total time spent on the Webex with the patient was 15 minutes, of which 50% or more was spent in counseling and coordination of care.  Elveria Risingina Skyann Ganim NP-C [facility] Ph. [phone] Fax [phone]

## 2019-06-28 ENCOUNTER — Encounter (INDEPENDENT_AMBULATORY_CARE_PROVIDER_SITE_OTHER): Payer: Self-pay | Admitting: Family

## 2019-06-28 MED ORDER — QUDEXY XR 50 MG PO CS24: EXTENDED_RELEASE_CAPSULE | ORAL | 5 refills | Status: DC

## 2019-06-28 NOTE — Patient Instructions (Signed)
Thank you for meeting with me by Webex today.   Instructions for you until your next appointment are as follows: 1. Continue to take Qudexy XR 50mg  - 1 capsule in the morning and 1 capsule at night 2. Remember that is very important for you to drink plenty of water while taking this medication 3. Work on Child psychotherapist, as well as not skipping meals on days you are very busy. Try to get at least 8 hours of sleep each night 4. Continue to keep headache diaries and send them in monthly 5. Please sign up for MyChart if you have not done so 6. Please plan to return for follow up in 4 months or sooner if needed.

## 2019-08-08 DIAGNOSIS — Z202 Contact with and (suspected) exposure to infections with a predominantly sexual mode of transmission: Secondary | ICD-10-CM | POA: Diagnosis not present

## 2019-08-22 ENCOUNTER — Telehealth (INDEPENDENT_AMBULATORY_CARE_PROVIDER_SITE_OTHER): Payer: Self-pay | Admitting: Family

## 2019-08-22 ENCOUNTER — Encounter (INDEPENDENT_AMBULATORY_CARE_PROVIDER_SITE_OTHER): Payer: Self-pay

## 2019-08-22 DIAGNOSIS — G43009 Migraine without aura, not intractable, without status migrainosus: Secondary | ICD-10-CM

## 2019-08-22 MED ORDER — ONDANSETRON 4 MG PO TBDP: ORAL_TABLET | ORAL | 0 refills | Status: DC

## 2019-08-22 NOTE — Telephone Encounter (Signed)
I received a note from [place] asking for a letter for her upcoming Occupational Therapy examination. I wrote the letter then attempted to call her to let her know that it is ready for pick up but the voicemail was full. I will send a MyChart message.   Melissa Boyd also submitted a headache diary for November that showed 17 tension headaches, 2 of which required treatment, 1 migraine and the remainder of the days were headache free. I recommended no changes in her treatment plan at this time. TG

## 2019-08-22 NOTE — Telephone Encounter (Signed)
I called Melissa Boyd back and told her that the letter she requested was ready for pick up. We talked about her upcoming OT exam and I recommended that she take her medication with her in the event that she has a migraine on the day of the testing. She needs refill on Ondansetron ODT so I will send that in now. TG

## 2019-08-22 NOTE — Telephone Encounter (Signed)
Patient is returning Tina's call and can be reached at [phone]. Cameron Sprang

## 2019-08-31 DIAGNOSIS — Z20828 Contact with and (suspected) exposure to other viral communicable diseases: Secondary | ICD-10-CM | POA: Diagnosis not present

## 2019-09-01 ENCOUNTER — Telehealth (INDEPENDENT_AMBULATORY_CARE_PROVIDER_SITE_OTHER): Payer: Self-pay | Admitting: Family

## 2019-09-01 NOTE — Telephone Encounter (Signed)
I updated the letter and emailed it to [place] as requested. TG

## 2019-09-01 NOTE — Telephone Encounter (Signed)
Spoke with the patient about her phone message. She states that the lady that is in charge of the program read the letter and needs more information. She states that the letter needs to include the water breaks needed with extra time in a separate room which will allow to be in a separate place during a migraine episode. Please create today before the office closes or scan and email to [email or web address]

## 2019-09-01 NOTE — Telephone Encounter (Signed)
Who's calling (name and relationship to patient) : [place] Searles (self)  Best contact number: [phone]  Provider they see: Rockwell Germany   Reason for call:  Floy Sabina called in stating that Otila Kluver had written her an accomodation letter for school but that it is needing some specifications to it. Please call Carianne to get the exact wording needed. Needs this letter ASAP to take her boards. Please advise.   Call ID:      PRESCRIPTION REFILL ONLY  Name of prescription:  Pharmacy:

## 2019-10-05 DIAGNOSIS — R05 Cough: Secondary | ICD-10-CM | POA: Diagnosis not present

## 2019-10-05 DIAGNOSIS — G44209 Tension-type headache, unspecified, not intractable: Secondary | ICD-10-CM | POA: Diagnosis not present

## 2019-10-06 DIAGNOSIS — R05 Cough: Secondary | ICD-10-CM | POA: Diagnosis not present

## 2019-10-06 DIAGNOSIS — G44209 Tension-type headache, unspecified, not intractable: Secondary | ICD-10-CM | POA: Diagnosis not present

## 2019-10-10 ENCOUNTER — Telehealth (INDEPENDENT_AMBULATORY_CARE_PROVIDER_SITE_OTHER): Payer: Self-pay | Admitting: Family

## 2019-10-10 DIAGNOSIS — G43809 Other migraine, not intractable, without status migrainosus: Secondary | ICD-10-CM

## 2019-10-10 DIAGNOSIS — G43009 Migraine without aura, not intractable, without status migrainosus: Secondary | ICD-10-CM

## 2019-10-10 DIAGNOSIS — G43109 Migraine with aura, not intractable, without status migrainosus: Secondary | ICD-10-CM

## 2019-10-10 MED ORDER — QUDEXY XR 50 MG PO CS24: EXTENDED_RELEASE_CAPSULE | ORAL | 5 refills | Status: DC

## 2019-10-10 NOTE — Telephone Encounter (Signed)
I received Briana's headache diary for December by fax. On the diary she had recorded 16 tension headaches, 2 migraines and the remainder of the days were headache free. 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 We talked about managing stress, drinking plenty of water and getting enough rest.   Tameah also reported that her insurance will no longer cover Qudexy XR.  I checked with the rep for the drug and found that [facility] will provide it for her. I gave that information to [facility] and she agreed for the Qudexy XR prescription to be sent to [facility]. I sent the Rx there and will mail Ladashia more headache diaries.  TG

## 2019-10-20 ENCOUNTER — Encounter (INDEPENDENT_AMBULATORY_CARE_PROVIDER_SITE_OTHER): Payer: Self-pay | Admitting: Family

## 2019-10-26 ENCOUNTER — Other Ambulatory Visit: Payer: Self-pay | Admitting: Physician Assistant

## 2019-10-26 ENCOUNTER — Other Ambulatory Visit (HOSPITAL_COMMUNITY)
Admission: RE | Admit: 2019-10-26 | Discharge: 2019-10-26 | Disposition: A | Discharge status: Home / Self Care | Payer: BLUE CROSS/BLUE SHIELD | Source: Ambulatory Visit | Attending: Physician Assistant | Admitting: Physician Assistant

## 2019-10-26 DIAGNOSIS — Z124 Encounter for screening for malignant neoplasm of cervix: Secondary | ICD-10-CM | POA: Insufficient documentation

## 2019-10-26 DIAGNOSIS — R102 Pelvic and perineal pain: Secondary | ICD-10-CM | POA: Diagnosis not present

## 2019-10-26 DIAGNOSIS — N939 Abnormal uterine and vaginal bleeding, unspecified: Secondary | ICD-10-CM | POA: Diagnosis not present

## 2019-10-26 DIAGNOSIS — N898 Other specified noninflammatory disorders of vagina: Secondary | ICD-10-CM | POA: Diagnosis not present

## 2019-10-30 ENCOUNTER — Ambulatory Visit (INDEPENDENT_AMBULATORY_CARE_PROVIDER_SITE_OTHER): Payer: BLUE CROSS/BLUE SHIELD | Admitting: Family

## 2019-11-01 ENCOUNTER — Telehealth (INDEPENDENT_AMBULATORY_CARE_PROVIDER_SITE_OTHER): Payer: Self-pay | Admitting: Neurology

## 2019-11-01 ENCOUNTER — Other Ambulatory Visit: Payer: Self-pay | Admitting: Physician Assistant

## 2019-11-01 DIAGNOSIS — R102 Pelvic and perineal pain: Secondary | ICD-10-CM

## 2019-11-01 LAB — CYTOLOGY - PAP
Comment: NEGATIVE
Diagnosis: UNDETERMINED — AB
High risk HPV: POSITIVE — AB

## 2019-11-01 NOTE — Telephone Encounter (Signed)
°  Who's calling (name and relationship to patient) : [name],[name] Best contact number: [phone] Provider they see: Nab Reason for call: Mom is not able to get Melissa Boyd's miagrain under control.  Please advise.     PRESCRIPTION REFILL ONLY  Name of prescription:  Pharmacy:

## 2019-11-01 NOTE — Telephone Encounter (Signed)
Provider was marked wrong for this patient and she is seeing [name] tomorrow via my chart video visit

## 2019-11-02 ENCOUNTER — Telehealth (INDEPENDENT_AMBULATORY_CARE_PROVIDER_SITE_OTHER): Payer: BLUE CROSS/BLUE SHIELD | Admitting: Family

## 2019-11-02 ENCOUNTER — Encounter (INDEPENDENT_AMBULATORY_CARE_PROVIDER_SITE_OTHER): Payer: Self-pay | Admitting: Family

## 2019-11-02 DIAGNOSIS — G43009 Migraine without aura, not intractable, without status migrainosus: Secondary | ICD-10-CM

## 2019-11-02 DIAGNOSIS — G44219 Episodic tension-type headache, not intractable: Secondary | ICD-10-CM

## 2019-11-02 NOTE — Patient Instructions (Addendum)
Thank you for talking with me by phone today.   Instructions for you until your next appointment are as follows: 1. Continue keeping headache diaries as you have been doing 2. Continue taking the Qudexy XR daily 3. Let me know if you have any questions or concerns 4. Please plan to return for follow up in 3 months or sooner if needed. We will talk about referring you to an adult neurology provider at that time.

## 2019-11-02 NOTE — Telephone Encounter (Signed)
I spoke with patient today and she did not report a migraine yesterday. TG

## 2019-11-02 NOTE — Progress Notes (Signed)
This is a Pediatric Specialist E-Visit follow up consult provided via Telephone Eather Colas consented to an E-Visit consult today.  Location of patient: Melissa Boyd is at home Location of provider: Rockwell [place], NP-C is at home Patient was referred by Kathyrn Lass, MD   The following participants were involved in this E-Visit: NP & patient  Chief Complain/ Reason for E-Visit today: Migraine follow up Total time on call: 10 min Follow up: 3 months     Tobie Perdue   MRN:  [id number]  11-15-1995   Provider: Rockwell Germany NP-C Location of Care: [facility]  Visit type: Routine return visit  Last visit: 06/27/2019  Referral source: Kathyrn Lass, MD History from:  Epic chart and patient  Brief history:  Copied from previous record: History of migraine and tension headaches. She is taking and tolerating Qudexy XR 50mg  for migraine prevention. She keeps headache diaries and send them in monthly. Arlisha has occasional tension headaches that do not require treatment. With the migraines, she has onset of blurry vision, then holocephalic pain and nausea, as well as visual distortion at times. She has finished college and recently sat for boards in Occupational Therapy.  Today's concerns: Susette reports today that in January she had 15 tension headaches, 4 of which required treatment and 2 migraines. The remainder of days were headache free. Thus far in February she has had 7 tension headaches, 1 of which required treatment. She believes that she had more headaches in January because of the stress of sitting for OT boards. 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   Jaimarie has been otherwise generally healthy since she was last seen. She has no other health concerns today other than previously mentioned.   Review of systems: Please see HPI for neurologic and other pertinent review of systems.  Otherwise all other systems were reviewed and were negative.  Problem List: Patient Active Problem List   Diagnosis Date Noted  . Palpitations 09/10/2015  . Idiopathic urticaria 07/01/2015  . Food allergy 07/01/2015  . Allergic rhinitis 06/28/2015  . Subjective visual disturbance of left eye 05/29/2015  . Neck pain on left side 05/27/2015  . History of deep vein thrombosis of lower extremity 11/05/2014  . 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 Awareness of heartbeats 08/07/2014  . Paroxysmal supraventricular tachycardia ([place]) 08/07/2014  . Breathlessness on exertion 08/07/2014  . Migraine with aura 12/08/2012  . Migraine variant 12/08/2012  . Migraine without aura 12/08/2012  . Episodic tension type headache 12/08/2012  . Ovarian cyst 09/01/2012  . Migraines 04/18/2012  . Headache, migraine 04/18/2012     Past Medical History:  Diagnosis Date  . DVT (deep venous thrombosis) ([place])   . Headache, migraine   . Urticaria     Past medical history comments: See HPI Copied from previous record: She had sports injury of strained hamstring and hip flexor, as well as strained back muscles 2014 while exercising. She had a deep vein thrombosis and a common peroneal neuropathy and foot dropin 2016.  Surgical history: Past Surgical History:  Procedure Laterality Date  . SHOULDER SURGERY     left  . WISDOM TOOTH EXTRACTION       Family history: family history includes Alzheimer's disease in her paternal grandfather; Anemia in her father; Hypertension in her maternal aunt.   Social history: Social History   Socioeconomic History  . Marital  status: Single    Spouse name: Not on file  . Number of children: Not on file  . Years of education: Not on file  . Highest education level: Not on file  Occupational History  . Not on file  Tobacco Use  . Smoking status: Never Smoker  . Smokeless tobacco: Never Used  Substance and Sexual Activity  . Alcohol  use: No  . Drug use: No  . Sexual activity: Yes    Birth control/protection: Condom    Comment: lo-loestrin fe   Other Topics Concern  . 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 She enjoys track and field.   Venus lives with mom and dad and some on college campus.      Social Determinants of Health   Financial Resource Strain:   . Difficulty of Paying Living Expenses: Not on file  Food Insecurity:   . Worried About Programme researcher, broadcasting/film/video in the Last Year: Not on file  . Ran Out of Food in the Last Year: Not on file  Transportation Needs:   . Lack of Transportation (Medical): Not on file  . Lack of Transportation (Non-Medical): Not on file  Physical Activity:   . Days of Exercise per Week: Not on file  . Minutes of Exercise per Session: Not on file  Stress:   . Feeling of Stress : Not on file  Social Connections:   . Frequency of Communication with Friends and Family: Not on file  . Frequency of Social Gatherings with Friends and Family: Not on file  . Attends Religious Services: Not on file  . Active Member of Clubs or Organizations: Not on file  . Attends Banker Meetings: Not on file  . Marital Status: Not on file  Intimate Partner Violence:   . Fear of Current or Ex-Partner: Not on file  . Emotionally Abused: Not on file  . Physically Abused: Not on file  . Sexually Abused: Not on file      Past/failed meds: Topiramate IR  Allergies: Allergies  Allergen Reactions  . Other Anaphylaxis    Any products that contain shellfish  . Shellfish Allergy Anaphylaxis  . Naproxen Nausea And Vomiting      Immunizations:  There is no immunization history on file for this patient.    Diagnostics/Screenings: 12/30/2014 - MRI cervical spine with and without contrast - normal 12/30/2014 - MRI brain with and without contrast - normal  Physical  Exam: There were no vitals taken for this visit.  There was no examination as it was a telephone visit.  Impression: 1. Migraine without aura 2. History of migraine with aura 3. Episodic tension headache 4. History of DVT left lower extremity 5. History of left common peroneal neuropathy   Recommendations for plan of care: The patient's previous [facility] records were reviewed. Kymiah has neither had nor required imaging or lab studies since the last visit. She is a 24 year old woman with history of migraine and tension headaches. She is taking and tolerating Qudexy XR and is doing well at this time. I asked her to continue to keep headache diaries and to continue to take the Qudexy XR as prescribed. Now that Jhordyn has graduated from college, we talked about transition of care to an adult neurology practice. Since today was a telephone visit due to technical difficulties with establishing a video visit,  I will do that referral at her next visit. Abel agreed with the plans made today.   The medication list was reviewed and reconciled. No changes were made in the prescribed medications today. A complete medication list was provided to the patient.  Allergies as of 11/02/2019      Reactions   Other Anaphylaxis   Any products that contain shellfish   Shellfish Allergy Anaphylaxis   Naproxen Nausea And Vomiting      Medication List       Accurate as of November 02, 2019  2:54 PM. If you have any questions, ask your nurse or doctor.        acetaminophen 500 MG tablet Commonly known as: TYLENOL Take 1,000 mg by mouth every 6 (six) hours as needed for moderate pain.   EPINEPHrine 0.3 mg/0.3 mL Soaj injection Commonly known as: EpiPen 2-Pak Inject 0.3 mLs (0.3 mg total) into the muscle once.   ibuprofen 600 MG tablet Commonly known as: ADVIL Take 1 tablet (600 mg total) by mouth every 6 (six) hours as needed.   ipratropium 0.06 % nasal spray Commonly known as: ATROVENT USE 2  SPRAYS IN NOSTRIL(S) 4 TIMES A DAY   multivitamin with minerals Tabs tablet Take 1 tablet by mouth daily.   omeprazole 20 MG capsule Commonly known as: PRILOSEC TAKE 1 CAPSULE BY MOUTH EVERY DAY 30 MINUTES BEFORE MORNING MEAL   ondansetron 4 MG disintegrating tablet Commonly known as: Zofran ODT Place 1 tablet under the tongue at onset of nausea and vomiting. May repeat in 8 hours if needed.   OVER THE COUNTER MEDICATION Take 1 tablet by mouth daily.   Qudexy XR 50 MG Cs24 sprinkle capsule Generic drug: topiramate ER Take 1 capsule twice per the day   SUMAtriptan 20 MG/ACT nasal spray Commonly known as: IMITREX ONE SPRAY INTO NOSE AT ONSET OF MIGRAINE, AND MAY REPEAT IN 2 HOURS IF HEADACHE PERSISTS       Total time spent on the phone with the patient was 10 minutes, of which 50% or more was spent in counseling and coordination of care.  Elveria Rising NP-C [facility] Ph. [phone] Fax [phone]

## 2019-11-06 ENCOUNTER — Ambulatory Visit (INDEPENDENT_AMBULATORY_CARE_PROVIDER_SITE_OTHER): Payer: BLUE CROSS/BLUE SHIELD

## 2019-11-06 ENCOUNTER — Other Ambulatory Visit: Payer: Self-pay

## 2019-11-06 DIAGNOSIS — R102 Pelvic and perineal pain: Secondary | ICD-10-CM | POA: Diagnosis not present

## 2019-11-27 DIAGNOSIS — N870 Mild cervical dysplasia: Secondary | ICD-10-CM | POA: Diagnosis not present

## 2019-11-27 DIAGNOSIS — N871 Moderate cervical dysplasia: Secondary | ICD-10-CM | POA: Diagnosis not present

## 2019-12-05 DIAGNOSIS — R87810 Cervical high risk human papillomavirus (HPV) DNA test positive: Secondary | ICD-10-CM | POA: Diagnosis not present

## 2019-12-05 DIAGNOSIS — N871 Moderate cervical dysplasia: Secondary | ICD-10-CM | POA: Diagnosis not present

## 2019-12-05 DIAGNOSIS — R102 Pelvic and perineal pain: Secondary | ICD-10-CM | POA: Diagnosis not present

## 2019-12-08 ENCOUNTER — Telehealth (INDEPENDENT_AMBULATORY_CARE_PROVIDER_SITE_OTHER): Payer: Self-pay | Admitting: Family

## 2019-12-08 DIAGNOSIS — G43009 Migraine without aura, not intractable, without status migrainosus: Secondary | ICD-10-CM

## 2019-12-08 MED ORDER — TOPIRAMATE ER 50 MG PO CAP24: ORAL_CAPSULE | ORAL | 5 refills | Status: DC

## 2019-12-08 NOTE — Telephone Encounter (Signed)
Who's calling (name and relationship to patient) : Melissa Boyd self   Best contact number: [phone]  Provider they see: Elveria Rising  Reason for call: [facility] has changed their policy on how they fill Qudexy. Needs to speak to [name] about how to go forward, may need to change medicaiton.   Call ID:      PRESCRIPTION REFILL ONLY  Name of prescription:  Pharmacy:

## 2019-12-08 NOTE — Telephone Encounter (Signed)
I called Melissa Boyd. She said that [facility] told her that the copay for Qudexy XR will now be $120/month. I recommended changing to Topiramate ER and sent in an Rx to her local pharmacy. TG

## 2019-12-11 DIAGNOSIS — Z111 Encounter for screening for respiratory tuberculosis: Secondary | ICD-10-CM | POA: Diagnosis not present

## 2019-12-27 DIAGNOSIS — R109 Unspecified abdominal pain: Secondary | ICD-10-CM | POA: Diagnosis not present

## 2019-12-27 DIAGNOSIS — N72 Inflammatory disease of cervix uteri: Secondary | ICD-10-CM | POA: Diagnosis not present

## 2019-12-27 DIAGNOSIS — N871 Moderate cervical dysplasia: Secondary | ICD-10-CM | POA: Diagnosis not present

## 2019-12-27 DIAGNOSIS — Z3202 Encounter for pregnancy test, result negative: Secondary | ICD-10-CM | POA: Diagnosis not present

## 2019-12-28 DIAGNOSIS — R7611 Nonspecific reaction to tuberculin skin test without active tuberculosis: Secondary | ICD-10-CM | POA: Diagnosis not present

## 2019-12-28 DIAGNOSIS — Z03818 Encounter for observation for suspected exposure to other biological agents ruled out: Secondary | ICD-10-CM | POA: Diagnosis not present

## 2019-12-28 DIAGNOSIS — Z20828 Contact with and (suspected) exposure to other viral communicable diseases: Secondary | ICD-10-CM | POA: Diagnosis not present

## 2020-01-08 DIAGNOSIS — R109 Unspecified abdominal pain: Secondary | ICD-10-CM | POA: Diagnosis not present

## 2020-01-08 DIAGNOSIS — K5900 Constipation, unspecified: Secondary | ICD-10-CM | POA: Diagnosis not present

## 2020-01-08 DIAGNOSIS — N871 Moderate cervical dysplasia: Secondary | ICD-10-CM | POA: Diagnosis not present

## 2020-01-22 ENCOUNTER — Ambulatory Visit (INDEPENDENT_AMBULATORY_CARE_PROVIDER_SITE_OTHER): Payer: BLUE CROSS/BLUE SHIELD | Admitting: Family

## 2020-01-22 ENCOUNTER — Encounter (INDEPENDENT_AMBULATORY_CARE_PROVIDER_SITE_OTHER): Payer: Self-pay | Admitting: Family

## 2020-01-22 ENCOUNTER — Other Ambulatory Visit: Payer: Self-pay

## 2020-01-22 VITALS — BP 110/80 | HR 64 | Ht 67.5 in | Wt 181.4 lb

## 2020-01-22 DIAGNOSIS — G43009 Migraine without aura, not intractable, without status migrainosus: Secondary | ICD-10-CM

## 2020-01-22 DIAGNOSIS — G44219 Episodic tension-type headache, not intractable: Secondary | ICD-10-CM | POA: Diagnosis not present

## 2020-01-22 MED ORDER — SUMATRIPTAN 20 MG/ACT NA SOLN: NASAL | 5 refills | Status: DC

## 2020-01-22 NOTE — Progress Notes (Signed)
Melissa Boyd   MRN:  [id number]  1996-04-19   Provider: Rockwell Germany NP-C Location of Care: [facility]  Visit type: Routine visit  Last visit: 06/27/2019  Referral source: Everitt Amber, MD History from: patient and chcn chart  Brief history:  Copied from previous record: History of migraine and tension headaches. She is taking and tolerating Trokendi XR 50mg  for migraine prevention. She keeps headache diaries and send them in monthly. Ciin has occasional tension headaches that do not require treatment. With the migraines, she has onset of blurry vision, then holocephalic pain and nausea, as well as visual distortion at times. She is working as an Warden/ranger.  Today's concerns:  Melissa Boyd reports today that she has about 1 migraine per month. She brought in headache diaries showing 15 tension headaches and 2 migraines in January, 13 tension headaches in February, 10 tension headaches in March, and 10 tension headaches and 1 migraine in April. She has had 1 migraine thus far in May. When she has a migraine, she has visual disturbance, holocephalic pain, nausea, vomiting, dizziness and intolerance to light and sound. She takes Sumatriptan and rests to obtain relief. She is working in her first job as an Warden/ranger, and admits that she has been skipping meals and not drinking enough water on work days. She has made changes in her daily schedule and is hopeful that these lifestyle changes will help. She is not currently exercising but is thinking of restarting. She enjoys running and feels that she could do that safely given the current Covid 19 restrictions.   Melissa Boyd has been otherwise generally healthy and doing well. She has no other health concerns today other than previously mentioned.   Review of systems: Please see HPI for neurologic and other pertinent review of systems. Otherwise all other systems were reviewed and were  negative.  Problem List: Patient Active Problem List   Diagnosis Date Noted  . Palpitations 09/10/2015  . Idiopathic urticaria 07/01/2015  . Food allergy 07/01/2015  . Allergic rhinitis 06/28/2015  . Subjective visual disturbance of left eye 05/29/2015  . Neck pain on left side 05/27/2015  . History of deep vein thrombosis of lower extremity 11/05/2014  . 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 Awareness of heartbeats 08/07/2014  . Paroxysmal supraventricular tachycardia ([place]) 08/07/2014  . Breathlessness on exertion 08/07/2014  . Migraine with aura 12/08/2012  . Migraine variant 12/08/2012  . Migraine without aura 12/08/2012  . Episodic tension type headache 12/08/2012  . Ovarian cyst 09/01/2012  . Migraines 04/18/2012  . Headache, migraine 04/18/2012     Past Medical History:  Diagnosis Date  . DVT (deep venous thrombosis) ([place])   . Headache, migraine   . Urticaria     Past medical history comments: See HPI Copied from previous record: She had sports injury of strained hamstring and hip flexor, as well as strained back muscles 2014 while exercising. She had a deep vein thrombosis and a common peroneal neuropathy and foot dropin 2016.  Surgical history: Past Surgical History:  Procedure Laterality Date  . SHOULDER SURGERY     left  . WISDOM TOOTH EXTRACTION       Family history: family history includes Alzheimer's disease in her paternal grandfather; Anemia in her father; Hypertension in her maternal aunt.   Social history: Social History   Socioeconomic History  . Marital status: Single    Spouse name: Not on file  . Number of children: Not on  file  . Years of education: Not on file  . Highest education level: Not on file  Occupational History  . Not on file  Tobacco Use  . Smoking status: Never Smoker  . Smokeless tobacco: Never Used  Substance and Sexual Activity  . Alcohol use: No  . Drug use: No  . Sexual activity: Yes     Birth control/protection: Condom    Comment: lo-loestrin fe   Other Topics Concern  . 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 She enjoys track and field.   Kimari lives with mom and dad and some on college campus.      Social Determinants of Health   Financial Resource Strain:   . Difficulty of Paying Living Expenses:   Food Insecurity:   . Worried About Programme researcher, broadcasting/film/video in the Last Year:   . Barista in the Last Year:   Transportation Needs:   . Freight forwarder (Medical):   Marland Kitchen Lack of Transportation (Non-Medical):   Physical Activity:   . Days of Exercise per Week:   . Minutes of Exercise per Session:   Stress:   . Feeling of Stress :   Social Connections:   . Frequency of Communication with Friends and Family:   . Frequency of Social Gatherings with Friends and Family:   . Attends Religious Services:   . Active Member of Clubs or Organizations:   . Attends Banker Meetings:   Marland Kitchen Marital Status:   Intimate Partner Violence:   . Fear of Current or Ex-Partner:   . Emotionally Abused:   Marland Kitchen Physically Abused:   . Sexually Abused:     Past/failed meds: Topiramate IR  Allergies: Allergies  Allergen Reactions  . Other Anaphylaxis    Any products that contain shellfish  . Shellfish Allergy Anaphylaxis  . Naproxen Nausea And Vomiting     Immunizations:  There is no immunization history on file for this patient.    Diagnostics/Screenings: 12/30/2014 - MRI cervical spine with and without contrast - normal 12/30/2014 - MRI brain with and without contrast - normal   Physical Exam: BP 110/80   Pulse 64   Ht 5' 7.5" (1.715 m)   Wt 181 lb 6.4 oz (82.3 kg)   BMI 27.99 kg/m   General: Well developed, well nourished young woman, seated on exam table, in no evident distress, black hair, brown eyes, right handed Head: Head  normocephalic and atraumatic.  Oropharynx benign. Neck: Supple with no carotid bruits Cardiovascular: Regular rate and rhythm, no murmurs Respiratory: Breath sounds clear to auscultation Musculoskeletal: No obvious deformities or scoliosis Skin: No rashes or neurocutaneous lesions  Neurologic Exam Mental Status: Awake and fully alert.  Oriented to place and time.  Recent and remote memory intact.  Attention span, concentration, and fund of knowledge appropriate.  Mood and affect appropriate. Cranial Nerves: Fundoscopic exam reveals sharp disc margins.  Pupils equal, briskly reactive to light.  Extraocular movements full without nystagmus.  Visual fields full to confrontation.  Hearing intact and symmetric to finger rub.  Facial sensation intact.  Face tongue, palate move normally and symmetrically.  Neck flexion and extension normal. Motor: Normal bulk and tone. Normal strength in all tested extremity muscles. Sensory: Intact to touch and temperature in all extremities.  Coordination: Rapid alternating movements normal in all extremities.  Finger-to-nose and heel-to shin  performed accurately bilaterally.  Romberg negative. Gait and Station: Arises from chair without difficulty.  Stance is normal. Gait demonstrates normal stride length and balance.   Able to heel, toe and tandem walk without difficulty. Reflexes: 1+ and symmetric. Toes downgoing.  Impression: 1. Migraine without aura 2. History of migraine with aura 3. Episodic tension headache 4. History of DVT left lower extremity 5. History of left common peroneal neuropathy   Recommendations for plan of care: The patient's previous [facility] records were reviewed. Darnice has neither had nor required imaging or lab studies since the last visit. She is a 24 year old woman with history of migraine and tension headaches. She is taking and tolerating Trokendi XR for migraine prevention. She is experiencing about 1 migraine per month, and I talked  with her about potentially tapering the medication at her next visit. She has been skipping meals and not drinking much water during the day, and we talked about ways to improve that. We also talked about starting an exercise program, and working on stress management. Shanikwa is experiencing a fair number of tension headaches each month, and these lifestyle measures may help to reduce frequency of headaches over all. I will see Sabah back in about 3 months or sooner if needed. She agreed with the plans made today.   The medication list was reviewed and reconciled. No changes were made in the prescribed medications today. A complete medication list was provided to the patient.  Allergies as of 01/22/2020      Reactions   Other Anaphylaxis   Any products that contain shellfish   Shellfish Allergy Anaphylaxis   Naproxen Nausea And Vomiting      Medication List       Accurate as of Jan 22, 2020 11:59 PM. If you have any questions, ask your nurse or doctor.        acetaminophen 500 MG tablet Commonly known as: TYLENOL Take 1,000 mg by mouth every 6 (six) hours as needed for moderate pain.   EPINEPHrine 0.3 mg/0.3 mL Soaj injection Commonly known as: EpiPen 2-Pak Inject 0.3 mLs (0.3 mg total) into the muscle once.   ibuprofen 600 MG tablet Commonly known as: ADVIL Take 1 tablet (600 mg total) by mouth every 6 (six) hours as needed.   ipratropium 0.06 % nasal spray Commonly known as: ATROVENT USE 2 SPRAYS IN NOSTRIL(S) 4 TIMES A DAY   multivitamin with minerals Tabs tablet Take 1 tablet by mouth daily.   omeprazole 20 MG capsule Commonly known as: PRILOSEC TAKE 1 CAPSULE BY MOUTH EVERY DAY 30 MINUTES BEFORE MORNING MEAL   ondansetron 4 MG disintegrating tablet Commonly known as: Zofran ODT Place 1 tablet under the tongue at onset of nausea and vomiting. May repeat in 8 hours if needed.   OVER THE COUNTER MEDICATION Take 1 tablet by mouth daily.   SUMAtriptan 20 MG/ACT nasal  spray Commonly known as: IMITREX ONE SPRAY INTO NOSE AT ONSET OF MIGRAINE, AND MAY REPEAT IN 2 HOURS IF HEADACHE PERSISTS   Topiramate ER 50 MG Cp24 Commonly known as: TROKENDI XR Take 1 capsule twice per day       Total time spent with the patient was 20 minutes, of which 50% or more was spent in counseling and coordination of care.  Elveria Rising NP-C [facility] Ph. [phone] Fax [phone]

## 2020-01-23 ENCOUNTER — Encounter (INDEPENDENT_AMBULATORY_CARE_PROVIDER_SITE_OTHER): Payer: Self-pay | Admitting: Family

## 2020-01-23 NOTE — Patient Instructions (Signed)
Thank you for coming in today.   Instructions for you until your next appointment are as follows: 1. Continue taking the Trokendi XR 50mg  each day for migraine prevention. If you continue to do well, we may consider tapering this medication at your next visit 2. Work on not skipping meals and drinking more water each day as these things are known to affect headache frequency.  3. Consider starting an exercise program as exercise is known to help with headaches and stress management.  4. Please sign up for MyChart if you have not done so 5. Please plan to return for follow up in 3 months or sooner if needed.

## 2020-02-04 DIAGNOSIS — Z20828 Contact with and (suspected) exposure to other viral communicable diseases: Secondary | ICD-10-CM | POA: Diagnosis not present

## 2020-02-04 DIAGNOSIS — Z03818 Encounter for observation for suspected exposure to other biological agents ruled out: Secondary | ICD-10-CM | POA: Diagnosis not present

## 2020-02-09 DIAGNOSIS — Z20828 Contact with and (suspected) exposure to other viral communicable diseases: Secondary | ICD-10-CM | POA: Diagnosis not present

## 2020-02-09 DIAGNOSIS — Z03818 Encounter for observation for suspected exposure to other biological agents ruled out: Secondary | ICD-10-CM | POA: Diagnosis not present

## 2020-02-19 ENCOUNTER — Other Ambulatory Visit: Payer: Self-pay | Admitting: Gastroenterology

## 2020-02-19 DIAGNOSIS — R1032 Left lower quadrant pain: Secondary | ICD-10-CM | POA: Diagnosis not present

## 2020-02-19 DIAGNOSIS — K5900 Constipation, unspecified: Secondary | ICD-10-CM | POA: Diagnosis not present

## 2020-02-19 DIAGNOSIS — R109 Unspecified abdominal pain: Secondary | ICD-10-CM

## 2020-02-19 DIAGNOSIS — K219 Gastro-esophageal reflux disease without esophagitis: Secondary | ICD-10-CM | POA: Diagnosis not present

## 2020-02-21 DIAGNOSIS — M9901 Segmental and somatic dysfunction of cervical region: Secondary | ICD-10-CM | POA: Diagnosis not present

## 2020-02-21 DIAGNOSIS — M9905 Segmental and somatic dysfunction of pelvic region: Secondary | ICD-10-CM | POA: Diagnosis not present

## 2020-02-21 DIAGNOSIS — M9902 Segmental and somatic dysfunction of thoracic region: Secondary | ICD-10-CM | POA: Diagnosis not present

## 2020-02-21 DIAGNOSIS — M9903 Segmental and somatic dysfunction of lumbar region: Secondary | ICD-10-CM | POA: Diagnosis not present

## 2020-02-26 ENCOUNTER — Ambulatory Visit
Admission: RE | Admit: 2020-02-26 | Discharge: 2020-02-26 | Disposition: A | Discharge status: Home / Self Care | Payer: BLUE CROSS/BLUE SHIELD | Source: Ambulatory Visit | Attending: Gastroenterology | Admitting: Gastroenterology

## 2020-02-26 DIAGNOSIS — R109 Unspecified abdominal pain: Secondary | ICD-10-CM

## 2020-02-29 DIAGNOSIS — U071 COVID-19: Secondary | ICD-10-CM | POA: Diagnosis not present

## 2020-03-01 DIAGNOSIS — M9902 Segmental and somatic dysfunction of thoracic region: Secondary | ICD-10-CM | POA: Diagnosis not present

## 2020-03-01 DIAGNOSIS — M9905 Segmental and somatic dysfunction of pelvic region: Secondary | ICD-10-CM | POA: Diagnosis not present

## 2020-03-01 DIAGNOSIS — M9901 Segmental and somatic dysfunction of cervical region: Secondary | ICD-10-CM | POA: Diagnosis not present

## 2020-03-01 DIAGNOSIS — M9903 Segmental and somatic dysfunction of lumbar region: Secondary | ICD-10-CM | POA: Diagnosis not present

## 2020-03-15 DIAGNOSIS — M9903 Segmental and somatic dysfunction of lumbar region: Secondary | ICD-10-CM | POA: Diagnosis not present

## 2020-03-15 DIAGNOSIS — M9902 Segmental and somatic dysfunction of thoracic region: Secondary | ICD-10-CM | POA: Diagnosis not present

## 2020-03-15 DIAGNOSIS — M9905 Segmental and somatic dysfunction of pelvic region: Secondary | ICD-10-CM | POA: Diagnosis not present

## 2020-03-15 DIAGNOSIS — M9901 Segmental and somatic dysfunction of cervical region: Secondary | ICD-10-CM | POA: Diagnosis not present

## 2020-03-24 DIAGNOSIS — J0100 Acute maxillary sinusitis, unspecified: Secondary | ICD-10-CM | POA: Diagnosis not present

## 2020-03-24 DIAGNOSIS — Z20822 Contact with and (suspected) exposure to covid-19: Secondary | ICD-10-CM | POA: Diagnosis not present

## 2020-03-24 DIAGNOSIS — J0110 Acute frontal sinusitis, unspecified: Secondary | ICD-10-CM | POA: Diagnosis not present

## 2020-04-01 ENCOUNTER — Telehealth (INDEPENDENT_AMBULATORY_CARE_PROVIDER_SITE_OTHER): Payer: Self-pay | Admitting: Family

## 2020-04-01 DIAGNOSIS — M9901 Segmental and somatic dysfunction of cervical region: Secondary | ICD-10-CM | POA: Diagnosis not present

## 2020-04-01 DIAGNOSIS — M9905 Segmental and somatic dysfunction of pelvic region: Secondary | ICD-10-CM | POA: Diagnosis not present

## 2020-04-01 DIAGNOSIS — G43009 Migraine without aura, not intractable, without status migrainosus: Secondary | ICD-10-CM

## 2020-04-01 DIAGNOSIS — M9902 Segmental and somatic dysfunction of thoracic region: Secondary | ICD-10-CM | POA: Diagnosis not present

## 2020-04-01 DIAGNOSIS — M9903 Segmental and somatic dysfunction of lumbar region: Secondary | ICD-10-CM | POA: Diagnosis not present

## 2020-04-01 MED ORDER — TOPIRAMATE ER 50 MG PO CAP24: ORAL_CAPSULE | ORAL | 5 refills | Status: DC

## 2020-04-01 NOTE — Telephone Encounter (Signed)
  Who's calling (name and relationship to patient) : Amirrah (self)  Best contact number: [phone]  Provider they see: Elveria Rising  Reason for call: Patient has one pill left of the Qudexy. Patient wants to know if she should try to not take it for a trial period. If she needs to continue taking it, please send refill to pharmacy. She also needs a headache calendar placed up front.    PRESCRIPTION REFILL ONLY  Name of prescription: Qudexy  Pharmacy: [id number] - [place], [place] - [address]

## 2020-04-01 NOTE — Telephone Encounter (Signed)
I called and spoke with [place]. She said that she had 3 migraines thus far this month. I recommended that she continue on the Topiramate ER and reviewed the dose with her. I sent in a refill and encouraged Melissa Boyd to continue to keep headache diaries. She has already picked up some from the office. TG

## 2020-04-05 ENCOUNTER — Encounter (INDEPENDENT_AMBULATORY_CARE_PROVIDER_SITE_OTHER): Payer: Self-pay | Admitting: Family

## 2020-04-05 NOTE — Progress Notes (Unsigned)
I received headache diaries for May and June 2021. For May, she had 16 tension headaches, 5 of which required treatment. She had 1 migraine and 14 days headache free. In June she had 28 days of tension headache, 11 of which required treatment. She had no migraines and 2 days headache free. I have talked with [name] regarding these diaries and made no changes in her treatment plan. TG

## 2020-04-08 ENCOUNTER — Other Ambulatory Visit: Payer: Self-pay | Admitting: Gastroenterology

## 2020-04-08 ENCOUNTER — Other Ambulatory Visit (HOSPITAL_COMMUNITY): Payer: Self-pay | Admitting: Gastroenterology

## 2020-04-08 DIAGNOSIS — K5900 Constipation, unspecified: Secondary | ICD-10-CM | POA: Diagnosis not present

## 2020-04-08 DIAGNOSIS — R110 Nausea: Secondary | ICD-10-CM

## 2020-04-08 DIAGNOSIS — R1032 Left lower quadrant pain: Secondary | ICD-10-CM | POA: Diagnosis not present

## 2020-04-15 DIAGNOSIS — M9902 Segmental and somatic dysfunction of thoracic region: Secondary | ICD-10-CM | POA: Diagnosis not present

## 2020-04-15 DIAGNOSIS — M9905 Segmental and somatic dysfunction of pelvic region: Secondary | ICD-10-CM | POA: Diagnosis not present

## 2020-04-15 DIAGNOSIS — M9901 Segmental and somatic dysfunction of cervical region: Secondary | ICD-10-CM | POA: Diagnosis not present

## 2020-04-15 DIAGNOSIS — M9903 Segmental and somatic dysfunction of lumbar region: Secondary | ICD-10-CM | POA: Diagnosis not present

## 2020-04-18 DIAGNOSIS — Z20822 Contact with and (suspected) exposure to covid-19: Secondary | ICD-10-CM | POA: Diagnosis not present

## 2020-04-22 ENCOUNTER — Other Ambulatory Visit: Payer: Self-pay

## 2020-04-22 ENCOUNTER — Encounter (HOSPITAL_COMMUNITY)
Admission: RE | Admit: 2020-04-22 | Discharge: 2020-04-22 | Disposition: A | Discharge status: Home / Self Care | Payer: BLUE CROSS/BLUE SHIELD | Source: Ambulatory Visit | Attending: Gastroenterology | Admitting: Gastroenterology

## 2020-04-22 DIAGNOSIS — M79642 Pain in left hand: Secondary | ICD-10-CM | POA: Diagnosis not present

## 2020-04-22 DIAGNOSIS — M79645 Pain in left finger(s): Secondary | ICD-10-CM | POA: Diagnosis not present

## 2020-04-22 DIAGNOSIS — R109 Unspecified abdominal pain: Secondary | ICD-10-CM | POA: Diagnosis not present

## 2020-04-22 DIAGNOSIS — R110 Nausea: Secondary | ICD-10-CM | POA: Insufficient documentation

## 2020-04-22 MED ORDER — TECHNETIUM TC 99M MEBROFENIN IV KIT
5.0000 | PACK | Freq: Once | INTRAVENOUS | Status: AC | PRN
  Administered 2020-04-22: 5 via INTRAVENOUS

## 2020-04-23 DIAGNOSIS — Z20822 Contact with and (suspected) exposure to covid-19: Secondary | ICD-10-CM | POA: Diagnosis not present

## 2020-04-29 ENCOUNTER — Telehealth (INDEPENDENT_AMBULATORY_CARE_PROVIDER_SITE_OTHER): Payer: BLUE CROSS/BLUE SHIELD | Admitting: Family

## 2020-04-29 DIAGNOSIS — G44219 Episodic tension-type headache, not intractable: Secondary | ICD-10-CM | POA: Diagnosis not present

## 2020-04-29 DIAGNOSIS — G43009 Migraine without aura, not intractable, without status migrainosus: Secondary | ICD-10-CM

## 2020-04-29 DIAGNOSIS — J209 Acute bronchitis, unspecified: Secondary | ICD-10-CM | POA: Diagnosis not present

## 2020-04-29 NOTE — Progress Notes (Signed)
Melissa Boyd   MRN:  [id number]  [birth year]   Provider: Elveria Rising NP-C Location of Care: [facility]  Visit type: Telehealth visit  Last visit: 01/22/2020  Referral source: Verne Carrow, MD History from: patient and chcn chart  Brief history:  Copied from previous record: History of migraine and tension headaches. She is taking and tolerating Trokendi XR 50mg  for migraine prevention. She keeps headache diaries and send them in monthly. Buna has occasional tension headaches that do not require treatment. With the migraines, she has onset of blurry vision, then holocephalic pain and nausea, as well as visual distortion at times.She is working as an Melissa Boyd.  Today's concerns:  Melissa Boyd reports today that she had 2 migraines in July, along with increased frequency of tension headaches. She provided headache diaries that revealed 23 headaches in July, 10 of which required treatment, 2 migraines and 6 days headache free. Thus far in August she has had 13 tension headaches, 4 of which required treatment, and 2 days headache free. She says that she does not skip meals, that she drinks a good deal of water each day and that she typically gets 8 hours of sleep each night. She has not been exercising but plans to restart soon. She admits to some stress in that she has been interviewing for a new job and recently accepted at job at [employer]. She is moving to [facility] and looking forward to this new experience.   Camyra has been otherwise generally healthy since he was last seen. She has no other health concerns oday other than previously mentioned.  Review of systems: Please see HPI for neurologic and other pertinent review of systems. Otherwise all other systems were reviewed and were negative.  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   Awareness of heartbeats 08/07/2014   Paroxysmal supraventricular tachycardia ([facility]) 08/07/2014   Breathlessness on exertion 08/07/2014   Migraine with aura 12/08/2012   Migraine variant 12/08/2012   Migraine without aura 12/08/2012   Episodic tension type headache 12/08/2012   Ovarian cyst 09/01/2012   Migraines 04/18/2012   Headache, migraine 04/18/2012     Past Medical History:  Diagnosis Date   Allergy    Phreesia 04/29/2020   DVT (deep venous thrombosis) ([facility])    Headache, migraine    Urticaria     Past medical history comments: See HPI Copied from previous record: She had sports injury of strained hamstring and hip flexor, as well as strained back muscles 2014 while exercising. She had a deep vein thrombosis and a common peroneal neuropathy and foot dropin 2016.  Surgical history: Past Surgical History:  Procedure Laterality Date   SHOULDER SURGERY     left   WISDOM TOOTH EXTRACTION       Family history: family history includes Alzheimer's disease in her paternal grandfather; Anemia in her father; Hypertension in her maternal aunt.   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 She enjoys track and field.   [name] lives with mom and dad and some on college campus.      Social Determinants of Health   Financial Resource Strain:    Difficulty of Paying Living Expenses:   Food Insecurity:    Worried About Programme researcher, broadcasting/film/video in the Last Year:    Barista in the Last Year:   Transportation Needs:    Freight forwarder (Medical):    Lack of Transportation (Non-Medical):   Physical Activity:    Days of Exercise per Week:    Minutes of Exercise per Session:   Stress:    Feeling of Stress :   Social Connections:    Frequency of Communication with Friends and Family:    Frequency of Social Gatherings with Friends and Family:    Attends Religious Services:    Active Member of Clubs or Organizations:    Attends Engineer, structural:    Marital Status:   Intimate Partner Violence:    Fear of Current or Ex-Partner:    Emotionally Abused:    Physically Abused:    Sexually Abused:     Past/failed meds: Topiramate IR  Allergies: Allergies  Allergen Reactions   Other Anaphylaxis    Any products that contain shellfish   Shellfish Allergy Anaphylaxis   Naproxen Nausea And Vomiting   Immunizations:  There is no immunization history on file for this patient.   Diagnostics/Screenings: Copied from previous record; 12/30/2014 - MRI cervical spine with and without contrast - normal 12/30/2014 - MRI brain with and without contrast - normal  Physical Exam: LMP 04/12/2020   General: Well developed, well nourished young woman, seated at home, in no evident distress, black hair, brown eyes, right handed Head: Head normocephalic and atraumatic. Neck: Supple  Musculoskeletal: No obvious deformities or scoliosis Skin: No rashes or neurocutaneous lesions  Neurologic Exam Mental Status: Awake and fully alert.  Oriented to  place and time.  Recent and remote memory intact.  Attention span, concentration, and fund of knowledge appropriate.  Mood and affect appropriate. Cranial Nerves: Extraocular movements full without nystagmus. Hearing intact and symmetric to voice.  Facial sensation intact.  Face tongue, palate move normally and symmetrically.  Neck flexion and extension normal. Motor: Normal functional bulk, tone and strength Sensory: Intact to touch and temperature in all extremities.  Coordination: Coordination and balance is normal. Gait and Station: Arises from chair without difficulty.  Stance is normal. Gait demonstrates normal stride length and balance.   Impression: 1. Migraine without aura 2. History of migraine with aura 3. Episodic tension headache 4. History of DVT left lower extremity 5. History of left common peroneal neuropathy  Recommendations for plan of care: The patient's previous [facility] records were reviewed. Nicolas has neither had nor required imaging or lab studies since the last visit. She is a 24 year old young woman with history of migraine and tension  headaches. She is taking and tolerating Trokendi XR and has had 2 migraines in the last 6 weeks. I talked with Zaide about working on stress management and getting back into an exercise routine as these things are known to help with headaches. She will be moving to [facility] in the near future and when I see her in 3 months will likely plan for transfer of care to adult neurology practice at [facility]. I asked Vi to continue to keep headache diaries and will mail her some new calendars to use. She agreed with the plans made today.   The medication list was reviewed and reconciled. No changes were made in the prescribed medications today. A complete medication list was provided to the patient.  Allergies as of 04/29/2020      Reactions   Other Anaphylaxis   Any products that contain shellfish   Shellfish Allergy Anaphylaxis   Naproxen  Nausea And Vomiting      Medication List       Accurate as of April 29, 2020 11:47 AM. If you have any questions, ask your nurse or doctor.        acetaminophen 500 MG tablet Commonly known as: TYLENOL Take 1,000 mg by mouth every 6 (six) hours as needed for moderate pain.   EPINEPHrine 0.3 mg/0.3 mL Soaj injection Commonly known as: EpiPen 2-Pak Inject 0.3 mLs (0.3 mg total) into the muscle once.   ibuprofen 600 MG tablet Commonly known as: ADVIL Take 1 tablet (600 mg total) by mouth every 6 (six) hours as needed.   ipratropium 0.06 % nasal spray Commonly known as: ATROVENT USE 2 SPRAYS IN NOSTRIL(S) 4 TIMES A DAY   multivitamin with minerals Tabs tablet Take 1 tablet by mouth daily.   omeprazole 20 MG capsule Commonly known as: PRILOSEC TAKE 1 CAPSULE BY MOUTH EVERY DAY 30 MINUTES BEFORE MORNING MEAL   ondansetron 4 MG disintegrating tablet Commonly known as: Zofran ODT Place 1 tablet under the tongue at onset of nausea and vomiting. May repeat in 8 hours if needed.   OVER THE COUNTER MEDICATION Take 1 tablet by mouth daily.   SUMAtriptan 20 MG/ACT nasal spray Commonly known as: IMITREX ONE SPRAY INTO NOSE AT ONSET OF MIGRAINE, AND MAY REPEAT IN 2 HOURS IF HEADACHE PERSISTS   Topiramate ER 50 MG Cp24 Commonly known as: TROKENDI XR Take 2 capsules at bedtime      Total time spent with the patient was 15 minutes, of which 50% or more was spent in counseling and coordination of care.  Elveria Rising NP-C [facility] Ph. [phone] Fax [phone]

## 2020-04-29 NOTE — Progress Notes (Signed)
  This is a Pediatric Specialist E-Visit follow up consult provided via Caregility Mavi Un consented to an E-Visit consult today.  Location of patient: Jhanae is at home Location of provider: Elveria Rising, NP is in office Patient was referred by Sigmund Hazel, MD   The following participants were involved in this E-Visit: patient, CMA, provider  Chief Complain/ Reason for E-Visit today: Migraines Total time on call: 15 min Follow up: 3 months

## 2020-05-01 DIAGNOSIS — Z20822 Contact with and (suspected) exposure to covid-19: Secondary | ICD-10-CM | POA: Diagnosis not present

## 2020-05-02 DIAGNOSIS — J209 Acute bronchitis, unspecified: Secondary | ICD-10-CM | POA: Diagnosis not present

## 2020-05-03 ENCOUNTER — Encounter (INDEPENDENT_AMBULATORY_CARE_PROVIDER_SITE_OTHER): Payer: Self-pay | Admitting: Family

## 2020-05-03 DIAGNOSIS — M9903 Segmental and somatic dysfunction of lumbar region: Secondary | ICD-10-CM | POA: Diagnosis not present

## 2020-05-03 DIAGNOSIS — M9902 Segmental and somatic dysfunction of thoracic region: Secondary | ICD-10-CM | POA: Diagnosis not present

## 2020-05-03 DIAGNOSIS — M9901 Segmental and somatic dysfunction of cervical region: Secondary | ICD-10-CM | POA: Diagnosis not present

## 2020-05-03 DIAGNOSIS — M9905 Segmental and somatic dysfunction of pelvic region: Secondary | ICD-10-CM | POA: Diagnosis not present

## 2020-05-03 NOTE — Patient Instructions (Signed)
Thank you for meeting with me by video today.   Instructions for you until your next appointment are as follows: 1. Continue taking the Trokendi XR as prescribed.  2. Continue to keep headache diaries. I will mail you new ones for the rest of the year.  3. Try to work on stress management and getting into an exercise routine.  4. Please sign up for MyChart if you have not done so 5. Please plan to return for follow up in 3 months or sooner if needed.

## 2020-05-07 DIAGNOSIS — L700 Acne vulgaris: Secondary | ICD-10-CM | POA: Diagnosis not present

## 2020-05-07 DIAGNOSIS — L906 Striae atrophicae: Secondary | ICD-10-CM | POA: Diagnosis not present

## 2020-05-07 DIAGNOSIS — M79645 Pain in left finger(s): Secondary | ICD-10-CM | POA: Diagnosis not present

## 2020-05-09 DIAGNOSIS — J209 Acute bronchitis, unspecified: Secondary | ICD-10-CM | POA: Diagnosis not present

## 2020-05-09 DIAGNOSIS — R05 Cough: Secondary | ICD-10-CM | POA: Diagnosis not present

## 2020-05-14 DIAGNOSIS — M79645 Pain in left finger(s): Secondary | ICD-10-CM | POA: Diagnosis not present

## 2020-05-15 DIAGNOSIS — R110 Nausea: Secondary | ICD-10-CM | POA: Diagnosis not present

## 2020-05-15 DIAGNOSIS — K5900 Constipation, unspecified: Secondary | ICD-10-CM | POA: Diagnosis not present

## 2020-05-15 DIAGNOSIS — Z1159 Encounter for screening for other viral diseases: Secondary | ICD-10-CM | POA: Diagnosis not present

## 2020-05-17 ENCOUNTER — Other Ambulatory Visit: Payer: Self-pay | Admitting: Gastroenterology

## 2020-05-17 DIAGNOSIS — R110 Nausea: Secondary | ICD-10-CM | POA: Diagnosis not present

## 2020-05-17 DIAGNOSIS — Z807 Family history of other malignant neoplasms of lymphoid, hematopoietic and related tissues: Secondary | ICD-10-CM

## 2020-05-17 DIAGNOSIS — K2930 Chronic superficial gastritis without bleeding: Secondary | ICD-10-CM | POA: Diagnosis not present

## 2020-05-21 DIAGNOSIS — M9901 Segmental and somatic dysfunction of cervical region: Secondary | ICD-10-CM | POA: Diagnosis not present

## 2020-05-21 DIAGNOSIS — M9902 Segmental and somatic dysfunction of thoracic region: Secondary | ICD-10-CM | POA: Diagnosis not present

## 2020-05-21 DIAGNOSIS — M9905 Segmental and somatic dysfunction of pelvic region: Secondary | ICD-10-CM | POA: Diagnosis not present

## 2020-05-21 DIAGNOSIS — M9903 Segmental and somatic dysfunction of lumbar region: Secondary | ICD-10-CM | POA: Diagnosis not present

## 2020-05-24 ENCOUNTER — Ambulatory Visit
Admission: RE | Admit: 2020-05-24 | Discharge: 2020-05-24 | Disposition: A | Discharge status: Home / Self Care | Payer: BLUE CROSS/BLUE SHIELD | Source: Ambulatory Visit | Attending: Gastroenterology | Admitting: Gastroenterology

## 2020-05-24 DIAGNOSIS — Z975 Presence of (intrauterine) contraceptive device: Secondary | ICD-10-CM | POA: Diagnosis not present

## 2020-05-24 DIAGNOSIS — Z807 Family history of other malignant neoplasms of lymphoid, hematopoietic and related tissues: Secondary | ICD-10-CM

## 2020-05-24 DIAGNOSIS — K760 Fatty (change of) liver, not elsewhere classified: Secondary | ICD-10-CM | POA: Diagnosis not present

## 2020-05-24 DIAGNOSIS — K449 Diaphragmatic hernia without obstruction or gangrene: Secondary | ICD-10-CM | POA: Diagnosis not present

## 2020-05-24 DIAGNOSIS — R110 Nausea: Secondary | ICD-10-CM

## 2020-05-24 MED ORDER — IOPAMIDOL (ISOVUE-300) INJECTION 61%
100.0000 mL | Freq: Once | INTRAVENOUS | Status: AC | PRN
  Administered 2020-05-24: 100 mL via INTRAVENOUS

## 2020-06-14 DIAGNOSIS — J4540 Moderate persistent asthma, uncomplicated: Secondary | ICD-10-CM | POA: Diagnosis not present

## 2020-06-14 DIAGNOSIS — J209 Acute bronchitis, unspecified: Secondary | ICD-10-CM | POA: Diagnosis not present

## 2020-06-14 DIAGNOSIS — Z23 Encounter for immunization: Secondary | ICD-10-CM | POA: Diagnosis not present

## 2020-06-25 ENCOUNTER — Encounter (INDEPENDENT_AMBULATORY_CARE_PROVIDER_SITE_OTHER): Payer: Self-pay

## 2020-06-25 NOTE — Telephone Encounter (Signed)
For August there were 22 tension headaches, 6 of which required treatment. The remainder of the days were headache free. For September, there were 18 tension headaches, 8 of which required treatment. The remainder of the days were headache free. TG

## 2020-06-28 DIAGNOSIS — Z01419 Encounter for gynecological examination (general) (routine) without abnormal findings: Secondary | ICD-10-CM | POA: Diagnosis not present

## 2020-06-28 DIAGNOSIS — Z309 Encounter for contraceptive management, unspecified: Secondary | ICD-10-CM | POA: Diagnosis not present

## 2020-06-28 DIAGNOSIS — N871 Moderate cervical dysplasia: Secondary | ICD-10-CM | POA: Diagnosis not present

## 2020-07-06 DIAGNOSIS — B373 Candidiasis of vulva and vagina: Secondary | ICD-10-CM | POA: Diagnosis not present

## 2020-07-06 DIAGNOSIS — N898 Other specified noninflammatory disorders of vagina: Secondary | ICD-10-CM | POA: Diagnosis not present

## 2020-07-18 DIAGNOSIS — M7918 Myalgia, other site: Secondary | ICD-10-CM | POA: Diagnosis not present

## 2020-07-18 DIAGNOSIS — S46812A Strain of other muscles, fascia and tendons at shoulder and upper arm level, left arm, initial encounter: Secondary | ICD-10-CM | POA: Diagnosis not present

## 2020-07-26 DIAGNOSIS — R200 Anesthesia of skin: Secondary | ICD-10-CM | POA: Diagnosis not present

## 2020-07-29 ENCOUNTER — Encounter (INDEPENDENT_AMBULATORY_CARE_PROVIDER_SITE_OTHER): Payer: Self-pay

## 2020-07-29 DIAGNOSIS — G44219 Episodic tension-type headache, not intractable: Secondary | ICD-10-CM

## 2020-07-29 DIAGNOSIS — G43009 Migraine without aura, not intractable, without status migrainosus: Secondary | ICD-10-CM

## 2020-07-29 DIAGNOSIS — G43109 Migraine with aura, not intractable, without status migrainosus: Secondary | ICD-10-CM

## 2020-07-29 DIAGNOSIS — H5310 Unspecified subjective visual disturbances: Secondary | ICD-10-CM

## 2020-07-29 DIAGNOSIS — G43809 Other migraine, not intractable, without status migrainosus: Secondary | ICD-10-CM

## 2020-07-30 ENCOUNTER — Encounter (INDEPENDENT_AMBULATORY_CARE_PROVIDER_SITE_OTHER): Payer: Self-pay | Admitting: Family

## 2020-07-30 ENCOUNTER — Ambulatory Visit (INDEPENDENT_AMBULATORY_CARE_PROVIDER_SITE_OTHER): Payer: BLUE CROSS/BLUE SHIELD | Admitting: Family

## 2020-07-30 ENCOUNTER — Other Ambulatory Visit: Payer: Self-pay

## 2020-07-30 VITALS — BP 120/78 | HR 60 | Ht 67.5 in | Wt 189.0 lb

## 2020-07-30 DIAGNOSIS — G43009 Migraine without aura, not intractable, without status migrainosus: Secondary | ICD-10-CM | POA: Diagnosis not present

## 2020-07-30 DIAGNOSIS — M542 Cervicalgia: Secondary | ICD-10-CM

## 2020-07-30 DIAGNOSIS — Z86718 Personal history of other venous thrombosis and embolism: Secondary | ICD-10-CM | POA: Diagnosis not present

## 2020-07-30 DIAGNOSIS — G44219 Episodic tension-type headache, not intractable: Secondary | ICD-10-CM

## 2020-07-30 DIAGNOSIS — R202 Paresthesia of skin: Secondary | ICD-10-CM

## 2020-07-30 DIAGNOSIS — R200 Anesthesia of skin: Secondary | ICD-10-CM

## 2020-07-30 NOTE — Patient Instructions (Signed)
Thank you for coming in today. You likely have inflammation to the facial nerve causing the numbness near your mouth. This will continue to improve but will take time.   Instructions for you until your next appointment are as follows: 1. Try ice for 20 minutes about 3 times per day to your left neck and shoulder area. If you are having pain that keeps you from sleep at night, try heat for about 20 minutes 2. Considering massage to the left neck and shoulder area. This will help with healing and pain. 3. Call me or send a MyChart message next Tuesday November 23rd to let me know how you are doing.
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
## 2020-08-01 ENCOUNTER — Encounter (INDEPENDENT_AMBULATORY_CARE_PROVIDER_SITE_OTHER): Payer: Self-pay | Admitting: Family

## 2020-08-01 DIAGNOSIS — R202 Paresthesia of skin: Secondary | ICD-10-CM | POA: Insufficient documentation

## 2020-08-01 DIAGNOSIS — R200 Anesthesia of skin: Secondary | ICD-10-CM | POA: Insufficient documentation

## 2020-08-02 DIAGNOSIS — L700 Acne vulgaris: Secondary | ICD-10-CM | POA: Diagnosis not present

## 2020-08-12 NOTE — Addendum Note (Signed)
Addended by: Princella Ion on: 08/12/2020 09:35 AM   Modules accepted: Orders

## 2020-08-16 DIAGNOSIS — K760 Fatty (change of) liver, not elsewhere classified: Secondary | ICD-10-CM | POA: Diagnosis not present

## 2020-08-16 DIAGNOSIS — K219 Gastro-esophageal reflux disease without esophagitis: Secondary | ICD-10-CM | POA: Diagnosis not present

## 2020-09-05 ENCOUNTER — Telehealth (INDEPENDENT_AMBULATORY_CARE_PROVIDER_SITE_OTHER): Payer: Self-pay | Admitting: Family

## 2020-09-05 DIAGNOSIS — G43009 Migraine without aura, not intractable, without status migrainosus: Secondary | ICD-10-CM

## 2020-09-05 MED ORDER — TOPIRAMATE ER 50 MG PO CAP24: ORAL_CAPSULE | ORAL | 5 refills | Status: DC

## 2020-09-05 MED ORDER — SUMATRIPTAN 20 MG/ACT NA SOLN: NASAL | 5 refills | Status: DC

## 2020-09-05 NOTE — Telephone Encounter (Signed)
I received faxed headache diaries from [place]. For November, she had 17 tension headaches, 2 of which required treatment, 3 migraine headaches and the remainder of days were headache free. For December she had recorded days 1-16. On these days she had 7 tension headaches, 2 migraines and the remainder of days were headache free. I called Darlyne and she could not identify triggers for the November migraines but for December she believes they were related to her menstrual cycle. I reminded Demeshia to be sure to take the Topiramate ER every day, to drink plenty of water, to get plenty of sleep and to manage stress. I asked her to continue to send in headache diaries monthly. She agreed with these plans. TG

## 2020-10-07 DIAGNOSIS — Z03818 Encounter for observation for suspected exposure to other biological agents ruled out: Secondary | ICD-10-CM | POA: Diagnosis not present

## 2020-10-18 ENCOUNTER — Telehealth (INDEPENDENT_AMBULATORY_CARE_PROVIDER_SITE_OTHER): Payer: Self-pay | Admitting: Family

## 2020-10-18 DIAGNOSIS — Z03818 Encounter for observation for suspected exposure to other biological agents ruled out: Secondary | ICD-10-CM | POA: Diagnosis not present

## 2020-10-18 DIAGNOSIS — Z20822 Contact with and (suspected) exposure to covid-19: Secondary | ICD-10-CM | POA: Diagnosis not present

## 2020-10-18 NOTE — Telephone Encounter (Signed)
°  Who's calling (name and relationship to patient) :  Best contact number:  Provider they see:  Reason for call: Patient called she is having side effects to the medication Trokendi and she said before that she has been taking the generic Topiramate  Numbness in hands and feet  Memory loss Acid Reflux She said the abdominal pain nausea and constipation have gotten better since she saw th e GI doctor but the other side effects are really bothering her      PRESCRIPTION REFILL ONLY  Name of prescription:  Pharmacy:

## 2020-10-21 NOTE — Telephone Encounter (Signed)
I called and spoke to patient. She reports problems with memory, particularly at home more so than at work. I recommended that she drink more water and that she reduce the Topiramate ER dose down to 1 capsule at bedtime. I asked Dalaney to call me in 1 week to report on how she is doing. We may stop the Topiramate ER next week. TG

## 2020-10-25 DIAGNOSIS — M5459 Other low back pain: Secondary | ICD-10-CM | POA: Diagnosis not present

## 2020-10-25 DIAGNOSIS — M542 Cervicalgia: Secondary | ICD-10-CM | POA: Diagnosis not present

## 2020-10-25 DIAGNOSIS — M546 Pain in thoracic spine: Secondary | ICD-10-CM | POA: Diagnosis not present

## 2020-10-28 ENCOUNTER — Telehealth (INDEPENDENT_AMBULATORY_CARE_PROVIDER_SITE_OTHER): Payer: Self-pay | Admitting: Family

## 2020-10-28 NOTE — Telephone Encounter (Signed)
I called Melissa Boyd. She reports that she has had one migraine since reducing the Trokendi and the other headaches have been tension headaches. She continues to have problems with memory. I recommended stopping the Trokendi altogether and asked her to call me in 1 week to report. She may need neuropsychological testing if this continues. Melissa Boyd agreed with this plan. TG

## 2020-10-28 NOTE — Telephone Encounter (Signed)
Who's calling (name and relationship to patient) : Kaiyana Chavarria   Best contact number: [phone]  Provider they see: Elveria Rising  Reason for call: Patient is calling as instructed to talk with Elveria Rising about how she is doing.   Call ID:      PRESCRIPTION REFILL ONLY  Name of prescription:  Pharmacy:

## 2020-11-04 NOTE — Telephone Encounter (Signed)
Melissa Boyd called me to report that she has had one migraine since stopping Trokendi and other headaches were tension headaches. She has not noted significant improvement in memory, nor has she noted worsening. I recommended that she continue to not take Trokendi and invited her to call me if she has other questions or concerns. TG

## 2020-11-04 NOTE — Addendum Note (Signed)
Addended by: Princella Ion on: 11/04/2020 01:44 PM   Modules accepted: Orders

## 2020-11-07 ENCOUNTER — Encounter: Payer: Self-pay | Admitting: Neurology

## 2020-11-07 ENCOUNTER — Other Ambulatory Visit: Payer: Self-pay

## 2020-11-07 ENCOUNTER — Ambulatory Visit: Payer: BC Managed Care – PPO | Admitting: Neurology

## 2020-11-07 VITALS — BP 110/69 | HR 73 | Ht 67.5 in | Wt 180.0 lb

## 2020-11-07 DIAGNOSIS — G43009 Migraine without aura, not intractable, without status migrainosus: Secondary | ICD-10-CM

## 2020-11-07 NOTE — Patient Instructions (Addendum)
Tosymra: [facility] If needed, zonegran(Zonisamide) as migraine preventative Sumatriptan nasal spray What is this medicine? SUMATRIPTAN (soo ma TRIP tan) is used to treat migraines with or without aura. An aura is a strange feeling or visual disturbance that warns you of an attack. It is not used to prevent migraines. This medicine may be used for other purposes; ask your health care provider or pharmacist if you have questions. COMMON BRAND NAME(S): Imitrex, Tosymra What should I tell my health care provider before I take this medicine? They need to know if you have any of these conditions:  cigarette smoker  circulation problems in fingers and toes  diabetes  heart disease  high blood pressure  high cholesterol  history of irregular heartbeat  history of stroke  kidney disease  liver disease  stomach or intestine problems  an unusual or allergic reaction to sumatriptan, other medicines, foods, dyes, or preservatives  pregnant or trying to get pregnant  breast-feeding How should I use this medicine? This medicine is for use in the nose. Follow the directions on your product or prescription label. Do not use more often than directed. Make sure that you are using your nasal spray correctly. Ask your doctor or health care professional if you have any questions. Talk to your pediatrician regarding the use of this medicine in children. Special care may be needed. Overdosage: If you think you have taken too much of this medicine contact a poison control center or emergency room at once. NOTE: This medicine is only for you. Do not share this medicine with others. What if I miss a dose? This does not apply. This medicine is not for regular use. What may interact with this medicine? Do not take this medicine with any of the following medicines:  certain medicines for migraine headache like almotriptan, eletriptan, frovatriptan, naratriptan, rizatriptan, sumatriptan,  zolmitriptan  ergot alkaloids like dihydroergotamine, ergonovine, ergotamine, methylergonovine  MAOIs like Carbex, Eldepryl, Marplan, Nardil, and Parnate This medicine may also interact with the following medications:  certain medicines for depression, anxiety, or psychotic disorders This list may not describe all possible interactions. Give your health care provider a list of all the medicines, herbs, non-prescription drugs, or dietary supplements you use. Also tell them if you smoke, drink alcohol, or use illegal drugs. Some items may interact with your medicine. What should I watch for while using this medicine? Visit your healthcare professional for regular checks on your progress. Tell your healthcare professional if your symptoms do not start to get better or if they get worse. You may get drowsy or dizzy. Do not drive, use machinery, or do anything that needs mental alertness until you know how this medicine affects you. Do not stand up or sit up quickly, especially if you are an older patient. This reduces the risk of dizzy or fainting spells. Alcohol may interfere with the effect of this medicine. Tell your healthcare professional right away if you have any change in your eyesight. If you take migraine medicines for 10 or more days a month, your migraines may get worse. Keep a diary of headache days and medicine use. Contact your healthcare professional if your migraine attacks occur more frequently. What side effects may I notice from receiving this medicine? Side effects that you should report to your doctor or health care professional as soon as possible:  allergic reactions like skin rash, itching or hives, swelling of the face, lips, or tongue  changes in vision  chest pain or chest tightness  signs and symptoms of a dangerous change in heartbeat or heart rhythm like chest pain; dizziness; fast, irregular heartbeat; palpitations; feeling faint or lightheaded; falls; breathing  problems  signs and symptoms of a stroke like changes in vision; confusion; trouble speaking or understanding; severe headaches; sudden numbness or weakness of the face, arm or leg; trouble walking; dizziness; loss of balance or coordination  signs and symptoms of serotonin syndrome like irritable; confusion; diarrhea; fast or irregular heartbeat; muscle twitching; stiff muscles; trouble walking; sweating; high fever; seizures; chills; vomiting Side effects that usually do not require medical attention (report to your doctor or health care professional if they continue or are bothersome):  changes in taste  diarrhea  dizziness  drowsiness  dry mouth  headache  nausea, vomiting  pain, tingling, numbness in the hands or feet  stomach pain This list may not describe all possible side effects. Call your doctor for medical advice about side effects. You may report side effects to FDA at 1-800-FDA-1088. Where should I keep my medicine? Keep out of the reach of children. Store at room temperature between 2 and 30 degrees C (36 and 86 degrees F). Throw away any unused medicine after the expiration date. NOTE: This sheet is a summary. It may not cover all possible information. If you have questions about this medicine, talk to your doctor, pharmacist, or health care provider.  2021 Elsevier/Gold Standard (2018-03-15 15:08:07)   Analgesic Rebound Headache An analgesic rebound headache, sometimes called a medication overuse headache or a drug-induced headache, is a secondary disorder that is caused by the overuse of pain medicine (analgesic) to treat the original (primary) headache. Any type of primary headache can return as a rebound headache if a person regularly takes analgesics. The types of primary headaches that are commonly associated with rebound headaches include:  Migraines.  Headaches that are caused by tense muscles in the head and neck area (tension headaches).  Headaches  that develop and happen again on one side of the head and around the eye (cluster headaches). If rebound headaches continue, they can become long-term, daily headaches. What are the causes? This condition may be caused by frequent use of:  Over-the-counter medicines such as aspirin, ibuprofen, and acetaminophen.  Sinus-relief medicines and medicines that contain caffeine.  Narcotic pain medicines such as codeine and oxycodone.  Some prescription migraine medicines. What are the signs or symptoms? The symptoms of a rebound headache are the same as the symptoms of the original headache. Some of the symptoms of specific types of headaches include: Migraine headache  Pulsing or throbbing pain on one or both sides of the head.  Severe pain that interferes with daily activities.  Pain that gets worse with physical activity.  Nausea, vomiting, or both.  Pain and sensitivity with exposure to bright light, loud noises, or strong smells.  Visual changes.  Numbness of one or both arms. Tension headache  Pressure around the head.  Dull, aching head pain.  Pain felt over the front and sides of the head.  Tenderness in the muscles of the head, neck, and shoulders. Cluster headache  Severe pain that begins in or around one eye or temple.  Droopy or swollen eyelid, or redness and tearing in the eye on the same side as the pain.  One-sided head pain.  Nausea.  Runny nose.  Sweaty, pale facial skin.  Restlessness. How is this diagnosed? This condition is diagnosed by:  Reviewing your medical history. This includes the nature of your primary  headaches.  Reviewing the types of pain medicines that you have been using to treat your primary headaches and how often you take them. How is this treated? This condition may be treated or managed by:  Discontinuing frequent use of the analgesic medicine. Doing this may worsen your headaches at first, but the pain should eventually  become more manageable, less frequent, and less severe.  Seeing a headache specialist. He or she may be able to help you manage your headaches and help make sure there is not another cause of the headaches.  Using methods of stress relief, such as acupuncture, counseling, biofeedback, and massage. Talk with your health care provider about which methods might be good for you. Follow these instructions at home: Medicines  Take over-the-counter and prescription medicines only as told by your health care provider.  Stop the repeated use of pain medicine as told by your health care provider. Stopping can be difficult. Carefully follow instructions from your health care provider.   Lifestyle  Follow a regular sleep schedule. Do not vary the time that you go to bed or the amount that you sleep from day to day. It is important to stay on the same schedule to help prevent headaches. Get 7-9 hours of sleep each night, or the amount recommended by your health care provider.  Exercise regularly. Exercise for at least 30 minutes, 5 times each week.  Limit or manage stress. Consider stress-relief options such as acupuncture, counseling, biofeedback, and massage. Talk with your health care provider about which methods might be good for you.  Do not drink alcohol.  Do not use any products that contain nicotine or tobacco, such as cigarettes, e-cigarettes, and chewing tobacco. If you need help quitting, ask your health care provider.   General instructions  Avoid triggers that are known to cause your primary headaches.  Keep all follow-up visits as told by your health care provider. This is important. Contact a health care provider if:  You continue to experience headaches after following treatments that your health care provider recommended. Get help right away if you have:  New headache pain.  Headache pain that is different than what you have experienced in the past.  Numbness or tingling in  your arms or legs.  Changes in your speech or vision. Summary  An analgesic rebound headache, sometimes called a medication overuse headache or a drug-induced headache, is caused by the overuse of pain medicine (analgesic) to treat the original (primary) headache.  Any type of primary headache can return as a rebound headache if a person regularly takes analgesics. The types of primary headaches that are commonly associated with rebound headaches include migraines, tension headaches, and cluster headaches.  Analgesic rebound headaches can occur with frequent use of over-the-counter medicines and prescription medicines.  Treatment involves stopping the medicine that is being overused. This will improve headache frequency and severity. This information is not intended to replace advice given to you by your health care provider. Make sure you discuss any questions you have with your health care provider. Document Revised: 09/28/2019 Document Reviewed: 09/28/2019 Elsevier Patient Education  2021 [employer].

## 2020-11-07 NOTE — Progress Notes (Addendum)
[facility]    Provider:  Dr Lucia Gaskins Requesting Provider: Sigmund Hazel, MD Primary Care Provider:  Sigmund Hazel, MD  CC:  Migraines and tension headaches  HPI:  Melissa Boyd is a 25 y.o. female here as requested by Sigmund Hazel, MD for headaches and migraines. PMHx headache, DVT.  Patient reports daily headaches. Migraines with blurry vision, nausea, pulsating/pounding/throbbing, light and sound sensitivity, nausea and even vomiting, moving makes it worse, a dark room and sleep helps. Tension headaches are nagging she takes tylenol and was taking it every day. 1-2 migraines a month that are moderate to severe. She was on topiramate but stopped because of memory problems. Topamax helped a lot. She can wake up with the headache and can be random. She sometimes wakes up with migraines. They are not positional, nothing new. She takes OTC medications often, almost daily. We had a long discussion about options, the new medications available, other medications to try, lifestyle management. No other focal neurologic deficits, associated symptoms, inciting events or modifiable factors.  Medications tried that can be used in migraine management. Atenolol, topiramate, gabapentin, ibuprofen, meloxicam, methocarbamol, zofran, trokendi, sumatriptan nasal. Everything makes her queasy when she has a migraine cannot take oral medications. Amitriptyline.    Reviewed notes, labs and imaging from outside physicians, which showed:   MRI of the brain 12/2014: Normal; No acute intracranial abnormalities including mass lesion or mass effect, hydrocephalus, extra-axial fluid collection, midline shift, hemorrhage, or acute infarction, large ischemic events (personally reviewed images)   Review of Systems: Patient complains of symptoms per HPI as well as the following symptoms: Migraine. Pertinent negatives and positives per HPI. All others negative.   Social History   Socioeconomic History  .  Marital status: Single    Spouse name: Not on file  . Number of children: Not on file  . Years of education: Not on file  . Highest education level: Not on file  Occupational History  . Not on file  Tobacco Use  . Smoking status: Never Smoker  . Smokeless tobacco: Never Used  Substance and Sexual Activity  . Alcohol use: Not Currently    Comment: maybe once a month  . Drug use: No  . Sexual activity: Yes    Birth control/protection: Condom, I.U.D.  Other Topics Concern  . 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 She enjoys track and field.   Lives with her parents   Right handed   Caffeine: maybe once a week      Social Determinants of Health   Financial Resource Strain: Not on file  Food Insecurity: Not on file  Transportation Needs: Not on file  Physical Activity: Not on file  Stress: Not on file  Social Connections: Not on file  Intimate Partner Violence: Not on file    Family History  Problem Relation Age of Onset  . Anemia Father        low iron   . Alzheimer's disease Paternal Grandfather   . Migraines Brother   . Hypertension Maternal Aunt   . Migraines Cousin        Mother's 1st cousin; has them really bad   . Migraines Cousin     Past Medical History:  Diagnosis Date  . Allergy    Phreesia 04/29/2020  . DVT (deep venous thrombosis) (HCC)   . Headache, migraine   . Urticaria  Patient Active Problem List   Diagnosis Date Noted  . Numbness and tingling of right side of face 08/01/2020  . Palpitations 09/10/2015  . Idiopathic urticaria 07/01/2015  . Food allergy 07/01/2015  . Allergic rhinitis 06/28/2015  . Subjective visual disturbance of left eye 05/29/2015  . Neck pain on left side 05/27/2015  . History of deep vein thrombosis of lower extremity 11/05/2014  . 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  Awareness of heartbeats 08/07/2014  . Paroxysmal supraventricular tachycardia (HCC) 08/07/2014  . Breathlessness on exertion 08/07/2014  . Migraine with aura 12/08/2012  . Migraine variant 12/08/2012  . Migraine without aura and without status migrainosus, not intractable 12/08/2012  . Episodic tension type headache 12/08/2012  . Ovarian cyst 09/01/2012  . Migraines 04/18/2012  . Headache, migraine 04/18/2012    Past Surgical History:  Procedure Laterality Date  . SHOULDER SURGERY     left  . WISDOM TOOTH EXTRACTION      Current Outpatient Medications  Medication Sig Dispense Refill  . acetaminophen (TYLENOL) 500 MG tablet Take 1,000 mg by mouth every 6 (six) hours as needed for moderate pain.    Marland Kitchen EPINEPHrine (EPIPEN 2-PAK) 0.3 mg/0.3 mL IJ SOAJ injection Inject 0.3 mLs (0.3 mg total) into the muscle once. 4 Device 2  . ibuprofen (ADVIL,MOTRIN) 600 MG tablet Take 1 tablet (600 mg total) by mouth every 6 (six) hours as needed. 30 tablet 0  . Multiple Vitamin (MULTIVITAMIN WITH MINERALS) TABS tablet Take 1 tablet by mouth daily.    . ondansetron (ZOFRAN ODT) 4 MG disintegrating tablet Place 1 tablet under the tongue at onset of nausea and vomiting. May repeat in 8 hours if needed. 10 tablet 0  . OVER THE COUNTER MEDICATION Take 1 tablet by mouth daily.    . paragard intrauterine copper IUD IUD 1 each by Intrauterine route once.    . SUMAtriptan (TOSYMRA) 10 MG/ACT SOLN Place 1 spray into the nose every hour. Max 3 sprays per day. 6 each 11   No current facility-administered medications for this visit.    Allergies as of 11/07/2020 - Review Complete 11/07/2020  Allergen Reaction Noted  . Other Anaphylaxis 11/05/2014  . Shellfish allergy Anaphylaxis 04/14/2012  . Naproxen Nausea And Vomiting 10/28/2014    Vitals: BP 110/69 (BP Location: Left Arm, Patient Position: Sitting)   Pulse 73   Ht 5' 7.5" (1.715 m)   Wt 180 lb (81.6 kg)   BMI 27.78 kg/m  Last Weight:  Wt Readings  from Last 1 Encounters:  11/07/20 180 lb (81.6 kg)   Last Height:   Ht Readings from Last 1 Encounters:  11/07/20 5' 7.5" (1.715 m)     Physical exam: Exam: Gen: NAD, conversant, well nourised, obese, well groomed                     CV: RRR, no MRG. No Carotid Bruits. No peripheral edema, warm, nontender Eyes: Conjunctivae clear without exudates or hemorrhage  Neuro: Detailed Neurologic Exam  Speech:    Speech is normal; fluent and spontaneous with normal comprehension.  Cognition:    The patient is oriented to person, place, and time;     recent and remote memory intact;     language fluent;     normal attention, concentration,     fund of knowledge Cranial Nerves:    The pupils are equal, round, and reactive to light. The fundi are normal and spontaneous venous pulsations  are present. Visual fields are full to finger confrontation. Extraocular movements are intact. Trigeminal sensation is intact and the muscles of mastication are normal. The face is symmetric. The palate elevates in the midline. Hearing intact. Voice is normal. Shoulder shrug is normal. The tongue has normal motion without fasciculations.   Coordination:    Normal finger to nose and heel to shin. Normal rapid alternating movements.   Gait:    Heel-toe and tandem gait are normal.   Motor Observation:    No asymmetry, no atrophy, and no involuntary movements noted. Tone:    Normal muscle tone.    Posture:    Posture is normal. normal erect    Strength:    Strength is V/V in the upper and lower limbs.      Sensation: intact to LT     Reflex Exam:  DTR's:    Deep tendon reflexes in the upper and lower extremities are normal bilaterally.   Toes:    The toes are downgoing bilaterally.   Clonus:    Clonus is absent.    Assessment/Plan:  Patient has migraines with medication overuse.  We had a long discussion about options, the new medications available, other medications to try, lifestyle  management.   - If we were to start anything do Zonisamide because had such good results with topiramate and zonisamide has less memory issues but similar mechanism - Can also change to Tosymra - Discussed medication overuse  Orders Placed This Encounter  Procedures  . CBC with Differential/Platelets  . Comprehensive metabolic panel  . TSH   Meds ordered this encounter  Medications  . SUMAtriptan (TOSYMRA) 10 MG/ACT SOLN    Sig: Place 1 spray into the nose every hour. Max 3 sprays per day.    Dispense:  6 each    Refill:  11    Has tried sumatriptan oral, nasal, maxalt,zomig   Discussed: To prevent or relieve headaches, try the following: Cool Compress. Lie down and place a cool compress on your head.  Avoid headache triggers. If certain foods or odors seem to have triggered your migraines in the past, avoid them. A headache diary might help you identify triggers.  Include physical activity in your daily routine. Try a daily walk or other moderate aerobic exercise.  Manage stress. Find healthy ways to cope with the stressors, such as delegating tasks on your to-do list.  Practice relaxation techniques. Try deep breathing, yoga, massage and visualization.  Eat regularly. Eating regularly scheduled meals and maintaining a healthy diet might help prevent headaches. Also, drink plenty of fluids.  Follow a regular sleep schedule. Sleep deprivation might contribute to headaches Consider biofeedback. With this mind-body technique, you learn to control certain bodily functions -- such as muscle tension, heart rate and blood pressure -- to prevent headaches or reduce headache pain.    Proceed to emergency room if you experience new or worsening symptoms or symptoms do not resolve, if you have new neurologic symptoms or if headache is severe, or for any concerning symptom.   Provided education and documentation from American headache Society toolbox including articles on: chronic migraine  medication overuse headache, chronic migraines, prevention of migraines, behavioral and other nonpharmacologic treatments for headache.  Cc: Sigmund Hazel, MD,  Sigmund Hazel, MD  Naomie Dean, MD  [facility] [address] [place], [place] [address]  Phone [phone] Fax [phone]

## 2020-11-08 DIAGNOSIS — N760 Acute vaginitis: Secondary | ICD-10-CM | POA: Diagnosis not present

## 2020-11-08 DIAGNOSIS — B9689 Other specified bacterial agents as the cause of diseases classified elsewhere: Secondary | ICD-10-CM | POA: Diagnosis not present

## 2020-11-08 DIAGNOSIS — B373 Candidiasis of vulva and vagina: Secondary | ICD-10-CM | POA: Diagnosis not present

## 2020-11-09 ENCOUNTER — Encounter: Payer: Self-pay | Admitting: Neurology

## 2020-11-09 MED ORDER — TOSYMRA 10 MG/ACT NA SOLN: 1.0000 | NASAL | 11 refills | Status: DC

## 2020-11-13 ENCOUNTER — Other Ambulatory Visit: Payer: Self-pay

## 2020-11-13 ENCOUNTER — Ambulatory Visit: Payer: BC Managed Care – PPO | Admitting: Allergy

## 2020-11-13 ENCOUNTER — Encounter: Payer: Self-pay | Admitting: Allergy

## 2020-11-13 VITALS — BP 102/60 | HR 87 | Temp 97.6°F | Resp 18 | Ht 67.5 in | Wt 176.6 lb

## 2020-11-13 DIAGNOSIS — L503 Dermatographic urticaria: Secondary | ICD-10-CM | POA: Diagnosis not present

## 2020-11-13 DIAGNOSIS — T781XXD Other adverse food reactions, not elsewhere classified, subsequent encounter: Secondary | ICD-10-CM

## 2020-11-13 DIAGNOSIS — L509 Urticaria, unspecified: Secondary | ICD-10-CM | POA: Diagnosis not present

## 2020-11-13 DIAGNOSIS — J3089 Other allergic rhinitis: Secondary | ICD-10-CM | POA: Diagnosis not present

## 2020-11-13 DIAGNOSIS — T7819XD Other adverse food reactions, not elsewhere classified, subsequent encounter: Secondary | ICD-10-CM | POA: Insufficient documentation

## 2020-11-13 MED ORDER — FAMOTIDINE 20 MG PO TABS: 20.0000 mg | ORAL_TABLET | Freq: Two times a day (BID) | ORAL | 3 refills | Status: DC

## 2020-11-13 MED ORDER — EPINEPHRINE 0.3 MG/0.3ML IJ SOAJ: INTRAMUSCULAR | 3 refills | Status: AC

## 2020-11-13 NOTE — Assessment & Plan Note (Signed)
Being around the scent of shellfish causes throat closure, lip swelling and perioral pruritus. No recent testing. Tolerates finned fish.  Unable to skin test today due to recent antihistamine intake.  Get bloodwork.   Continue strict avoidance of shellfish.  I have prescribed epinephrine injectable and demonstrated proper use. For mild symptoms you can take over the counter antihistamines such as Benadryl and monitor symptoms closely. If symptoms worsen or if you have severe symptoms including breathing issues, throat closure, significant swelling, whole body hives, severe diarrhea and vomiting, lightheadedness then inject epinephrine and seek immediate medical care afterwards.  Food action plan given.

## 2020-11-13 NOTE — Assessment & Plan Note (Signed)
History of idiopathic urticaria and dermatographism. Symptoms stable until last week and noticed whole body pruritus with associated facial hives and periorbital edema. No triggers noted. Denies any changes in diet, meds, personal care products, recent infections. . Based on clinical history, she likely has chronic idiopathic urticaria. Discussed with patient, that urticaria is usually caused by release of histamine by cutaneous mast cells but sometimes it is non-histamine mediated. Explained that urticaria is not always associated with allergies. In most cases, the exact etiology for urticaria can not be established and it is considered idiopathic.  Start zyrtec (cetirizine) 10mg  once a day at night.   If hives are not controlled or causes drowsiness let know.  Start Pepcid (famotidine) 20mg  twice a day.  . Avoid the following potential triggers: alcohol, tight clothing, NSAIDs, hot showers and getting overheated. . Get bloodwork to rule out other etiologies.

## 2020-11-13 NOTE — Patient Instructions (Addendum)
Hives:  Start zyrtec (cetirizine) 10mg  once a day at night.   If hives are not controlled or causes drowsiness let know.  Start pepcid (famotidine) 20mg  twice a day.  . Avoid the following potential triggers: alcohol, tight clothing, NSAIDs, hot showers and getting overheated. . Get bloodwork:  o We are ordering labs, so please allow 1-2 weeks for the results to come back. o With the newly implemented Cures Act, the labs might be visible to you at the same time that they become visible to me. However, I will not address the results until all of the results are back, so please be patient.   Food:  Continue strict avoidance of shellfish.  I have prescribed epinephrine injectable and demonstrated proper use. For mild symptoms you can take over the counter antihistamines such as Benadryl and monitor symptoms closely. If symptoms worsen or if you have severe symptoms including breathing issues, throat closure, significant swelling, whole body hives, severe diarrhea and vomiting, lightheadedness then inject epinephrine and seek immediate medical care afterwards.  Food action plan given.  Follow up in 2 months or sooner if needed.   Skin care recommendations  Bath time: . Always use lukewarm water. AVOID very hot or cold water. [place] Keep bathing time to 5-10 minutes. . Do NOT use bubble bath. . Use a mild soap and use just enough to wash the dirty areas. . Do NOT scrub skin vigorously.  . After bathing, pat dry your skin with a towel. Do NOT rub or scrub the skin.  Moisturizers and prescriptions:  . ALWAYS apply moisturizers immediately after bathing (within 3 minutes). This helps to lock-in moisture. . Use the moisturizer several times a day over the whole body. summer moisturizers include: Aveeno, CeraVe, Cetaphil. Marland Kitchen winter moisturizers include: Aquaphor, Vaseline, Cerave, Cetaphil, Eucerin, Vanicream. . When using moisturizers along with medications, the moisturizer  should be applied about one hour after applying the medication to prevent diluting effect of the medication or moisturize around where you applied the medications. When not using medications, the moisturizer can be continued twice daily as maintenance.  Laundry and clothing: . Avoid laundry products with added color or perfumes. . Use unscented hypo-allergenic laundry products such as Tide free, Cheer free & gentle, and All free and clear.  . If the skin still seems dry or sensitive, you can try double-rinsing the clothes. . Avoid tight or scratchy clothing such as wool. . Do not use fabric softeners or dyer sheets.

## 2020-11-13 NOTE — Assessment & Plan Note (Signed)
History of rhino conjunctivitis symptoms and used to take antihistamines with good benefit. Lately has been asymptomatic but concerned if above itching/hive episode may be due to some environmental allergens.   Unable to skin test today due to recent antihistamine intake.  Get bloodwork.

## 2020-11-13 NOTE — Assessment & Plan Note (Signed)
Dermatographic on today's exam.  Take antihistamines as above.

## 2020-11-13 NOTE — Progress Notes (Signed)
New Patient Note  RE: Melissa Boyd Ciancio MRN: [id number] DOB: 1995-10-11 Date of Office Visit: 11/13/2020  Referring provider: Sigmund HazelMiller, Lisa, MD Primary care provider: Sigmund HazelMiller, Lisa, MD  Chief Complaint: Urticaria and Food Intolerance (Seafood )  History of Present Illness: I had the pleasure of seeing Melissa Boyd Pritz for initial evaluation at the [facility] on 11/13/2020. She is a 25 y.o. female, who is self-referred here for the evaluation of urticaria and food allergies. Last seen by Dr. Beaulah DinningBardelas in 2016 for idiopathic urticaria and food allergies.  Skin:  Patient started to have itching/little hives on 11/08/2020 after she went to her parent's house. Patient does not recall being outdoors that day.  She is having whole body pruritus. She also had some periorbital edema/itching.  Associated symptoms include: none. Suspected triggers are unknown. Denies any fevers, chills, changes in medications, foods, personal care products or recent infections. She has tried the following therapies: benadryl, Claritin with good benefit.  Previous work up includes: not recently. Previous history of rash/hives: yes.  Food: She reports food allergy to shellfish.  She reports throat closure, lip swelling and perioral pruritus after even with just smelling shellfish. She does have access to epinephrine autoinjector.  Past work up includes: not recently Dietary History: patient has been eating other foods including milk, eggs, peanut, treenuts, sesame, fish, soy, wheat, meats, fruits and vegetables.  She reports reading labels and avoiding shellfish in diet completely.   Assessment and Plan: [name] is a 25 y.o. female with: Urticaria History of idiopathic urticaria and dermatographism. Symptoms stable until last week and noticed whole body pruritus with associated facial hives and periorbital edema. No triggers noted. Denies any changes in diet, meds, personal care products, recent  infections. . Based on clinical history, she likely has chronic idiopathic urticaria. Discussed with patient, that urticaria is usually caused by release of histamine by cutaneous mast cells but sometimes it is non-histamine mediated. Explained that urticaria is not always associated with allergies. In most cases, the exact etiology for urticaria can not be established and it is considered idiopathic.  Start zyrtec (cetirizine) 10mg  once a day at night.   If hives are not controlled or causes drowsiness let us know.  Start Pepcid (famotidine) 20mg  twice a day.  . Avoid the following potential triggers: alcohol, tight clothing, NSAIDs, hot showers and getting overheated. . Get bloodwork to rule out other etiologies.  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  Take antihistamines as above.   Adverse reaction to food, subsequent encounter Being around the scent of shellfish causes throat closure, lip swelling and perioral pruritus. No recent testing. Tolerates finned fish.  Unable to skin test today due to recent antihistamine intake.  Get bloodwork.   Continue strict avoidance of shellfish.  I have prescribed epinephrine injectable and demonstrated proper use. For mild symptoms you can take over the counter antihistamines such as Benadryl and monitor symptoms closely. If symptoms worsen or if you have severe symptoms including breathing issues, throat closure, significant swelling, whole body hives, severe diarrhea and vomiting, lightheadedness then inject epinephrine and seek immediate medical care afterwards.  Food action plan given.   Other allergic rhinitis History of rhino conjunctivitis symptoms and used to take antihistamines with good benefit. Lately has been asymptomatic but concerned if above itching/hive episode may be due to some environmental allergens.   Unable to skin test today due to recent antihistamine intake.  Get bloodwork.     Return in about 2 months  (  around 01/13/2021).  Meds ordered this encounter  Medications  . famotidine (PEPCID) 20 MG tablet    Sig: Take 1 tablet (20 mg total) by mouth 2 (two) times daily.    Dispense:  60 tablet    Refill:  3  . EPINEPHrine (AUVI-Q) 0.3 mg/0.3 mL IJ SOAJ injection    Sig: Use as directed for severe allergic reactions    Dispense:  4 each    Refill:  3    Lab Orders     Alpha-Gal Panel     ANA w/Reflex     CBC with Differential/Platelet     Chronic Urticaria     Comprehensive metabolic panel     Tryptase     Thyroid Cascade Profile     Allergens w/Total IgE Area 2     Allergen Profile, Shellfish  Other allergy screening: Asthma: no Rhino conjunctivitis: no  Patient used to take antihistamines for this but has been asymptomatic the last few years.  Medication allergy: yes Hymenoptera allergy: no History of recurrent infections suggestive of immunodeficency: no  Diagnostics: Skin Testing: Deferred due to recent antihistamines use.  Past Medical History: Patient Active Problem List   Diagnosis Date Noted  . Urticaria 11/13/2020  . 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 Other allergic rhinitis 11/13/2020  . Adverse reaction to food, subsequent encounter 11/13/2020  . Numbness and tingling of right side of face 08/01/2020  . Palpitations 09/10/2015  . Idiopathic urticaria 07/01/2015  . Food allergy 07/01/2015  . Allergic rhinitis 06/28/2015  . Subjective visual disturbance of left eye 05/29/2015  . Neck pain on left side 05/27/2015  . History of deep vein thrombosis of lower extremity 11/05/2014  . 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 Awareness of heartbeats 08/07/2014  . Paroxysmal supraventricular tachycardia (HCC) 08/07/2014  . Breathlessness on exertion 08/07/2014  . Migraine with aura 12/08/2012  . Migraine variant 12/08/2012  . Migraine without aura and without status migrainosus, not intractable 12/08/2012  . Episodic tension type headache  12/08/2012  . Ovarian cyst 09/01/2012  . Migraines 04/18/2012  . Headache, migraine 04/18/2012   Past Medical History:  Diagnosis Date  . Allergy    [employer] 04/29/2020  . DVT (deep venous thrombosis) (HCC)   . Headache, migraine   . Urticaria    Past Surgical History: Past Surgical History:  Procedure Laterality Date  . SHOULDER SURGERY     left  . WISDOM TOOTH EXTRACTION     Medication List:  Current Outpatient Medications  Medication Sig Dispense Refill  . acetaminophen (TYLENOL) 500 MG tablet Take 1,000 mg by mouth every 6 (six) hours as needed for moderate pain.    Marland Kitchen albuterol (VENTOLIN HFA) 108 (90 Base) MCG/ACT inhaler albuterol sulfate HFA 90 mcg/actuation aerosol inhaler  USE 2 PUFFS EVERY 6 HOURS AS NEEDED    . clindamycin (CLEOCIN T) 1 % lotion clindamycin 1 % lotion  APPLY DAILY TO SKIN TO AFFECTED AREA EVERY DAY FOR 30 DAYS    . Elderberry 575 MG/5ML SYRP     . EPINEPHrine (AUVI-Q) 0.3 mg/0.3 mL IJ SOAJ injection Use as directed for severe allergic reactions 4 each 3  . famotidine (PEPCID) 20 MG tablet Take 1 tablet (20 mg total) by mouth 2 (two) times daily. 60 tablet 3  . ibuprofen (ADVIL,MOTRIN) 600 MG tablet Take 1 tablet (600 mg total) by mouth every 6 (six) hours as needed. 30 tablet 0  . Multiple Vitamin (MULTIVITAMIN WITH MINERALS) TABS tablet Take 1  tablet by mouth daily.    . Multiple Vitamins-Minerals (MULTIVITAMIN ADULT EXTRA C PO)     . ondansetron (ZOFRAN ODT) 4 MG disintegrating tablet Place 1 tablet under the tongue at onset of nausea and vomiting. May repeat in 8 hours if needed. 10 tablet 0  . OVER THE COUNTER MEDICATION Take 1 tablet by mouth daily.    . paragard intrauterine copper IUD IUD 1 each by Intrauterine route once.    . SUMAtriptan (TOSYMRA) 10 MG/ACT SOLN Place 1 spray into the nose every hour. Max 3 sprays per day. 6 each 11  . topiramate ER (QUDEXY XR) 50 MG CS24 sprinkle capsule topiramate XR 50 mg capsule sprinkle,extended release  24 hr  TAKE 2 CAPSULES BY MOUTH EVERY DAY AT BEDTIME    . tretinoin (RETIN-A) 0.05 % cream tretinoin 0.05 % topical cream  APPLY TO FACE ONCE A DAY IN THE EVENING     No current facility-administered medications for this visit.   Allergies: Allergies  Allergen Reactions  . Other Anaphylaxis    Any products that contain shellfish  . Shellfish Allergy Anaphylaxis  . Naproxen Nausea And Vomiting  . Amoxicillin Other (See Comments)  . Doxycycline Other (See Comments)   Social History: Social History   Socioeconomic History  . Marital status: Single    Spouse name: Not on file  . Number of children: Not on file  . Years of education: Not on file  . Highest education level: Not on file  Occupational History  . Not on file  Tobacco Use  . Smoking status: Never Smoker  . Smokeless tobacco: Never Used  Substance and Sexual Activity  . Alcohol use: Not Currently    Comment: maybe once a month  . Drug use: No  . Sexual activity: Yes    Birth control/protection: Condom, I.U.D.  Other Topics Concern  . 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 She enjoys track and field.   Lives with her parents   Right handed   Caffeine: maybe once a week      Social Determinants of Health   Financial Resource Strain: Not on file  Food Insecurity: Not on file  Transportation Needs: Not on file  Physical Activity: Not on file  Stress: Not on file  Social Connections: Not on file   Lives in a house. Smoking: denies Occupation: occupational therapist.   Environmental History: Water Damage/mildew in the house: no Carpet in the family room: yes Carpet in the bedroom: yes Heating: electric Cooling: central Pet: no  Family History: Family History  Problem Relation Age of Onset  . Anemia Father        low iron   . Alzheimer's disease Paternal Grandfather   . Migraines  Brother   . Hypertension Maternal Aunt   . Migraines Cousin        Mother's 1st cousin; has them really bad   . Migraines Cousin    Problem                               Relation Asthma                                   No  Eczema  No  Food allergy                          No   Allergic rhino conjunctivitis     No  Urticaria    Hives  Review of Systems  Constitutional: Negative for appetite change, chills, fever and unexpected weight change.  HENT: Negative for congestion and rhinorrhea.   Eyes: Positive for itching.  Respiratory: Negative for cough, chest tightness, shortness of breath and wheezing.   Cardiovascular: Negative for chest pain.  Gastrointestinal: Negative for abdominal pain.  Genitourinary: Negative for difficulty urinating.  Skin: Positive for rash.  Allergic/Immunologic: Positive for food allergies.  Neurological: Negative for headaches.   Objective: BP 102/60   Pulse 87   Temp 97.6 F (36.4 C) (Temporal)   Resp 18   Ht 5' 7.5" (1.715 m)   Wt 176 lb 9.6 oz (80.1 kg)   SpO2 98%   BMI 27.25 kg/m  Body mass index is 27.25 kg/m. Physical Exam Vitals and nursing note reviewed.  Constitutional:      Appearance: Normal appearance. She is well-developed.  HENT:     Head: Normocephalic and atraumatic.     Right Ear: Tympanic membrane and external ear normal.     Left Ear: Tympanic membrane and external ear normal.     Nose: Nose normal.     Mouth/Throat:     Mouth: Mucous membranes are moist.     Pharynx: Oropharynx is clear.  Eyes:     Conjunctiva/sclera: Conjunctivae normal.  Cardiovascular:     Rate and Rhythm: Normal rate and regular rhythm.     Heart sounds: Normal heart sounds. No murmur heard. No friction rub. No gallop.   Pulmonary:     Effort: Pulmonary effort is normal.     Breath sounds: Normal breath sounds. No wheezing, rhonchi or rales.  Musculoskeletal:     Cervical back: Neck supple.  Skin:    General:  Skin is warm.     Findings: Rash present.     Comments: +2 dermatographism  Neurological:     Mental Status: She is alert and oriented to person, place, and time.  Psychiatric:        Behavior: Behavior normal.    The plan was reviewed with the patient/family, and all questions/concerned were addressed.  It was my pleasure to see Lun today and participate in her care. Please feel free to contact me with any questions or concerns.  Sincerely,  Wyline Mood, DO Allergy & Immunology  [facility] office: [phone] [facility] [place] office: [phone]

## 2020-11-15 LAB — ALLERGEN PROFILE, SHELLFISH
Clam IgE: <0.1 kU/L
F023-IgE Crab: 1.55 kU/L — AB
F080-IgE Lobster: 1.42 kU/L — AB
F290-IgE Oyster: <0.1 kU/L
Scallop IgE: <0.1 kU/L
Shrimp IgE: 1.93 kU/L — AB

## 2020-11-22 DIAGNOSIS — M542 Cervicalgia: Secondary | ICD-10-CM | POA: Diagnosis not present

## 2020-11-22 LAB — CBC WITH DIFFERENTIAL/PLATELET
Basophils Absolute: 0 10*3/uL (ref 0.0–0.2)
Basos: 1 %
EOS (ABSOLUTE): 0 10*3/uL (ref 0.0–0.4)
Eos: 1 %
Hematocrit: 37.4 % (ref 34.0–46.6)
Hemoglobin: 13 g/dL (ref 11.1–15.9)
Immature Grans (Abs): 0 10*3/uL (ref 0.0–0.1)
Immature Granulocytes: 0 %
Lymphocytes Absolute: 2.4 10*3/uL (ref 0.7–3.1)
Lymphs: 53 %
MCH: 34 pg — ABNORMAL HIGH (ref 26.6–33.0)
MCHC: 34.8 g/dL (ref 31.5–35.7)
MCV: 98 fL — ABNORMAL HIGH (ref 79–97)
Monocytes Absolute: 0.4 10*3/uL (ref 0.1–0.9)
Monocytes: 9 %
Neutrophils Absolute: 1.6 10*3/uL (ref 1.4–7.0)
Neutrophils: 36 %
Platelets: 203 10*3/uL (ref 150–450)
RBC: 3.82 x10E6/uL (ref 3.77–5.28)
RDW: 11.3 % — ABNORMAL LOW (ref 11.7–15.4)
WBC: 4.4 10*3/uL (ref 3.4–10.8)

## 2020-11-22 LAB — ALPHA-GAL PANEL
Allergen Lamb IgE: <0.1 kU/L
Beef IgE: <0.1 kU/L
IgE (Immunoglobulin E), Serum: 73 IU/mL (ref 6–495)
O215-IgE Alpha-Gal: <0.1 kU/L
Pork IgE: <0.1 kU/L

## 2020-11-22 LAB — ALLERGENS W/TOTAL IGE AREA 2
Alternaria Alternata IgE: <0.1 kU/L
Aspergillus Fumigatus IgE: <0.1 kU/L
Bermuda Grass IgE: 0.34 kU/L — AB
Cat Dander IgE: <0.1 kU/L
Cedar, Mountain IgE: <0.1 kU/L
Cladosporium Herbarum IgE: <0.1 kU/L
Cockroach, German IgE: 1.03 kU/L — AB
Common Silver Birch IgE: <0.1 kU/L
Cottonwood IgE: 0.11 kU/L — AB
D Farinae IgE: 1.59 kU/L — AB
D Pteronyssinus IgE: 1.71 kU/L — AB
Dog Dander IgE: <0.1 kU/L
Elm, American IgE: <0.1 kU/L
Johnson Grass IgE: <0.1 kU/L
Maple/Box Elder IgE: <0.1 kU/L
Mouse Urine IgE: <0.1 kU/L
Oak, White IgE: 0.17 kU/L — AB
Pecan, Hickory IgE: <0.1 kU/L
Penicillium Chrysogen IgE: <0.1 kU/L
Pigweed, Rough IgE: 0.14 kU/L — AB
Ragweed, Short IgE: 0.89 kU/L — AB
Sheep Sorrel IgE Qn: 0.12 kU/L — AB
Timothy Grass IgE: <0.1 kU/L
White Mulberry IgE: <0.1 kU/L

## 2020-11-22 LAB — COMPREHENSIVE METABOLIC PANEL
ALT: 9 IU/L (ref 0–32)
AST: 13 IU/L (ref 0–40)
Albumin/Globulin Ratio: 1.4 (ref 1.2–2.2)
Albumin: 4.3 g/dL (ref 3.9–5.0)
Alkaline Phosphatase: 47 IU/L (ref 44–121)
BUN/Creatinine Ratio: 13 (ref 9–23)
BUN: 10 mg/dL (ref 6–20)
Bilirubin Total: 0.3 mg/dL (ref 0.0–1.2)
CO2: 24 mmol/L (ref 20–29)
Calcium: 9.4 mg/dL (ref 8.7–10.2)
Chloride: 99 mmol/L (ref 96–106)
Creatinine, Ser: 0.79 mg/dL (ref 0.57–1.00)
Globulin, Total: 3.1 g/dL (ref 1.5–4.5)
Glucose: 77 mg/dL (ref 65–99)
Potassium: 4.7 mmol/L (ref 3.5–5.2)
Sodium: 137 mmol/L (ref 134–144)
Total Protein: 7.4 g/dL (ref 6.0–8.5)
eGFR: 105 mL/min/{1.73_m2} (ref >59)

## 2020-11-22 LAB — CHRONIC URTICARIA: cu index: 3.8 (ref <10)

## 2020-11-22 LAB — THYROID CASCADE PROFILE: TSH: 1.43 u[IU]/mL (ref 0.450–4.500)

## 2020-11-22 LAB — TRYPTASE: Tryptase: 5.6 ug/L (ref 2.2–13.2)

## 2020-11-22 LAB — ANA W/REFLEX: Anti Nuclear Antibody (ANA): NEGATIVE

## 2020-11-27 DIAGNOSIS — M542 Cervicalgia: Secondary | ICD-10-CM | POA: Diagnosis not present

## 2020-12-06 ENCOUNTER — Encounter: Payer: Self-pay | Admitting: Adult Health

## 2020-12-09 NOTE — Telephone Encounter (Signed)
I have not seen this patient.

## 2020-12-14 ENCOUNTER — Other Ambulatory Visit: Payer: Self-pay | Admitting: Neurology

## 2020-12-14 DIAGNOSIS — G43009 Migraine without aura, not intractable, without status migrainosus: Secondary | ICD-10-CM

## 2020-12-14 MED ORDER — TOSYMRA 10 MG/ACT NA SOLN: 1.0000 | NASAL | 11 refills | Status: DC

## 2020-12-24 ENCOUNTER — Telehealth: Payer: Self-pay | Admitting: *Deleted

## 2020-12-24 NOTE — Telephone Encounter (Signed)
Completed Tosymra PA on Cover My Meds. Key: [id number]. Approved immediately by [employer]. CaseId:[id number];Status:Approved;Review Type:Prior Auth;Coverage Start Date:11/24/2020;Coverage End Date:12/24/2021;

## 2021-01-03 DIAGNOSIS — Z0000 Encounter for general adult medical examination without abnormal findings: Secondary | ICD-10-CM | POA: Diagnosis not present

## 2021-01-03 DIAGNOSIS — J309 Allergic rhinitis, unspecified: Secondary | ICD-10-CM | POA: Diagnosis not present

## 2021-01-03 DIAGNOSIS — N871 Moderate cervical dysplasia: Secondary | ICD-10-CM | POA: Diagnosis not present

## 2021-01-03 DIAGNOSIS — Z8349 Family history of other endocrine, nutritional and metabolic diseases: Secondary | ICD-10-CM | POA: Diagnosis not present

## 2021-01-10 DIAGNOSIS — M542 Cervicalgia: Secondary | ICD-10-CM | POA: Diagnosis not present

## 2021-01-19 ENCOUNTER — Encounter (INDEPENDENT_AMBULATORY_CARE_PROVIDER_SITE_OTHER): Payer: Self-pay

## 2021-01-22 ENCOUNTER — Ambulatory Visit: Payer: BC Managed Care – PPO | Admitting: Allergy

## 2021-01-25 DIAGNOSIS — M5412 Radiculopathy, cervical region: Secondary | ICD-10-CM | POA: Diagnosis not present

## 2021-02-06 DIAGNOSIS — J301 Allergic rhinitis due to pollen: Secondary | ICD-10-CM | POA: Diagnosis not present

## 2021-02-06 DIAGNOSIS — H1045 Other chronic allergic conjunctivitis: Secondary | ICD-10-CM | POA: Diagnosis not present

## 2021-02-06 DIAGNOSIS — J3089 Other allergic rhinitis: Secondary | ICD-10-CM | POA: Diagnosis not present

## 2021-02-06 DIAGNOSIS — L501 Idiopathic urticaria: Secondary | ICD-10-CM | POA: Diagnosis not present

## 2021-02-07 DIAGNOSIS — M5412 Radiculopathy, cervical region: Secondary | ICD-10-CM | POA: Diagnosis not present

## 2021-02-26 DIAGNOSIS — M5412 Radiculopathy, cervical region: Secondary | ICD-10-CM | POA: Diagnosis not present

## 2021-02-28 DIAGNOSIS — M5412 Radiculopathy, cervical region: Secondary | ICD-10-CM | POA: Diagnosis not present

## 2021-03-03 DIAGNOSIS — N898 Other specified noninflammatory disorders of vagina: Secondary | ICD-10-CM | POA: Diagnosis not present

## 2021-03-03 DIAGNOSIS — R319 Hematuria, unspecified: Secondary | ICD-10-CM | POA: Diagnosis not present

## 2021-03-12 ENCOUNTER — Ambulatory Visit: Payer: BC Managed Care – PPO | Admitting: Adult Health

## 2021-03-13 ENCOUNTER — Other Ambulatory Visit: Payer: Self-pay

## 2021-03-13 ENCOUNTER — Ambulatory Visit: Payer: BC Managed Care – PPO | Admitting: Adult Health

## 2021-03-13 VITALS — BP 112/72 | HR 71 | Ht 67.0 in | Wt 180.0 lb

## 2021-03-13 DIAGNOSIS — G43009 Migraine without aura, not intractable, without status migrainosus: Secondary | ICD-10-CM

## 2021-03-13 DIAGNOSIS — M5412 Radiculopathy, cervical region: Secondary | ICD-10-CM | POA: Diagnosis not present
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
## 2021-03-13 NOTE — Patient Instructions (Signed)
Your Plan:  Continue Sumatriptan nasal spray  If your symptoms worsen or you develop new symptoms please let us know.      Thank you for coming to see [place] at [facility]. I hope we have been able to provide you high quality care today.  You may receive a patient satisfaction survey over the next few weeks. We would appreciate your feedback and comments so that we may continue to improve ourselves and the health of our patients.

## 2021-03-24 DIAGNOSIS — M5412 Radiculopathy, cervical region: Secondary | ICD-10-CM | POA: Diagnosis not present

## 2021-03-26 DIAGNOSIS — M5412 Radiculopathy, cervical region: Secondary | ICD-10-CM | POA: Diagnosis not present

## 2021-04-02 DIAGNOSIS — M5412 Radiculopathy, cervical region: Secondary | ICD-10-CM | POA: Diagnosis not present

## 2021-04-04 DIAGNOSIS — M5412 Radiculopathy, cervical region: Secondary | ICD-10-CM | POA: Diagnosis not present

## 2021-04-09 DIAGNOSIS — M5412 Radiculopathy, cervical region: Secondary | ICD-10-CM | POA: Diagnosis not present

## 2021-04-16 DIAGNOSIS — M542 Cervicalgia: Secondary | ICD-10-CM | POA: Diagnosis not present

## 2021-04-21 DIAGNOSIS — M5412 Radiculopathy, cervical region: Secondary | ICD-10-CM | POA: Diagnosis not present

## 2021-04-23 DIAGNOSIS — M542 Cervicalgia: Secondary | ICD-10-CM | POA: Diagnosis not present

## 2021-04-28 DIAGNOSIS — M542 Cervicalgia: Secondary | ICD-10-CM | POA: Diagnosis not present

## 2021-05-07 DIAGNOSIS — M542 Cervicalgia: Secondary | ICD-10-CM | POA: Diagnosis not present

## 2021-05-23 DIAGNOSIS — M542 Cervicalgia: Secondary | ICD-10-CM | POA: Diagnosis not present

## 2021-05-28 DIAGNOSIS — M542 Cervicalgia: Secondary | ICD-10-CM | POA: Diagnosis not present

## 2021-05-30 DIAGNOSIS — M542 Cervicalgia: Secondary | ICD-10-CM | POA: Diagnosis not present

## 2021-06-04 DIAGNOSIS — M542 Cervicalgia: Secondary | ICD-10-CM | POA: Diagnosis not present

## 2021-06-05 DIAGNOSIS — H1045 Other chronic allergic conjunctivitis: Secondary | ICD-10-CM | POA: Diagnosis not present

## 2021-06-05 DIAGNOSIS — L501 Idiopathic urticaria: Secondary | ICD-10-CM | POA: Diagnosis not present

## 2021-06-05 DIAGNOSIS — Z91013 Allergy to seafood: Secondary | ICD-10-CM | POA: Diagnosis not present

## 2021-06-05 DIAGNOSIS — J301 Allergic rhinitis due to pollen: Secondary | ICD-10-CM | POA: Diagnosis not present

## 2021-06-06 DIAGNOSIS — M542 Cervicalgia: Secondary | ICD-10-CM | POA: Diagnosis not present

## 2021-06-11 DIAGNOSIS — M542 Cervicalgia: Secondary | ICD-10-CM | POA: Diagnosis not present

## 2021-06-13 DIAGNOSIS — M542 Cervicalgia: Secondary | ICD-10-CM | POA: Diagnosis not present

## 2021-06-18 DIAGNOSIS — M542 Cervicalgia: Secondary | ICD-10-CM | POA: Diagnosis not present

## 2021-06-20 DIAGNOSIS — M542 Cervicalgia: Secondary | ICD-10-CM | POA: Diagnosis not present

## 2021-06-23 DIAGNOSIS — M542 Cervicalgia: Secondary | ICD-10-CM | POA: Diagnosis not present

## 2021-06-27 DIAGNOSIS — M542 Cervicalgia: Secondary | ICD-10-CM | POA: Diagnosis not present

## 2021-06-30 DIAGNOSIS — M542 Cervicalgia: Secondary | ICD-10-CM | POA: Diagnosis not present

## 2021-07-04 DIAGNOSIS — Z01419 Encounter for gynecological examination (general) (routine) without abnormal findings: Secondary | ICD-10-CM | POA: Diagnosis not present

## 2021-07-04 DIAGNOSIS — M542 Cervicalgia: Secondary | ICD-10-CM | POA: Diagnosis not present

## 2021-07-07 DIAGNOSIS — M542 Cervicalgia: Secondary | ICD-10-CM | POA: Diagnosis not present

## 2021-07-11 DIAGNOSIS — M542 Cervicalgia: Secondary | ICD-10-CM | POA: Diagnosis not present

## 2021-07-14 DIAGNOSIS — M542 Cervicalgia: Secondary | ICD-10-CM | POA: Diagnosis not present

## 2021-07-28 DIAGNOSIS — M542 Cervicalgia: Secondary | ICD-10-CM | POA: Diagnosis not present

## 2021-08-04 DIAGNOSIS — M542 Cervicalgia: Secondary | ICD-10-CM | POA: Diagnosis not present

## 2021-08-04 DIAGNOSIS — H8113 Benign paroxysmal vertigo, bilateral: Secondary | ICD-10-CM | POA: Diagnosis not present

## 2021-08-25 DIAGNOSIS — M542 Cervicalgia: Secondary | ICD-10-CM | POA: Diagnosis not present

## 2021-08-29 ENCOUNTER — Encounter: Payer: Self-pay | Admitting: Neurology

## 2021-08-29 DIAGNOSIS — H8193 Unspecified disorder of vestibular function, bilateral: Secondary | ICD-10-CM | POA: Diagnosis not present

## 2021-08-29 DIAGNOSIS — H5789 Other specified disorders of eye and adnexa: Secondary | ICD-10-CM | POA: Diagnosis not present

## 2021-09-05 DIAGNOSIS — M542 Cervicalgia: Secondary | ICD-10-CM | POA: Diagnosis not present

## 2021-09-11 DIAGNOSIS — R42 Dizziness and giddiness: Secondary | ICD-10-CM | POA: Diagnosis not present

## 2021-09-11 DIAGNOSIS — R5383 Other fatigue: Secondary | ICD-10-CM | POA: Diagnosis not present

## 2021-09-12 DIAGNOSIS — M542 Cervicalgia: Secondary | ICD-10-CM | POA: Diagnosis not present

## 2021-09-16 ENCOUNTER — Other Ambulatory Visit: Payer: Self-pay

## 2021-09-16 ENCOUNTER — Ambulatory Visit
Admission: EM | Admit: 2021-09-16 | Discharge: 2021-09-16 | Disposition: A | Discharge status: Home / Self Care | Payer: BC Managed Care – PPO | Attending: Physician Assistant | Admitting: Physician Assistant

## 2021-09-16 ENCOUNTER — Encounter: Payer: Self-pay | Admitting: Emergency Medicine

## 2021-09-16 DIAGNOSIS — M542 Cervicalgia: Secondary | ICD-10-CM | POA: Diagnosis not present

## 2021-09-16 DIAGNOSIS — G8929 Other chronic pain: Secondary | ICD-10-CM | POA: Insufficient documentation

## 2021-09-16 DIAGNOSIS — R5383 Other fatigue: Secondary | ICD-10-CM | POA: Insufficient documentation

## 2021-09-16 DIAGNOSIS — R42 Dizziness and giddiness: Secondary | ICD-10-CM | POA: Diagnosis not present

## 2021-09-16 LAB — URINALYSIS, COMPLETE (UACMP) WITH MICROSCOPIC
Bacteria, UA: NONE SEEN
Bilirubin Urine: NEGATIVE
Glucose, UA: NEGATIVE mg/dL
Leukocytes,Ua: NEGATIVE
Nitrite: NEGATIVE
Protein, ur: NEGATIVE mg/dL
Specific Gravity, Urine: 1.015 (ref 1.005–1.030)
pH: 7 (ref 5.0–8.0)

## 2021-09-16 LAB — COMPREHENSIVE METABOLIC PANEL
ALT: 14 U/L (ref 0–44)
AST: 13 U/L — ABNORMAL LOW (ref 15–41)
Albumin: 3.8 g/dL (ref 3.5–5.0)
Alkaline Phosphatase: 46 U/L (ref 38–126)
Anion gap: 5 (ref 5–15)
BUN: 11 mg/dL (ref 6–20)
CO2: 26 mmol/L (ref 22–32)
Calcium: 8.7 mg/dL — ABNORMAL LOW (ref 8.9–10.3)
Chloride: 104 mmol/L (ref 98–111)
Creatinine, Ser: 0.76 mg/dL (ref 0.44–1.00)
GFR, Estimated: >60 mL/min (ref >60)
Glucose, Bld: 94 mg/dL (ref 70–99)
Potassium: 4.2 mmol/L (ref 3.5–5.1)
Sodium: 135 mmol/L (ref 135–145)
Total Bilirubin: 0.5 mg/dL (ref 0.3–1.2)
Total Protein: 7.3 g/dL (ref 6.5–8.1)

## 2021-09-16 LAB — CBC WITH DIFFERENTIAL/PLATELET
Abs Immature Granulocytes: 0.01 10*3/uL (ref 0.00–0.07)
Basophils Absolute: 0 10*3/uL (ref 0.0–0.1)
Basophils Relative: 1 %
Eosinophils Absolute: 0.1 10*3/uL (ref 0.0–0.5)
Eosinophils Relative: 1 %
HCT: 38.1 % (ref 36.0–46.0)
Hemoglobin: 13.1 g/dL (ref 12.0–15.0)
Immature Granulocytes: 0 %
Lymphocytes Relative: 40 %
Lymphs Abs: 2 10*3/uL (ref 0.7–4.0)
MCH: 33.9 pg (ref 26.0–34.0)
MCHC: 34.4 g/dL (ref 30.0–36.0)
MCV: 98.7 fL (ref 80.0–100.0)
Monocytes Absolute: 0.4 10*3/uL (ref 0.1–1.0)
Monocytes Relative: 8 %
Neutro Abs: 2.6 10*3/uL (ref 1.7–7.7)
Neutrophils Relative %: 50 %
Platelets: 184 10*3/uL (ref 150–400)
RBC: 3.86 MIL/uL — ABNORMAL LOW (ref 3.87–5.11)
RDW: 11.4 % — ABNORMAL LOW (ref 11.5–15.5)
WBC: 5.1 10*3/uL (ref 4.0–10.5)
nRBC: 0 % (ref 0.0–0.2)

## 2021-09-16 LAB — PREGNANCY, URINE: Preg Test, Ur: NEGATIVE

## 2021-09-16 NOTE — Discharge Instructions (Signed)
-  Labs look good today.  I will have to call to CMP results. - Increase rest and fluids. - Continue at home medications and PT/vestibular rehab. - Keep appointment with PCP in the next couple days. - Go to ER for any severely worsening symptoms.

## 2021-09-16 NOTE — ED Provider Notes (Signed)
[facility]    CSN: [id number] Arrival date & time: 09/16/21  1236      History   Chief Complaint Chief Complaint  Patient presents with   Fatigue   Nausea    HPI Melissa Boyd is a 26 y.o. female presenting for concerns of feeling more fatigued, nauseous and weak than normal.  Patient reports over the past couple of months she has been dealing with vestibular hypofunction intermittent vertigo.  Patient has been going to vestibular rehab.  She says her dizziness has improved over the past couple days she has felt more fatigued also reports feeling brain fog at times.  She explains she has had related to bulging disks in her neck over the past.  Patient has been to Ortho for this.  She had epidural injections for this.  Reports that her pain is controlled well going to physical therapy.  Patient is currently denying any fever, body aches, chest pain, shortness of breath, palpitations, abdominal pain, vomiting or diarrhea.  Patient reports staying hydrated well.  She does exercise but says she does not exercise to the level that she used to says she has decreased motivation to do so related to her neck injury.  Patient says she sleeps well at night.  Reports stress believes she handles it well. She has not idea why she would be more fatigued than normal.  Patient reports she has seen her PCP regarding this and did have labs drawn including CBC, lipid panel and thyroid panel.  Patient reports she has an appointment in 3 days but became very fatigued at work and wanted to be seen sooner.  Patient's last menstrual period was 1 month ago.  Denies getting any significant fatigue before her menstrual period.  Says she feels fatigue sometimes during her menstrual period.  Denies any significant vaginal bleeding at the time of her menstrual period.  No other complaints.  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   Other allergic rhinitis 11/13/2020   Adverse reaction to food, subsequent encounter 11/13/2020   Numbness and tingling of right side of face 08/01/2020   Palpitations 09/10/2015   Idiopathic urticaria 07/01/2015   Food allergy 07/01/2015   Allergic rhinitis 06/28/2015   Subjective visual disturbance of left eye 05/29/2015   Neck pain on left side 05/27/2015   History of deep vein thrombosis of lower extremity 11/05/2014   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   Awareness of heartbeats 08/07/2014   Paroxysmal supraventricular tachycardia ([place]) 08/07/2014   Breathlessness on exertion 08/07/2014   Migraine with aura 12/08/2012   Migraine variant 12/08/2012   Migraine without aura and without status migrainosus, not intractable 12/08/2012   Episodic tension type headache 12/08/2012   Ovarian cyst 09/01/2012   Migraines 04/18/2012   Headache, migraine 04/18/2012    Past Surgical History:  Procedure Laterality Date   SHOULDER SURGERY     left   WISDOM TOOTH EXTRACTION      OB History     Gravida  0   Para      Term      Preterm      AB      Living  SAB      IAB      Ectopic      Multiple      Live Births               Home Medications    Prior to Admission medications   Medication Sig Start Date End Date Taking? Authorizing Provider  [place] MG/5ML SYRP    Yes [provider]  meclizine (ANTIVERT) 25 MG tablet 1 tablet as needed 08/04/21  Yes [provider]  Multiple Vitamins-Minerals (MULTIVITAMIN ADULT EXTRA C PO)    Yes [provider]  paragard intrauterine copper IUD IUD 1 each by Intrauterine route once.   Yes [provider]  clindamycin (CLEOCIN T) 1 % lotion clindamycin 1 % lotion  APPLY DAILY TO SKIN TO AFFECTED AREA EVERY  DAY FOR 30 DAYS    [provider]  EPINEPHrine (AUVI-Q) 0.3 mg/0.3 mL IJ SOAJ injection Use as directed for severe allergic reactions 11/13/20   Garnet Sierras, DO  SUMAtriptan (TOSYMRA) 10 MG/ACT SOLN Place 1 spray into the nose every hour. Max 3 sprays per day. 12/14/20   Melvenia Beam, MD  tretinoin (RETIN-A) 0.05 % cream tretinoin 0.05 % topical cream  APPLY TO FACE ONCE A DAY IN THE EVENING    [provider]    Family History Family History  Problem Relation Age of Onset   Anemia Father        low iron    Alzheimer's disease Paternal Grandfather    Migraines Brother    Hypertension Maternal Aunt    Migraines Cousin        Mother's 1st cousin; has them really bad    Migraines Cousin     Social History Social History   Tobacco Use   Smoking status: Never   Smokeless tobacco: Never  Vaping Use   Vaping Use: Never used  Substance Use Topics   Alcohol use: Not Currently    Comment: maybe once a month   Drug use: No     Allergies   Other, Shellfish allergy, Naproxen, Amoxicillin, and Doxycycline   Review of Systems Review of Systems  Constitutional:  Positive for fatigue. Negative for fever.  HENT:  Negative for congestion.   Respiratory:  Negative for cough and shortness of breath.   Cardiovascular:  Negative for chest pain.  Gastrointestinal:  Positive for nausea. Negative for abdominal pain, diarrhea and vomiting.  Genitourinary:  Negative for decreased urine volume, dysuria, flank pain and menstrual problem.  Musculoskeletal:  Positive for neck pain (chronic).  Neurological:  Positive for dizziness and weakness. Negative for syncope and headaches.  Psychiatric/Behavioral:  Positive for dysphoric mood (sometimes). Negative for sleep disturbance. The patient is not nervous/anxious.     Physical Exam Triage Vital Signs ED Triage Vitals  Enc Vitals Group     BP 09/16/21 1357 118/74     Pulse Rate 09/16/21 1357 90     Resp 09/16/21 1357 18      Temp 09/16/21 1357 98.4 F (36.9 C)     Temp Source 09/16/21 1357 Oral     SpO2 09/16/21 1357 100 %     Weight 09/16/21 1355 179 lb 14.3 oz (81.6 kg)     Height 09/16/21 1355 5\' 7"  (1.702 m)     Head Circumference --      Peak Flow --      Pain Score 09/16/21 1354 0     Pain Loc --  Pain Edu? --      Excl. in [place]? --    Orthostatic VS for the past 24 hrs:  BP- Lying Pulse- Lying BP- Sitting Pulse- Sitting BP- Standing at 0 minutes Pulse- Standing at 0 minutes  09/16/21 1435 122/65 64 110/66 69 110/70 72    Updated Vital Signs BP 118/74 (BP Location: Right Arm)    Pulse 90    Temp 98.4 F (36.9 C) (Oral)    Resp 18    Ht 5\' 7"  (1.702 m)    Wt 179 lb 14.3 oz (81.6 kg)    LMP 08/16/2021 (Approximate)    SpO2 100%    BMI 28.18 kg/m      Physical Exam Vitals and nursing note reviewed.  Constitutional:      General: She is not in acute distress.    Appearance: Normal appearance. She is not ill-appearing or toxic-appearing.  HENT:     Head: Normocephalic and atraumatic.     Nose: Nose normal.     Mouth/Throat:     Mouth: Mucous membranes are moist.     Pharynx: Oropharynx is clear.  Eyes:     General: No scleral icterus.       Right eye: No discharge.        Left eye: No discharge.     Conjunctiva/sclera: Conjunctivae normal.  Cardiovascular:     Rate and Rhythm: Normal rate and regular rhythm.     Heart sounds: Normal heart sounds.  Pulmonary:     Effort: Pulmonary effort is normal. No respiratory distress.     Breath sounds: Normal breath sounds.  Abdominal:     Palpations: Abdomen is soft.     Tenderness: There is no abdominal tenderness.  Musculoskeletal:     Cervical back: Neck supple.  Skin:    General: Skin is dry.  Neurological:     General: No focal deficit present.     Mental Status: She is alert and oriented to person, place, and time. Mental status is at baseline.     Motor: No weakness.     Coordination: Coordination normal.     Gait: Gait normal.   Psychiatric:        Mood and Affect: Mood normal.        Behavior: Behavior normal.        Thought Content: Thought content normal.     UC Treatments / Results  Labs (all labs ordered are listed, but only abnormal results are displayed) Labs Reviewed  URINALYSIS, COMPLETE (UACMP) WITH MICROSCOPIC - Abnormal; Notable for the following components:      Result Value   Hgb urine dipstick TRACE (*)    Ketones, ur TRACE (*)    All other components within normal limits  CBC WITH DIFFERENTIAL/PLATELET - Abnormal; Notable for the following components:   RBC 3.86 (*)    RDW 11.4 (*)    All other components within normal limits  PREGNANCY, URINE  COMPREHENSIVE METABOLIC PANEL    EKG   Radiology No results found.  Procedures Procedures (including critical care time)  Medications Ordered in UC Medications - No data to display  Initial Impression / Assessment and Plan / UC Course  I have reviewed the triage vital signs and the nursing notes.  Pertinent labs & imaging results that were available during my care of the patient were reviewed by me and considered in my medical decision making (see chart for details).  26 year old female presenting for increased fatigue from baseline.  Patient has history of chronic neck pain, vertigo and vestibular hypofunction.  Patient gets physical therapy and vestibular rehab.  Patient reports she has an appoint with her PCP in 3 days to further discuss the fatigue.  Has had labs drawn already by PCP.  Vitals all normal and stable and she is overall well-appearing.  Exam is within normal limits today.  UA obtained today to assess for dehydration.  UA shows no signs of infection and normal specific gravity.  Pregnancy negative.  CBC shows normal hemoglobin level and normal WBC level.  CMP also obtained.  Unfortunately there is an issue with the machine at this time and lab technician is awaiting to work on the machine.  Advised patient I will have to  call her with the results.  Discussed with patient there are a multitude of different reasons why someone could be fatigued.  This could include chronic pain or could be related to her inner ear problem and dizziness.  Additionally we discussed that it could be related to her mood and possible depression.  Advised patient to keep her appointment with her PCP in a couple days to further address this.  They may order more labs.  Advised patient to continue exercising in the meantime and hydrating well.  Go to ED for any emergency signs or symptoms.  CMP unrevealing as to cause for fatigue. Results given to patient.    Final Clinical Impressions(s) / UC Diagnoses   Final diagnoses:  Other fatigue  Vertigo  Chronic neck pain     Discharge Instructions      -Labs look good today.  I will have to call to CMP results. - Increase rest and fluids. - Continue at home medications and PT/vestibular rehab. - Keep appointment with PCP in the next couple days. - Go to ER for any severely worsening symptoms.     ED Prescriptions   None    PDMP not reviewed this encounter.   Danton Clap, PA-C 09/16/21 1723

## 2021-09-16 NOTE — ED Triage Notes (Signed)
Pt c/o fatigue, nauseous, weakness. Started about a month ago. She states she was seen by her PCP and told it was vertigo and went to PT and was told it was vestibular  hypofunction. She states she had blood drawn by her pcp but has not gotten the results back. She states she has an appt with her pcp on Friday but she just felt too tired today.

## 2021-09-19 DIAGNOSIS — Z8669 Personal history of other diseases of the nervous system and sense organs: Secondary | ICD-10-CM | POA: Diagnosis not present

## 2021-09-19 DIAGNOSIS — R42 Dizziness and giddiness: Secondary | ICD-10-CM | POA: Diagnosis not present

## 2021-09-19 DIAGNOSIS — H5789 Other specified disorders of eye and adnexa: Secondary | ICD-10-CM | POA: Diagnosis not present

## 2021-09-19 DIAGNOSIS — R94113 Abnormal oculomotor study: Secondary | ICD-10-CM | POA: Diagnosis not present

## 2021-09-19 DIAGNOSIS — R5383 Other fatigue: Secondary | ICD-10-CM | POA: Diagnosis not present

## 2021-09-19 DIAGNOSIS — M542 Cervicalgia: Secondary | ICD-10-CM | POA: Diagnosis not present

## 2021-09-26 DIAGNOSIS — H5789 Other specified disorders of eye and adnexa: Secondary | ICD-10-CM | POA: Diagnosis not present

## 2021-10-03 DIAGNOSIS — H5789 Other specified disorders of eye and adnexa: Secondary | ICD-10-CM | POA: Diagnosis not present

## 2021-10-27 ENCOUNTER — Encounter: Payer: Self-pay | Admitting: Neurology

## 2021-10-27 ENCOUNTER — Other Ambulatory Visit: Payer: Self-pay | Admitting: Neurology

## 2021-10-27 ENCOUNTER — Ambulatory Visit: Payer: BC Managed Care – PPO | Admitting: Neurology

## 2021-10-27 VITALS — BP 118/79 | HR 80 | Ht 67.5 in | Wt 202.4 lb

## 2021-10-27 DIAGNOSIS — R42 Dizziness and giddiness: Secondary | ICD-10-CM | POA: Diagnosis not present

## 2021-10-27 DIAGNOSIS — G43009 Migraine without aura, not intractable, without status migrainosus: Secondary | ICD-10-CM | POA: Diagnosis not present

## 2021-10-27 DIAGNOSIS — H538 Other visual disturbances: Secondary | ICD-10-CM | POA: Diagnosis not present

## 2021-10-27 DIAGNOSIS — R2689 Other abnormalities of gait and mobility: Secondary | ICD-10-CM

## 2021-10-27 MED ORDER — TOSYMRA 10 MG/ACT NA SOLN: 1.0000 | NASAL | 11 refills | Status: DC

## 2021-10-27 MED ORDER — METHYLPREDNISOLONE 4 MG PO TBPK: ORAL_TABLET | ORAL | 1 refills | Status: DC

## 2021-10-27 NOTE — Patient Instructions (Addendum)
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 Nelva Bush again for re injections for the neck pain. Medrol dosepak will help.  - MRI of the brain with thin cuts through internal auditory canals  Vertigo Vertigo is the feeling that you or your surroundings are moving when they are not. This feeling can come and go at any time. Vertigo often goes away on its own. Vertigo can be dangerous if it occurs while you are doing something that could endanger yourself or others, such as driving or operating machinery. Your health care provider will do tests to try to determine the cause of your vertigo. Tests will also help your health care provider decide how best to treat your condition. Follow these instructions at home: Eating and drinking   Dehydration can make vertigo worse. Drink enough fluid to keep your urine pale yellow. Do not drink alcohol. Activity Return to your normal activities as told by your health care provider. Ask your health care provider what activities are safe for you. In the morning, first sit up on the side of the bed. When you feel okay, stand slowly while you hold onto something until you know that your balance is fine. Move slowly. Avoid sudden body or head movements or certain positions, as told by your health care provider. If you have trouble walking or keeping your balance, try using a cane for stability. If you feel dizzy or unstable, sit down right away. Avoid doing any tasks that would cause danger to you or others if vertigo occurs. Avoid  bending down if you feel dizzy. Place items in your home so that they are easy for you to reach without bending or leaning over. Do not drive or use machinery if you feel dizzy. General instructions Take over-the-counter and prescription medicines only as told by your health care provider. Keep all follow-up visits. This is important. Contact a health care provider if: Your medicines do not relieve your vertigo or they make it worse. Your condition gets worse or you develop new symptoms. You have a fever. You develop nausea or vomiting, or if nausea gets worse. Your family or friends notice any behavioral changes. You have numbness or a prickling and tingling sensation in part of your body. Get help right away if you: Are always dizzy or you faint. Develop severe headaches. Develop a stiff neck. Develop sensitivity to light. Have difficulty moving or speaking. Have weakness in your hands, arms, or legs. Have changes in your hearing or vision. These symptoms may represent a serious problem that is an emergency. Do not wait to see if the symptoms will go away. Get medical help right away. Call your local emergency services (911 in the [place] Do not drive yourself to the hospital. Summary Vertigo is the feeling that you or your surroundings are moving when they are not. Your health care provider will do tests to try to determine the cause of your vertigo. Follow instructions for home care. You may be told to avoid certain tasks, positions, or movements. Contact a health care provider if your medicines do  not relieve your symptoms, or if you have a fever, nausea, vomiting, or changes in behavior. Get help right away if you have severe headaches or difficulty speaking, or you develop hearing or vision problems. This information is not intended to replace advice given to you by your health care provider. Make sure you discuss any questions you have with your health care provider. Document  Revised: 07/31/2020 Document Reviewed: 07/31/2020 Elsevier Patient Education  [place].  Methylprednisolone Tablets What is this medication? METHYLPREDNISOLONE (meth ill pred NISS oh lone) treats many conditions such as asthma, allergic reactions, arthritis, inflammatory bowel diseases, adrenal, and blood or bone marrow disorders. It works by decreasing inflammation, slowing down an overactive immune system, or replacing cortisol normally made in the body. Cortisol is a hormone that plays an important role in how the body responds to stress, illness, and injury. It belongs to a group of medications called steroids. This medicine may be used for other purposes; ask your health care provider or pharmacist if you have questions. COMMON BRAND NAME(S): Medrol, Medrol Dosepak What should I tell my care team before I take this medication? They need to know if you have any of these conditions: Cushing's syndrome Eye disease, vision problems Diabetes Glaucoma Heart disease High blood pressure Infection (especially a virus infection such as chickenpox, cold sores, or herpes) Liver disease Mental illness Myasthenia gravis Osteoporosis Recently received or scheduled to receive a vaccine Seizures Stomach or intestine problems Thyroid disease An unusual or allergic reaction to lactose, methylprednisolone, other medications, foods, dyes, or preservatives Pregnant or trying to get pregnant Breast-feeding How should I use this medication? Take this medication by mouth with a glass of water. Follow the directions on the prescription label. Take this medication with food. If you are taking this medication once a day, take it in the morning. Do not take it more often than directed. Do not suddenly stop taking your medication because you may develop a severe reaction. Your care team will tell you how much medication to take. If your care team wants you to stop the medication, the dose may be slowly  lowered over time to avoid any side effects. Talk to your care team about the use of this medication in children. Special care may be needed. Overdosage: If you think you have taken too much of this medicine contact a poison control center or emergency room at once. NOTE: This medicine is only for you. Do not share this medicine with others. What if I miss a dose? If you miss a dose, take it as soon as you can. If it is almost time for your next dose, talk to your care team. You may need to miss a dose or take an extra dose. Do not take double or extra doses without advice. What may interact with this medication? Do not take this medication with any of the following: Alefacept Echinacea Live virus vaccines Metyrapone Mifepristone This medication may also interact with the following: Amphotericin B Aspirin and aspirin-like medications Certain antibiotics like erythromycin, clarithromycin, troleandomycin Certain medications for diabetes Certain medications for fungal infections like ketoconazole Certain medications for seizures like carbamazepine, phenobarbital, phenytoin Certain medications that treat or prevent blood clots like warfarin Cholestyramine Cyclosporine Digoxin Diuretics Female hormones, like estrogens and birth control pills Isoniazid NSAIDs, medications for pain inflammation, like ibuprofen or naproxen Other medications for myasthenia gravis Rifampin Vaccines This list may not describe all possible interactions. Give your health care provider a list of all the medicines,  herbs, non-prescription drugs, or dietary supplements you use. Also tell them if you smoke, drink alcohol, or use illegal drugs. Some items may interact with your medicine. What should I watch for while using this medication? Tell your care team if your symptoms do not start to get better or if they get worse. Do not stop taking except on your care team's advice. You may develop a severe reaction. Your  care team will tell you how much medication to take. This medication may increase your risk of getting an infection. Tell your care team if you are around anyone with measles or chickenpox, or if you develop sores or blisters that do not heal properly. This medication may increase blood sugar levels. Ask your care team if changes in diet or medications are needed if you have diabetes. Tell your care team right away if you have any change in your eyesight. Using this medication for a long time may increase your risk of low bone mass. Talk to your care team about bone health. What side effects may I notice from receiving this medication? Side effects that you should report to your care team as soon as possible: Allergic reactions--skin rash, itching, hives, swelling of the face, lips, tongue, or throat Cushing syndrome--increased fat around the midsection, upper back, neck, or face, pink or purple stretch marks on the skin, thinning, fragile skin that easily bruises, unexpected hair growth High blood sugar (hyperglycemia)--increased thirst or amount of urine, unusual weakness or fatigue, blurry vision Increase in blood pressure Infection--fever, chills, cough, sore throat, wounds that don't heal, pain or trouble when passing urine, general feeling of discomfort or being unwell Low adrenal gland function--nausea, vomiting, loss of appetite, unusual weakness or fatigue, dizziness Mood and behavior changes--anxiety, nervousness, confusion, hallucinations, irritability, hostility, thoughts of suicide or self-harm, worsening mood, feelings of depression Stomach bleeding--bloody or black, tar-like stools, vomiting blood or brown material that looks like coffee grounds Swelling of the ankles, hands, or feet Side effects that usually do not require medical attention (report to your care team if they continue or are bothersome): Acne General discomfort and fatigue Headache Increase in  appetite Nausea Trouble sleeping Weight gain This list may not describe all possible side effects. Call your doctor for medical advice about side effects. You may report side effects to FDA at 1-800-FDA-1088. Where should I keep my medication? Keep out of the reach of children and pets. Store at room temperature between 20 and 25 degrees C (68 and 77 degrees F). Throw away any unused medication after the expiration date. NOTE: This sheet is a summary. It may not cover all possible information. If you have questions about this medicine, talk to your doctor, pharmacist, or health care provider.  2022 Elsevier/Gold Standard (2020-11-26 00:00:00)

## 2021-10-27 NOTE — Progress Notes (Addendum)
[facility]    Provider:  Dr Jaynee Eagles Requesting Provider: Holland Commons, [place] Primary Care Provider:  Kathyrn Lass, MD  CC:  Dizziness   HPI 10/27/2021: Seen her in the past for migraines. In November she started having dizziness, turning head feeling dizzy, room spinning, laying in bed was bad, she wouldn't fall but it made her feel sick and nauseated, turning and bending over made it worse. She had vestibula therapy, would happen randomly throughout the day. She never had dizziness with her migraines and has not had a migraine headache since prior to this dizziness started. Epley maneuvers did not help. Last spell was in January, otherwise it has improved. No hearing changes. She still has fatigue but that's improving as well. She has a lot of neck pain and feels like that is associated. She had a more recent MRI at [facility] with some c6/c7 degenerative changes.MRi brain in 2016 was normal but given vision changes, vertigo, dizziness, nausea may want to repeat MRI brain to rule out any neurologic etiologies. No inciting events, no new medication, no infections, no head trauma, no allergies. Certain positions in the bed would make the room spinin the past, now she just doesn't feel well she is tired. Blurry vision/tired eyes, needs to close eyes, no headaches, no double vision, no pain or pressure in the ears. Has neck pain, she went to [facility] and had injections would go back, she had massage therapy.   2016: IMPRESSION: Normal MRI appearance of the brain and cervical spine. But did show muscle spasms(straightening of the cervical lordosis)   09/16/2021: cbc/cmp unrenarkable,   09/02/2021 Lyme negative, crp/esr nml, rpr NR, ANA panel nml, B12 1425 nml, TSh nml, ftsh nml  Patient complains of symptoms per HPI as well as the following symptoms: fatigue . Pertinent negatives and positives per HPI. All others negative   HPI 11/07/2020:  Allanna Bresee is a 26 y.o.  female here as requested by Holland Commons, FNP for headaches and migraines. PMHx headache, DVT.  Patient reports daily headaches. Migraines with blurry vision, nausea, pulsating/pounding/throbbing, light and sound sensitivity, nausea and even vomiting, moving makes it worse, a dark room and sleep helps. Tension headaches are nagging she takes tylenol and was taking it every day. 1-2 migraines a month that are moderate to severe. She was on topiramate but stopped because of memory problems. Topamax helped a lot. She can wake up with the headache and can be random. She sometimes wakes up with migraines. They are not positional, nothing new. She takes OTC medications often, almost daily. We had a long discussion about options, the new medications available, other medications to try, lifestyle management. No other focal neurologic deficits, associated symptoms, inciting events or modifiable factors.  Medications tried that can be used in migraine management. Atenolol, topiramate, gabapentin, ibuprofen, meloxicam, methocarbamol, zofran, trokendi, sumatriptan nasal. Everything makes her queasy when she has a migraine cannot take oral medications. Amitriptyline.    Reviewed notes, labs and imaging from outside physicians, which showed:   MRI of the brain 12/2014: Normal; No acute intracranial abnormalities including mass lesion or mass effect, hydrocephalus, extra-axial fluid collection, midline shift, hemorrhage, or acute infarction, large ischemic events (personally reviewed images)   Review of Systems: Patient complains of symptoms per HPI as well as the following symptoms: Migraine. Pertinent negatives and positives per HPI. All others negative.   Social History   Socioeconomic History   Marital status: Single    Spouse name: Not  on file   Number of children: Not on file   Years of education: Not on file   Highest education level: Not on file  Occupational History   Not on file  Tobacco  Use   Smoking status: Never   Smokeless tobacco: Never  Vaping Use   Vaping Use: Never used  Substance and Sexual Activity   Alcohol use: Yes    Comment: maybe once a month   Drug use: No   Sexual activity: Yes    Birth control/protection: Condom, I.U.D.  Other Topics Concern   Not on file  Social History Narrative   Aldona graduated from [employer], in grad school at [employer]. Graduated OT school, working at [employer]. She enjoys track and field.   Lives in [place].    Right handed   Caffeine: maybe once a week. [place])      Social Determinants of Health   Financial Resource Strain: Not on file  Food Insecurity: Not on file  Transportation Needs: Not on file  Physical Activity: Not on file  Stress: Not on file  Social Connections: Not on file  Intimate Partner Violence: Not on file    Family History  Problem Relation Age of Onset   Anemia Father        low iron    Alzheimer's disease Paternal Grandfather    Migraines Brother    Hypertension Maternal Aunt    Migraines Cousin        Mother's 1st cousin; has them really bad    Migraines Cousin     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) 08/07/2014   Breathlessness on exertion 08/07/2014   Migraine with aura 12/08/2012   Migraine variant 12/08/2012   Migraine without aura and without status migrainosus, not intractable 12/08/2012   Episodic tension type headache 12/08/2012   Ovarian cyst 09/01/2012   Migraines 04/18/2012   Headache, migraine 04/18/2012    Past Surgical History:  Procedure Laterality Date   SHOULDER SURGERY     left   WISDOM TOOTH EXTRACTION      Current Outpatient Medications  Medication Sig Dispense Refill   clindamycin (CLEOCIN T) 1 % lotion clindamycin 1 % lotion  APPLY DAILY TO SKIN TO AFFECTED AREA EVERY DAY FOR 30 DAYS     Elderberry 575 MG/5ML SYRP      EPINEPHrine (AUVI-Q) 0.3 mg/0.3 mL IJ SOAJ injection Use as directed for severe allergic reactions 4 each 3   meclizine (ANTIVERT) 25 MG tablet 1 tablet as needed     methylPREDNISolone (MEDROL DOSEPAK) 4 MG TBPK tablet Take pills daily all together with food. Take the first dose (6  pills) as soon as possible. Take the rest each morning. For 6 days total 6-5-4-3-2-1. 21 tablet 1   Multiple Vitamins-Minerals (MULTIVITAMIN ADULT EXTRA C PO)      paragard intrauterine copper IUD IUD 1 each by Intrauterine route once.     tretinoin (RETIN-A) 0.05 % cream tretinoin 0.05 % topical cream  APPLY TO FACE ONCE A DAY IN THE EVENING     SUMAtriptan (TOSYMRA) 10 MG/ACT SOLN Place 1 spray into the nose every hour. Max 3 sprays per day. 6 each 11   No current facility-administered medications for this visit.    Allergies as of 10/27/2021 - Review Complete 10/27/2021  Allergen Reaction Noted   Other Anaphylaxis 11/05/2014   Shellfish allergy Anaphylaxis 04/14/2012   Naproxen Nausea And Vomiting 10/28/2014   Amoxicillin Other (See Comments) 11/08/2020   Doxycycline Other (See Comments) 11/08/2020    Vitals: BP 118/79    Pulse 80    Ht 5' 7.5" (1.715 m)    Wt 202 lb 6.4 oz (91.8 kg)    BMI 31.23 kg/m   Last Weight:  Wt Readings from Last 1 Encounters:  10/27/21 202 lb 6.4 oz (91.8 kg)   Last Height:   Ht Readings from Last 1 Encounters:  10/27/21 5' 7.5" (1.715 m)   Physical exam: Exam: Gen: NAD, conversant, well nourised, well groomed                     CV: RRR, no MRG. No Carotid Bruits. No peripheral edema, warm, nontender Eyes: Conjunctivae clear without exudates or hemorrhage  Neuro: Detailed Neurologic Exam  Speech:    Speech is normal; fluent and spontaneous with normal comprehension.  Cognition:    The patient is oriented to person, place, and time;     recent and remote memory intact;     language fluent;     normal attention, concentration,     fund of knowledge Cranial Nerves:    The pupils are equal, round, and reactive to light. The fundi are normal and spontaneous venous pulsations are present. Visual fields are full to finger confrontation. Extraocular movements are intact. Trigeminal sensation is intact and the muscles of mastication are normal. The face is symmetric. The palate elevates in the midline. Hearing intact. Voice is normal. Shoulder shrug is normal. The tongue has normal motion without fasciculations.   Coordination:    Normal    Gait:     normal.   Motor Observation:    No asymmetry, no atrophy, and no involuntary movements noted. Tone:    Normal muscle tone.    Posture:    Posture is normal. normal erect    Strength:    Strength is V/V in the upper and lower limbs.      Sensation: intact to LT     Reflex Exam:  DTR's:    Deep tendon reflexes in the upper and lower extremities are normal bilaterally.   Toes:    The toes are downgoing bilaterally.   Clonus:    Clonus is absent.     Assessment/Plan: Patient with hx of migraines who has had vertigo/dizziness for months(dizziness NEVER associated with migraines), went to vestibular rehab > 6 weeks, still ongoing symptoms, tried meclizine and other medications for months.   A  lot of different reasons for dizziness/vertigo:  - I do not think this is vestibular migraines. It is true patients can have vertigo/dizziness without head pain but usually they in vestibular migraines they have also  had it with their migraines/head pain as well. Patient has never had the dizziness/vertigo associated with the migraines. 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 Nelva Bush again for re injections for the neck pain. Medrol dosepak will help.  - MRI of the brain with thin cuts through internal auditory canals to look for lesions, schwannoma, fluid/infection mastoid cells, stroke, demyelination(multiple sclerosis) or other etiology.   Meds ordered this encounter  Medications   methylPREDNISolone (MEDROL DOSEPAK) 4 MG TBPK tablet    Sig: Take pills daily all together with food. Take the first dose (6 pills) as soon as possible. Take the rest each morning. For 6 days total 6-5-4-3-2-1.    Dispense:  21 tablet    Refill:  1   SUMAtriptan (TOSYMRA) 10 MG/ACT SOLN    Sig: Place 1 spray into the nose every hour. Max 3 sprays per day.    Dispense:  6 each    Refill:  11    Has tried sumatriptan oral, nasal, maxalt,zomig. Has a copay card.   Orders Placed This Encounter  Procedures   MR BRAIN W WO CONTRAST    Cc: Holland Commons, FNP,  Kathyrn Lass, MD  Sarina Ill, MD  [facility] [address] [place], [place] [address]  Phone [phone] Fax [phone]  I spent over 44 minutes of face-to-face and non-face-to-face time with patient on the  1.  Vertigo   2. Dizziness   3. Imbalance   4. Blurry vision   5. Migraine without aura and without status migrainosus, not intractable    diagnosis.  This included previsit chart review, lab review, study review, order entry, electronic health record documentation, patient education on the different diagnostic and therapeutic options, counseling and coordination of care, risks and benefits of management, compliance, or risk factor reduction

## 2021-11-03 ENCOUNTER — Telehealth: Payer: Self-pay | Admitting: Neurology

## 2021-11-03 NOTE — Telephone Encounter (Signed)
MR Brain w/wo contras Dr. Valaria Good Berkley Harvey: [id number] (exp. 11/03/21 to 12/02/21).  Patient is scheduled at [facility] for 11/11/21.   She informed me she is claustrophobic and would like something to help her, she is aware to have a driver.

## 2021-11-04 ENCOUNTER — Other Ambulatory Visit: Payer: Self-pay | Admitting: Neurology

## 2021-11-04 MED ORDER — ALPRAZOLAM 0.25 MG PO TABS: ORAL_TABLET | ORAL | 0 refills | Status: DC

## 2021-11-10 ENCOUNTER — Institutional Professional Consult (permissible substitution): Payer: BC Managed Care – PPO | Admitting: Neurology

## 2021-11-11 ENCOUNTER — Ambulatory Visit (INDEPENDENT_AMBULATORY_CARE_PROVIDER_SITE_OTHER): Payer: BC Managed Care – PPO

## 2021-11-11 ENCOUNTER — Encounter: Payer: Self-pay | Admitting: Neurology

## 2021-11-11 DIAGNOSIS — R2689 Other abnormalities of gait and mobility: Secondary | ICD-10-CM

## 2021-11-11 DIAGNOSIS — H538 Other visual disturbances: Secondary | ICD-10-CM | POA: Diagnosis not present

## 2021-11-11 DIAGNOSIS — R42 Dizziness and giddiness: Secondary | ICD-10-CM

## 2021-11-11 MED ORDER — GADOBENATE DIMEGLUMINE 529 MG/ML IV SOLN
20.0000 mL | Freq: Once | INTRAVENOUS | Status: AC | PRN
  Administered 2021-11-11: 20 mL via INTRAVENOUS

## 2021-11-12 ENCOUNTER — Other Ambulatory Visit: Payer: Self-pay | Admitting: Neurology

## 2021-11-12 DIAGNOSIS — R42 Dizziness and giddiness: Secondary | ICD-10-CM

## 2021-11-12 MED ORDER — NORTRIPTYLINE HCL 10 MG PO CAPS: 10.0000 mg | ORAL_CAPSULE | Freq: Every day | ORAL | 3 refills | Status: DC

## 2021-11-17 ENCOUNTER — Telehealth: Payer: Self-pay | Admitting: Neurology

## 2021-11-17 DIAGNOSIS — M5412 Radiculopathy, cervical region: Secondary | ICD-10-CM | POA: Diagnosis not present

## 2021-11-17 NOTE — Telephone Encounter (Signed)
Sent to Dr. Margarito Courser with [facility] ph # [phone] ?

## 2021-11-26 ENCOUNTER — Encounter: Payer: Self-pay | Admitting: *Deleted

## 2021-11-28 ENCOUNTER — Other Ambulatory Visit: Payer: Self-pay

## 2021-11-28 ENCOUNTER — Encounter: Payer: Self-pay | Admitting: Physical Therapy

## 2021-11-28 ENCOUNTER — Ambulatory Visit: Payer: BC Managed Care – PPO | Attending: Neurology | Admitting: Physical Therapy

## 2021-11-28 DIAGNOSIS — R42 Dizziness and giddiness: Secondary | ICD-10-CM | POA: Diagnosis not present

## 2021-11-28 DIAGNOSIS — M542 Cervicalgia: Secondary | ICD-10-CM | POA: Diagnosis not present
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
## 2021-12-01 ENCOUNTER — Other Ambulatory Visit: Payer: Self-pay

## 2021-12-01 ENCOUNTER — Ambulatory Visit: Payer: BC Managed Care – PPO

## 2021-12-01 DIAGNOSIS — M542 Cervicalgia: Secondary | ICD-10-CM | POA: Diagnosis not present

## 2021-12-01 DIAGNOSIS — R42 Dizziness and giddiness: Secondary | ICD-10-CM | POA: Diagnosis not present

## 2021-12-01 NOTE — Telephone Encounter (Signed)
Received this message from plan on PA: Drug is covered by current benefit plan. No further PA activity needed. Key: [id number].  ? ? ? ? ?
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
## 2021-12-08 ENCOUNTER — Ambulatory Visit: Payer: BC Managed Care – PPO | Admitting: Physical Therapy

## 2021-12-08 ENCOUNTER — Other Ambulatory Visit: Payer: Self-pay | Admitting: Neurology

## 2021-12-08 ENCOUNTER — Other Ambulatory Visit: Payer: Self-pay

## 2021-12-08 DIAGNOSIS — M542 Cervicalgia: Secondary | ICD-10-CM

## 2021-12-08 DIAGNOSIS — R42 Dizziness and giddiness: Secondary | ICD-10-CM

## 2021-12-08 DIAGNOSIS — Z111 Encounter for screening for respiratory tuberculosis: Secondary | ICD-10-CM | POA: Diagnosis not present

## 2021-12-08 NOTE — Patient Instructions (Signed)
Access Code: [email or web address] ?URL: [email or web address] ?Date: 12/08/2021 ?Prepared by: Bufford Lope ? ?Exercises ?- Seated Cervical Sidebending Stretch  - 1 x daily - 7 x weekly - 1 sets - 3 reps - 60 second hold ?- Seated Scalene Stretch with Towel  - 1 x daily - 7 x weekly - 1 sets - 3 reps - 60 second  hold ?- Eyes Stable - Head says "No"  - 1 x daily - 7 x weekly - 2 sets - 60 second hold ?- Eyes Stable - Head says "Yes"  - 1 x daily - 7 x weekly - 2 sets - 60 seconds hold ?
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
## 2021-12-09 DIAGNOSIS — G43019 Migraine without aura, intractable, without status migrainosus: Secondary | ICD-10-CM | POA: Diagnosis not present

## 2021-12-09 DIAGNOSIS — R42 Dizziness and giddiness: Secondary | ICD-10-CM | POA: Diagnosis not present

## 2021-12-09 DIAGNOSIS — Z0110 Encounter for examination of ears and hearing without abnormal findings: Secondary | ICD-10-CM | POA: Diagnosis not present

## 2021-12-10 DIAGNOSIS — Z13220 Encounter for screening for lipoid disorders: Secondary | ICD-10-CM | POA: Diagnosis not present

## 2021-12-10 DIAGNOSIS — Z130 Encounter for screening for diseases of the blood and blood-forming organs and certain disorders involving the immune mechanism: Secondary | ICD-10-CM | POA: Diagnosis not present

## 2021-12-10 DIAGNOSIS — Z0000 Encounter for general adult medical examination without abnormal findings: Secondary | ICD-10-CM | POA: Diagnosis not present

## 2021-12-10 DIAGNOSIS — Z131 Encounter for screening for diabetes mellitus: Secondary | ICD-10-CM | POA: Diagnosis not present

## 2021-12-11 ENCOUNTER — Ambulatory Visit: Payer: BC Managed Care – PPO

## 2021-12-11 DIAGNOSIS — R42 Dizziness and giddiness: Secondary | ICD-10-CM | POA: Diagnosis not present

## 2021-12-11 DIAGNOSIS — M542 Cervicalgia: Secondary | ICD-10-CM
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
## 2021-12-12 ENCOUNTER — Telehealth: Payer: Self-pay | Admitting: Neurology

## 2021-12-12 MED ORDER — SUMATRIPTAN 20 MG/ACT NA SOLN: 20.0000 mg | NASAL | 5 refills | Status: DC | PRN

## 2021-12-12 NOTE — Telephone Encounter (Signed)
Patient gets usually nauseated with her migraines but is immediately to the point of emesis when using tosymra. ?She asked for "plain Opal Sidles " Sumatriptan generic NS at 20 mg. ?I filled that request and sent it to [facility] at [address], 5 refills.  ? ?  ? ? ? ?

## 2021-12-12 NOTE — Telephone Encounter (Signed)
Pt has asked that a message be sent to [facility] re: a worsening migraine that she has had since Tuesday of this week.  Pt would like for something to be called in for some type of relief. ?

## 2021-12-15 ENCOUNTER — Ambulatory Visit: Payer: BC Managed Care – PPO | Attending: Neurology

## 2021-12-15 DIAGNOSIS — R293 Abnormal posture: Secondary | ICD-10-CM | POA: Diagnosis not present

## 2021-12-15 DIAGNOSIS — M542 Cervicalgia: Secondary | ICD-10-CM | POA: Diagnosis not present

## 2021-12-15 DIAGNOSIS — R42 Dizziness and giddiness: Secondary | ICD-10-CM | POA: Insufficient documentation
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
## 2021-12-15 NOTE — Telephone Encounter (Signed)
Noted  

## 2021-12-15 NOTE — Telephone Encounter (Signed)
Pt ended up calling on-call MD and got generic sumatriptan nasal spray. ?

## 2021-12-18 ENCOUNTER — Ambulatory Visit: Payer: BC Managed Care – PPO

## 2021-12-18 DIAGNOSIS — M542 Cervicalgia: Secondary | ICD-10-CM | POA: Diagnosis not present

## 2021-12-18 DIAGNOSIS — R42 Dizziness and giddiness: Secondary | ICD-10-CM

## 2021-12-18 DIAGNOSIS — R293 Abnormal posture: Secondary | ICD-10-CM | POA: Diagnosis not present
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
## 2021-12-22 ENCOUNTER — Ambulatory Visit: Payer: BC Managed Care – PPO

## 2021-12-22 DIAGNOSIS — M542 Cervicalgia: Secondary | ICD-10-CM

## 2021-12-22 DIAGNOSIS — R42 Dizziness and giddiness: Secondary | ICD-10-CM | POA: Diagnosis not present

## 2021-12-22 DIAGNOSIS — R293 Abnormal posture: Secondary | ICD-10-CM | POA: Diagnosis not present
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
## 2021-12-26 ENCOUNTER — Ambulatory Visit: Payer: BC Managed Care – PPO

## 2021-12-26 DIAGNOSIS — M542 Cervicalgia: Secondary | ICD-10-CM

## 2021-12-26 DIAGNOSIS — R293 Abnormal posture: Secondary | ICD-10-CM | POA: Diagnosis not present

## 2021-12-26 DIAGNOSIS — R42 Dizziness and giddiness: Secondary | ICD-10-CM | POA: Diagnosis not present
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
## 2021-12-30 ENCOUNTER — Ambulatory Visit: Payer: BC Managed Care – PPO

## 2021-12-30 DIAGNOSIS — R42 Dizziness and giddiness: Secondary | ICD-10-CM | POA: Diagnosis not present

## 2021-12-30 DIAGNOSIS — M542 Cervicalgia: Secondary | ICD-10-CM

## 2021-12-30 DIAGNOSIS — R293 Abnormal posture: Secondary | ICD-10-CM | POA: Diagnosis not present
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
## 2022-01-01 ENCOUNTER — Ambulatory Visit: Payer: BC Managed Care – PPO

## 2022-01-01 DIAGNOSIS — M542 Cervicalgia: Secondary | ICD-10-CM | POA: Diagnosis not present

## 2022-01-01 DIAGNOSIS — R293 Abnormal posture: Secondary | ICD-10-CM | POA: Diagnosis not present

## 2022-01-01 DIAGNOSIS — R42 Dizziness and giddiness: Secondary | ICD-10-CM
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
## 2022-01-05 ENCOUNTER — Ambulatory Visit: Payer: BC Managed Care – PPO

## 2022-01-05 DIAGNOSIS — M542 Cervicalgia: Secondary | ICD-10-CM

## 2022-01-05 DIAGNOSIS — R42 Dizziness and giddiness: Secondary | ICD-10-CM | POA: Diagnosis not present

## 2022-01-05 DIAGNOSIS — R293 Abnormal posture: Secondary | ICD-10-CM
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
## 2022-01-08 ENCOUNTER — Ambulatory Visit: Payer: BC Managed Care – PPO

## 2022-01-08 DIAGNOSIS — R293 Abnormal posture: Secondary | ICD-10-CM | POA: Diagnosis not present

## 2022-01-08 DIAGNOSIS — R42 Dizziness and giddiness: Secondary | ICD-10-CM

## 2022-01-08 DIAGNOSIS — M542 Cervicalgia: Secondary | ICD-10-CM
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
## 2022-01-12 ENCOUNTER — Ambulatory Visit: Payer: BC Managed Care – PPO | Attending: Neurology

## 2022-01-12 DIAGNOSIS — R42 Dizziness and giddiness: Secondary | ICD-10-CM | POA: Diagnosis not present

## 2022-01-12 DIAGNOSIS — R293 Abnormal posture: Secondary | ICD-10-CM | POA: Diagnosis not present

## 2022-01-12 DIAGNOSIS — M542 Cervicalgia: Secondary | ICD-10-CM | POA: Diagnosis not present
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
## 2022-01-14 ENCOUNTER — Encounter: Payer: Self-pay | Admitting: Physical Therapy

## 2022-01-14 ENCOUNTER — Ambulatory Visit: Payer: BC Managed Care – PPO | Admitting: Physical Therapy

## 2022-01-14 DIAGNOSIS — M542 Cervicalgia: Secondary | ICD-10-CM | POA: Diagnosis not present

## 2022-01-14 DIAGNOSIS — R42 Dizziness and giddiness: Secondary | ICD-10-CM | POA: Diagnosis not present

## 2022-01-14 DIAGNOSIS — R293 Abnormal posture: Secondary | ICD-10-CM | POA: Diagnosis not present
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
## 2022-01-15 ENCOUNTER — Ambulatory Visit: Payer: BC Managed Care – PPO

## 2022-01-19 ENCOUNTER — Ambulatory Visit: Payer: BC Managed Care – PPO

## 2022-01-20 DIAGNOSIS — H52523 Paresis of accommodation, bilateral: Secondary | ICD-10-CM | POA: Diagnosis not present

## 2022-01-20 DIAGNOSIS — F0781 Postconcussional syndrome: Secondary | ICD-10-CM | POA: Diagnosis not present

## 2022-01-20 DIAGNOSIS — H5581 Saccadic eye movements: Secondary | ICD-10-CM | POA: Diagnosis not present

## 2022-01-20 DIAGNOSIS — H5111 Convergence insufficiency: Secondary | ICD-10-CM | POA: Diagnosis not present

## 2022-01-21 ENCOUNTER — Ambulatory Visit: Payer: BC Managed Care – PPO

## 2022-01-22 ENCOUNTER — Ambulatory Visit: Payer: BC Managed Care – PPO | Admitting: Physical Therapy

## 2022-01-22 ENCOUNTER — Encounter: Payer: Self-pay | Admitting: Physical Therapy

## 2022-01-22 DIAGNOSIS — M542 Cervicalgia: Secondary | ICD-10-CM | POA: Diagnosis not present

## 2022-01-22 DIAGNOSIS — R42 Dizziness and giddiness: Secondary | ICD-10-CM | POA: Diagnosis not present

## 2022-01-22 DIAGNOSIS — R293 Abnormal posture: Secondary | ICD-10-CM
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
## 2022-01-26 ENCOUNTER — Encounter: Payer: Self-pay | Admitting: Physical Therapy

## 2022-01-26 ENCOUNTER — Ambulatory Visit: Payer: BC Managed Care – PPO | Admitting: Physical Therapy

## 2022-01-26 DIAGNOSIS — R42 Dizziness and giddiness: Secondary | ICD-10-CM

## 2022-01-26 DIAGNOSIS — M542 Cervicalgia: Secondary | ICD-10-CM | POA: Diagnosis not present

## 2022-01-26 DIAGNOSIS — R293 Abnormal posture: Secondary | ICD-10-CM | POA: Diagnosis not present
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
## 2022-01-28 ENCOUNTER — Ambulatory Visit: Payer: BC Managed Care – PPO

## 2022-01-30 ENCOUNTER — Encounter: Payer: Self-pay | Admitting: Physical Therapy

## 2022-01-30 ENCOUNTER — Ambulatory Visit: Payer: BC Managed Care – PPO | Admitting: Physical Therapy

## 2022-01-30 DIAGNOSIS — R42 Dizziness and giddiness: Secondary | ICD-10-CM

## 2022-01-30 DIAGNOSIS — R293 Abnormal posture: Secondary | ICD-10-CM | POA: Diagnosis not present

## 2022-01-30 DIAGNOSIS — M542 Cervicalgia: Secondary | ICD-10-CM
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
## 2022-02-02 DIAGNOSIS — M47812 Spondylosis without myelopathy or radiculopathy, cervical region: Secondary | ICD-10-CM | POA: Diagnosis not present

## 2022-02-03 NOTE — Addendum Note (Signed)
Addended by: Drake Leach on: 02/03/2022 11:23 AM   Modules accepted: Orders

## 2022-02-04 ENCOUNTER — Ambulatory Visit: Payer: BC Managed Care – PPO | Admitting: Physical Therapy

## 2022-02-04 DIAGNOSIS — R42 Dizziness and giddiness: Secondary | ICD-10-CM | POA: Diagnosis not present

## 2022-02-04 DIAGNOSIS — M542 Cervicalgia: Secondary | ICD-10-CM

## 2022-02-04 DIAGNOSIS — R293 Abnormal posture: Secondary | ICD-10-CM | POA: Diagnosis not present
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
## 2022-02-06 ENCOUNTER — Ambulatory Visit: Payer: BC Managed Care – PPO

## 2022-02-06 DIAGNOSIS — R293 Abnormal posture: Secondary | ICD-10-CM

## 2022-02-06 DIAGNOSIS — M542 Cervicalgia: Secondary | ICD-10-CM

## 2022-02-06 DIAGNOSIS — R42 Dizziness and giddiness: Secondary | ICD-10-CM | POA: Diagnosis not present
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
## 2022-02-13 ENCOUNTER — Ambulatory Visit: Payer: BC Managed Care – PPO | Attending: Neurology

## 2022-02-13 DIAGNOSIS — R293 Abnormal posture: Secondary | ICD-10-CM | POA: Diagnosis not present

## 2022-02-13 DIAGNOSIS — R42 Dizziness and giddiness: Secondary | ICD-10-CM | POA: Insufficient documentation

## 2022-02-13 DIAGNOSIS — M542 Cervicalgia: Secondary | ICD-10-CM | POA: Diagnosis not present
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
## 2022-02-13 NOTE — Patient Instructions (Addendum)
Oculomotor: Smooth Pursuits    Holding a target, keep eyes on target and slowly move target side to side with head still. Perform sitting or standing. Repeat 5 times per session. Do 1 sessions per day.  Copyright  VHI. All rights reserved.

## 2022-02-14 DIAGNOSIS — M47812 Spondylosis without myelopathy or radiculopathy, cervical region: Secondary | ICD-10-CM | POA: Diagnosis not present

## 2022-02-20 ENCOUNTER — Ambulatory Visit: Payer: BC Managed Care – PPO | Admitting: Physical Therapy

## 2022-02-20 ENCOUNTER — Other Ambulatory Visit (HOSPITAL_COMMUNITY)
Admission: RE | Admit: 2022-02-20 | Discharge: 2022-02-20 | Disposition: A | Discharge status: Home / Self Care | Payer: BC Managed Care – PPO | Source: Ambulatory Visit | Attending: Nurse Practitioner | Admitting: Nurse Practitioner

## 2022-02-20 ENCOUNTER — Other Ambulatory Visit: Payer: Self-pay | Admitting: Nurse Practitioner

## 2022-02-20 ENCOUNTER — Encounter: Payer: Self-pay | Admitting: Physical Therapy

## 2022-02-20 DIAGNOSIS — R42 Dizziness and giddiness: Secondary | ICD-10-CM | POA: Diagnosis not present

## 2022-02-20 DIAGNOSIS — Z124 Encounter for screening for malignant neoplasm of cervix: Secondary | ICD-10-CM | POA: Insufficient documentation

## 2022-02-20 DIAGNOSIS — R293 Abnormal posture: Secondary | ICD-10-CM | POA: Diagnosis not present

## 2022-02-20 DIAGNOSIS — Z30431 Encounter for routine checking of intrauterine contraceptive device: Secondary | ICD-10-CM | POA: Diagnosis not present

## 2022-02-20 DIAGNOSIS — M542 Cervicalgia: Secondary | ICD-10-CM | POA: Diagnosis not present

## 2022-02-20 DIAGNOSIS — N871 Moderate cervical dysplasia: Secondary | ICD-10-CM | POA: Diagnosis not present
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
## 2022-02-22 DIAGNOSIS — M47812 Spondylosis without myelopathy or radiculopathy, cervical region: Secondary | ICD-10-CM | POA: Diagnosis not present

## 2022-02-22 DIAGNOSIS — H539 Unspecified visual disturbance: Secondary | ICD-10-CM | POA: Diagnosis not present

## 2022-02-23 ENCOUNTER — Ambulatory Visit: Payer: BC Managed Care – PPO

## 2022-02-23 DIAGNOSIS — R42 Dizziness and giddiness: Secondary | ICD-10-CM

## 2022-02-23 DIAGNOSIS — M542 Cervicalgia: Secondary | ICD-10-CM

## 2022-02-23 DIAGNOSIS — R293 Abnormal posture: Secondary | ICD-10-CM

## 2022-02-23 NOTE — Patient Instructions (Signed)
Oculomotor: Saccades    Holding two targets positioned side by side 7-8 inches apart, move eyes quickly from target to target as head stays still. Perform sitting. Repeat 10 times per direction. Do 1 sessions per day.
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
## 2022-02-26 LAB — CYTOLOGY - PAP
Comment: NEGATIVE
Comment: NEGATIVE
Comment: NEGATIVE
HPV 16: NEGATIVE
HPV 18 / 45: NEGATIVE
High risk HPV: POSITIVE — AB

## 2022-03-03 DIAGNOSIS — Z9889 Other specified postprocedural states: Secondary | ICD-10-CM | POA: Diagnosis not present

## 2022-03-03 DIAGNOSIS — R87612 Low grade squamous intraepithelial lesion on cytologic smear of cervix (LGSIL): Secondary | ICD-10-CM | POA: Diagnosis not present

## 2022-03-03 DIAGNOSIS — T8332XA Displacement of intrauterine contraceptive device, initial encounter: Secondary | ICD-10-CM | POA: Diagnosis not present

## 2022-03-03 DIAGNOSIS — B977 Papillomavirus as the cause of diseases classified elsewhere: Secondary | ICD-10-CM | POA: Diagnosis not present

## 2022-03-04 ENCOUNTER — Ambulatory Visit: Payer: BC Managed Care – PPO | Admitting: Physical Therapy

## 2022-03-04 ENCOUNTER — Encounter: Payer: Self-pay | Admitting: Physical Therapy

## 2022-03-04 DIAGNOSIS — R293 Abnormal posture: Secondary | ICD-10-CM

## 2022-03-04 DIAGNOSIS — R42 Dizziness and giddiness: Secondary | ICD-10-CM

## 2022-03-04 DIAGNOSIS — M542 Cervicalgia: Secondary | ICD-10-CM | POA: Diagnosis not present
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
## 2022-03-10 DIAGNOSIS — T8332XA Displacement of intrauterine contraceptive device, initial encounter: Secondary | ICD-10-CM | POA: Diagnosis not present

## 2022-03-11 DIAGNOSIS — Z3202 Encounter for pregnancy test, result negative: Secondary | ICD-10-CM | POA: Diagnosis not present

## 2022-03-11 DIAGNOSIS — N72 Inflammatory disease of cervix uteri: Secondary | ICD-10-CM | POA: Diagnosis not present

## 2022-03-12 DIAGNOSIS — M47812 Spondylosis without myelopathy or radiculopathy, cervical region: Secondary | ICD-10-CM | POA: Diagnosis not present

## 2022-03-13 ENCOUNTER — Ambulatory Visit: Payer: BC Managed Care – PPO

## 2022-03-13 DIAGNOSIS — R42 Dizziness and giddiness: Secondary | ICD-10-CM | POA: Diagnosis not present

## 2022-03-13 DIAGNOSIS — R293 Abnormal posture: Secondary | ICD-10-CM

## 2022-03-13 DIAGNOSIS — M542 Cervicalgia: Secondary | ICD-10-CM
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
## 2022-03-19 ENCOUNTER — Ambulatory Visit: Payer: BC Managed Care – PPO | Admitting: Adult Health

## 2022-03-19 DIAGNOSIS — G43009 Migraine without aura, not intractable, without status migrainosus: Secondary | ICD-10-CM

## 2022-03-19 NOTE — Progress Notes (Signed)
PATIENT: Melissa Boyd DOB: [birth year]  REASON FOR VISIT: follow up HISTORY FROM: patient PRIMARY NEUROLOGIST: Dr. Lucia Gaskins  Chief Complaint  Patient presents with   Follow-up    Rm 19 alone- reports she has been doing well. No complaints.      HISTORY OF PRESENT ILLNESS: Today 03/19/22:  Melissa Boyd is a 26 year old female with a history of migraines. She returns today for follow-up.  No migraines for 2 months. Still uses nortriptyline 10 mg at bedtime. Uses Imitrex and it works well. Thinking about stopping nortriptyline. Currently in vestibular rehab and feels that it has been beneficial. Will be emailing me notes from specialist she saw in [place].   HISTORY HPI 10/27/2021: Seen her in the past for migraines. In November she started having dizziness, turning head feeling dizzy, room spinning, laying in bed was bad, she wouldn't fall but it made her feel sick and nauseated, turning and bending over made it worse. She had vestibula therapy, would happen randomly throughout the day. She never had dizziness with her migraines and has not had a migraine headache since prior to this dizziness started. Epley maneuvers did not help. Last spell was in January, otherwise it has improved. No hearing changes. She still has fatigue but that's improving as well. She has a lot of neck pain and feels like that is associated. She had a more recent MRI at [facility] with some c6/c7 degenerative changes.MRi brain in 2016 was normal but given vision changes, vertigo, dizziness, nausea may want to repeat MRI brain to rule out any neurologic etiologies. No inciting events, no new medication, no infections, no head trauma, no allergies. Certain positions in the bed would make the room spinin the past, now she just doesn't feel well she is tired. Blurry vision/tired eyes, needs to close eyes, no headaches, no double vision, no pain or pressure in the ears. Has neck pain, she went to [facility] and had  injections would go back, she had massage therapy.    2016: IMPRESSION: Normal MRI appearance of the brain and cervical spine. But did show muscle spasms(straightening of the cervical lordosis)     REVIEW OF SYSTEMS: Out of a complete 14 system review of symptoms, the patient complains only of the following symptoms, and all other reviewed systems are negative.  ALLERGIES: Allergies  Allergen Reactions   Other Anaphylaxis    Any products that contain shellfish   Shellfish Allergy Anaphylaxis   Naproxen Nausea And Vomiting   Amoxicillin Other (See Comments)   Doxycycline Other (See Comments)    HOME MEDICATIONS: Outpatient Medications Prior to Visit  Medication Sig Dispense Refill   clindamycin (CLEOCIN T) 1 % lotion clindamycin 1 % lotion  APPLY DAILY TO SKIN TO AFFECTED AREA EVERY DAY FOR 30 DAYS     Elderberry 575 MG/5ML SYRP      EPINEPHrine (AUVI-Q) 0.3 mg/0.3 mL IJ SOAJ injection Use as directed for severe allergic reactions 4 each 3   meclizine (ANTIVERT) 25 MG tablet 1 tablet as needed     Multiple Vitamins-Minerals (MULTIVITAMIN ADULT EXTRA C PO)      nortriptyline (PAMELOR) 10 MG capsule TAKE 1 CAPSULE BY MOUTH AT BEDTIME. 90 capsule 0   paragard intrauterine copper IUD IUD 1 each by Intrauterine route once.     SUMAtriptan (IMITREX) 20 MG/ACT nasal spray Place 1 spray (20 mg total) into the nose every 2 (two) hours as needed for migraine or headache. May repeat in 2 hours  if headache persists or recurs. 1 each 5   tretinoin (RETIN-A) 0.05 % cream tretinoin 0.05 % topical cream  APPLY TO FACE ONCE A DAY IN THE EVENING     ALPRAZolam (XANAX) 0.25 MG tablet Take 1-2 tabs (0.25mg -0.50mg ) 30-60 minutes before procedure. May repeat if needed.Do not drive. 4 tablet 0   methylPREDNISolone (MEDROL DOSEPAK) 4 MG TBPK tablet Take pills daily all together with food. Take the first dose (6 pills) as soon as possible. Take the rest each morning. For 6 days total 6-5-4-3-2-1. 21 tablet 1    No facility-administered medications prior to visit.    PAST MEDICAL HISTORY: Past Medical History:  Diagnosis Date   Allergy    Phreesia 04/29/2020   DVT (deep venous thrombosis) (HCC)    Headache, migraine    Urticaria     PAST SURGICAL HISTORY: Past Surgical History:  Procedure Laterality Date   SHOULDER SURGERY     left   WISDOM TOOTH EXTRACTION      FAMILY HISTORY: Family History  Problem Relation Age of Onset   Anemia Father        low iron    Alzheimer's disease Paternal Grandfather    Migraines Brother    Hypertension Maternal Aunt    Migraines Cousin        Mother's 1st cousin; has them really bad    Migraines Cousin     SOCIAL HISTORY: Social History   Socioeconomic History   Marital status: Single    Spouse name: Not on file   Number of children: Not on file   Years of education: Not on file   Highest education level: Not on file  Occupational History   Not on file  Tobacco Use   Smoking status: Never   Smokeless tobacco: Never  Vaping Use   Vaping Use: Never used  Substance and Sexual Activity   Alcohol use: Yes    Comment: maybe once a month   Drug use: No   Sexual activity: Yes    Birth control/protection: Condom, I.U.D.  Other Topics Concern   Not on file  Social History Narrative   Melissa Boyd graduated from [employer], 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 She enjoys track and field.   Lives in [place].    Right handed   Caffeine: maybe once a week. Neita Carp)      Social Determinants of Health   Financial Resource Strain: Not on file  Food Insecurity: Not on file  Transportation Needs: Not on file  Physical Activity: Not on file  Stress: Not on file  Social Connections: Not on file  Intimate Partner Violence: Not on file      PHYSICAL EXAM  There were no vitals filed for this visit. There is no height or weight on file to calculate BMI.  Generalized: Well developed, in no acute  distress   Neurological examination  Mentation: Alert oriented to time, place, history taking. Follows all commands speech and language fluent Cranial nerve II-XII: Pupils were equal round reactive to light. Extraocular movements were full, visual field were full on confrontational test. Facial sensation and strength were normal. Head turning and shoulder shrug  were normal and symmetric. Motor: The motor testing reveals 5 over 5 strength of all 4 extremities. Good symmetric motor tone is noted throughout.  Sensory: Sensory testing is intact to soft touch on all 4 extremities. No evidence of extinction is noted.  Coordination: Cerebellar testing reveals good finger-nose-finger  and heel-to-shin bilaterally.  Gait and station: Gait is normal. Tandem gait is normal. Romberg is negative. No drift is seen.    DIAGNOSTIC DATA (LABS, IMAGING, TESTING) - I reviewed patient records, labs, notes, testing and imaging myself where available.  Lab Results  Component Value Date   WBC 5.1 09/16/2021   HGB 13.1 09/16/2021   HCT 38.1 09/16/2021   MCV 98.7 09/16/2021   PLT 184 09/16/2021      Component Value Date/Time   NA 135 09/16/2021 1519   NA 137 11/13/2020 1521   NA 140 08/02/2014 1842   K 4.2 09/16/2021 1519   K 3.8 08/02/2014 1842   CL 104 09/16/2021 1519   CL 109 (H) 08/02/2014 1842   CO2 26 09/16/2021 1519   CO2 24 08/02/2014 1842   GLUCOSE 94 09/16/2021 1519   GLUCOSE 70 08/02/2014 1842   BUN 11 09/16/2021 1519   BUN 10 11/13/2020 1521   BUN 15 08/02/2014 1842   CREATININE 0.76 09/16/2021 1519   CREATININE 1.05 08/02/2014 1842   CALCIUM 8.7 (L) 09/16/2021 1519   CALCIUM 8.0 (L) 08/02/2014 1842   PROT 7.3 09/16/2021 1519   PROT 7.4 11/13/2020 1521   ALBUMIN 3.8 09/16/2021 1519   ALBUMIN 4.3 11/13/2020 1521   AST 13 (L) 09/16/2021 1519   ALT 14 09/16/2021 1519   ALKPHOS 46 09/16/2021 1519   BILITOT 0.5 09/16/2021 1519   BILITOT 0.3 11/13/2020 1521   GFRNONAA >60 09/16/2021  1519   GFRNONAA >60 08/02/2014 1842   GFRAA >90 12/23/2014 1433   GFRAA >60 08/02/2014 1842   No results found for: "CHOL", "HDL", "LDLCALC", "LDLDIRECT", "TRIG", "CHOLHDL" No results found for: "HGBA1C" No results found for: "VITAMINB12" Lab Results  Component Value Date   TSH 1.430 11/13/2020      ASSESSMENT AND PLAN 26 y.o. year old female  has a past medical history of Allergy, DVT (deep venous thrombosis) (HCC), Headache, migraine, and Urticaria. here with:  Migraine headache  Continue Nortriptyline Discussed stopping/weaning off medication- advised to stay on medication for now. We discuss again in 3 months. Continue Imitrex for abortive therapy FU in 3 months or sooner if needed    Butch Penny, MSN, NP-C 03/19/2022, 3:06 PM [facility] [address] [place], [place] [address] [phone]

## 2022-03-20 ENCOUNTER — Ambulatory Visit: Payer: BC Managed Care – PPO | Attending: Neurology

## 2022-03-20 DIAGNOSIS — R293 Abnormal posture: Secondary | ICD-10-CM | POA: Diagnosis not present

## 2022-03-20 DIAGNOSIS — R42 Dizziness and giddiness: Secondary | ICD-10-CM | POA: Diagnosis not present

## 2022-03-20 DIAGNOSIS — M542 Cervicalgia: Secondary | ICD-10-CM | POA: Insufficient documentation
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
## 2022-03-26 ENCOUNTER — Ambulatory Visit: Payer: BC Managed Care – PPO

## 2022-03-26 DIAGNOSIS — R42 Dizziness and giddiness: Secondary | ICD-10-CM

## 2022-03-26 DIAGNOSIS — R293 Abnormal posture: Secondary | ICD-10-CM

## 2022-03-26 DIAGNOSIS — M542 Cervicalgia: Secondary | ICD-10-CM
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
## 2022-04-08 ENCOUNTER — Other Ambulatory Visit: Payer: Self-pay | Admitting: Neurology

## 2022-06-26 ENCOUNTER — Telehealth (INDEPENDENT_AMBULATORY_CARE_PROVIDER_SITE_OTHER): Payer: BC Managed Care – PPO | Admitting: Adult Health

## 2022-06-26 DIAGNOSIS — M47892 Other spondylosis, cervical region: Secondary | ICD-10-CM | POA: Diagnosis not present

## 2022-06-26 DIAGNOSIS — G43109 Migraine with aura, not intractable, without status migrainosus: Secondary | ICD-10-CM | POA: Diagnosis not present

## 2022-06-26 DIAGNOSIS — M47812 Spondylosis without myelopathy or radiculopathy, cervical region: Secondary | ICD-10-CM | POA: Diagnosis not present
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
## 2022-07-17 ENCOUNTER — Other Ambulatory Visit: Payer: Self-pay | Admitting: Adult Health

## 2022-07-21 ENCOUNTER — Ambulatory Visit
Admission: EM | Admit: 2022-07-21 | Discharge: 2022-07-21 | Disposition: A | Discharge status: Home / Self Care | Payer: BC Managed Care – PPO | Attending: Family Medicine | Admitting: Family Medicine

## 2022-07-21 DIAGNOSIS — E876 Hypokalemia: Secondary | ICD-10-CM

## 2022-07-21 DIAGNOSIS — R110 Nausea: Secondary | ICD-10-CM | POA: Diagnosis not present

## 2022-07-21 DIAGNOSIS — R109 Unspecified abdominal pain: Secondary | ICD-10-CM

## 2022-07-21 LAB — COMPREHENSIVE METABOLIC PANEL
ALT: 15 U/L (ref 0–44)
AST: 19 U/L (ref 15–41)
Albumin: 4.2 g/dL (ref 3.5–5.0)
Alkaline Phosphatase: 55 U/L (ref 38–126)
Anion gap: 4 — ABNORMAL LOW (ref 5–15)
BUN: 14 mg/dL (ref 6–20)
CO2: 26 mmol/L (ref 22–32)
Calcium: 8.7 mg/dL — ABNORMAL LOW (ref 8.9–10.3)
Chloride: 105 mmol/L (ref 98–111)
Creatinine, Ser: 0.82 mg/dL (ref 0.44–1.00)
GFR, Estimated: >60 mL/min (ref >60)
Glucose, Bld: 84 mg/dL (ref 70–99)
Potassium: 3.3 mmol/L — ABNORMAL LOW (ref 3.5–5.1)
Sodium: 135 mmol/L (ref 135–145)
Total Bilirubin: 0.4 mg/dL (ref 0.3–1.2)
Total Protein: 8.4 g/dL — ABNORMAL HIGH (ref 6.5–8.1)

## 2022-07-21 LAB — URINALYSIS, ROUTINE W REFLEX MICROSCOPIC
Bilirubin Urine: NEGATIVE
Glucose, UA: NEGATIVE mg/dL
Leukocytes,Ua: NEGATIVE
Nitrite: NEGATIVE
Protein, ur: NEGATIVE mg/dL
Specific Gravity, Urine: 1.025 (ref 1.005–1.030)
pH: 5.5 (ref 5.0–8.0)

## 2022-07-21 LAB — CBC WITH DIFFERENTIAL/PLATELET
Abs Immature Granulocytes: 0.02 10*3/uL (ref 0.00–0.07)
Basophils Absolute: 0.1 10*3/uL (ref 0.0–0.1)
Basophils Relative: 1 %
Eosinophils Absolute: 0.1 10*3/uL (ref 0.0–0.5)
Eosinophils Relative: 1 %
HCT: 40.1 % (ref 36.0–46.0)
Hemoglobin: 13.7 g/dL (ref 12.0–15.0)
Immature Granulocytes: 0 %
Lymphocytes Relative: 44 %
Lymphs Abs: 2.5 10*3/uL (ref 0.7–4.0)
MCH: 32.8 pg (ref 26.0–34.0)
MCHC: 34.2 g/dL (ref 30.0–36.0)
MCV: 95.9 fL (ref 80.0–100.0)
Monocytes Absolute: 0.4 10*3/uL (ref 0.1–1.0)
Monocytes Relative: 6 %
Neutro Abs: 2.6 10*3/uL (ref 1.7–7.7)
Neutrophils Relative %: 48 %
Platelets: 229 10*3/uL (ref 150–400)
RBC: 4.18 MIL/uL (ref 3.87–5.11)
RDW: 11.8 % (ref 11.5–15.5)
WBC: 5.6 10*3/uL (ref 4.0–10.5)
nRBC: 0 % (ref 0.0–0.2)

## 2022-07-21 LAB — URINALYSIS, MICROSCOPIC (REFLEX)

## 2022-07-21 LAB — LIPASE, BLOOD: Lipase: 45 U/L (ref 11–51)

## 2022-07-21 LAB — PREGNANCY, URINE: Preg Test, Ur: NEGATIVE

## 2022-07-21 MED ORDER — ONDANSETRON HCL 8 MG PO TABS: 8.0000 mg | ORAL_TABLET | Freq: Three times a day (TID) | ORAL | 0 refills | Status: DC | PRN

## 2022-07-21 MED ORDER — POTASSIUM CHLORIDE CRYS ER 20 MEQ PO TBCR
40.0000 meq | EXTENDED_RELEASE_TABLET | Freq: Once | ORAL | Status: AC
Start: 2022-07-21 — End: 2022-07-21
  Administered 2022-07-21: 40 meq via ORAL

## 2022-07-21 NOTE — Discharge Instructions (Addendum)
Your blood and urine studies did not determine the cause of your nausea. I sent some nausea medications to your pharmacy.

## 2022-07-21 NOTE — ED Triage Notes (Signed)
Patient presents to [facility] for nausea, abdominal cramping/bloating since Sunday. Concerned with pregnancy. Pt states IUD removed in July. Last LMP in September. No using BC or protection. States last sexual encounter believes 10/22. Negative preg test this morning.

## 2022-07-21 NOTE — ED Provider Notes (Signed)
[facility]    CSN: [id number] Arrival date & time: 07/21/22  1346      History   Chief Complaint Chief Complaint  Patient presents with   Nausea    HPI Ashonte Angelucci is a 26 y.o. female.   HPI  Oneal presents for persistent nausea for the past 3 days. She is 10 days late from getting her period.  She has bloating and cramping. Has some left lower back pain. No dysuria, fever, urinary frequency or urgency,  hematuria, history of kidney stone, cough, nasal congestion, or sore throat. Patient's last menstrual period was 06/11/2022.  She had her IUD removed in July.  Request pregnancy test.       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   Other allergic rhinitis 11/13/2020   Adverse reaction to food, subsequent encounter 11/13/2020   Numbness and tingling of right side of face 08/01/2020   Palpitations 09/10/2015   Idiopathic urticaria 07/01/2015   Food allergy 07/01/2015   Allergic rhinitis 06/28/2015   Subjective visual disturbance of left eye 05/29/2015   Neck pain on left side 05/27/2015   History of deep vein thrombosis of lower extremity 11/05/2014   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   Awareness of heartbeats 08/07/2014   Paroxysmal supraventricular tachycardia 08/07/2014   Breathlessness on exertion 08/07/2014   Migraine with aura 12/08/2012   Migraine variant 12/08/2012   Migraine without aura and without status migrainosus, not intractable 12/08/2012   Episodic tension type headache 12/08/2012   Ovarian cyst 09/01/2012   Migraines 04/18/2012   Headache, migraine 04/18/2012    Past Surgical History:  Procedure Laterality Date   SHOULDER SURGERY     left   WISDOM TOOTH  EXTRACTION      OB History     Gravida  0   Para      Term      Preterm      AB      Living         SAB      IAB      Ectopic      Multiple      Live Births               Home Medications    Prior to Admission medications   Medication Sig Start Date End Date Taking? Authorizing Provider  ondansetron (ZOFRAN) 8 MG tablet Take 1 tablet (8 mg total) by mouth every 8 (eight) hours as needed for nausea or vomiting. 07/21/22  Yes Kimmy Parish, DO  clindamycin (CLEOCIN T) 1 % lotion clindamycin 1 % lotion  APPLY DAILY TO SKIN TO AFFECTED AREA EVERY DAY FOR 30 DAYS    [provider]  Lucila Maine 575 MG/5ML SYRP     [provider]  EPINEPHrine (AUVI-Q) 0.3 mg/0.3 mL IJ SOAJ injection Use as directed for severe allergic reactions 11/13/20   Ellamae Sia, DO  Multiple Vitamins-Minerals (MULTIVITAMIN ADULT EXTRA C PO)     [provider]  nortriptyline (PAMELOR) 10 MG capsule TAKE 1 CAPSULE BY MOUTH EVERYDAY AT BEDTIME 07/20/22   Butch Penny, NP  paragard intrauterine copper IUD IUD 1 each by Intrauterine route once.  [provider]  SUMAtriptan (IMITREX) 20 MG/ACT nasal spray Place 1 spray (20 mg total) into the nose every 2 (two) hours as needed for migraine or headache. May repeat in 2 hours if headache persists or recurs. 12/12/21   Dohmeier, Porfirio Mylar, MD  tretinoin (RETIN-A) 0.05 % cream tretinoin 0.05 % topical cream  APPLY TO FACE ONCE A DAY IN THE EVENING    [provider]    Family History Family History  Problem Relation Age of Onset   Anemia Father        low iron    Alzheimer's disease Paternal Grandfather    Migraines Brother    Hypertension Maternal Aunt    Migraines Cousin        Mother's 1st cousin; has them really bad    Migraines Cousin     Social History Social History   Tobacco Use   Smoking status: Never   Smokeless tobacco: Never  Vaping Use   Vaping Use: Never used  Substance Use  Topics   Alcohol use: Yes    Comment: maybe once a month   Drug use: No     Allergies   Other, Shellfish allergy, Naproxen, Amoxicillin, and Doxycycline   Review of Systems Review of Systems : negative unless otherwise stated in HPI.      Physical Exam Triage Vital Signs ED Triage Vitals  Enc Vitals Group     BP 07/21/22 1437 118/81     Pulse Rate 07/21/22 1437 98     Resp 07/21/22 1437 16     Temp 07/21/22 1437 99 F (37.2 C)     Temp Source 07/21/22 1437 Oral     SpO2 07/21/22 1437 98 %     Weight --      Height --      Head Circumference --      Peak Flow --      Pain Score 07/21/22 1433 0     Pain Loc --      Pain Edu? --      Excl. in GC? --    No data found.  Updated Vital Signs BP 118/81 (BP Location: Right Arm)   Pulse 98   Temp 99 F (37.2 C) (Oral)   Resp 16   LMP 06/11/2022   SpO2 98%   Visual Acuity Right Eye Distance:   Left Eye Distance:   Bilateral Distance:    Right Eye Near:   Left Eye Near:    Bilateral Near:     Physical Exam  GEN: alert, well appearing female, in no acute distress      HENT:  mucus membranes moist, no oral lesions,  nares patent, EYES:   pupils equal and reactive, EOM intact NECK:  supple, normal ROM RESP:  clear to auscultation bilaterally, no increased work of breathing   CVS:   regular rate and rhythm, no murmur, distal pulses intact    ABD:  soft, non-tender; bowel sounds present; no palpable masses, no guarding, no rebound, no CVA tenderness EXT:   normal ROM, atraumatic, no edema   NEURO:  normal without focal findings,  speech normal, alert and oriented   Skin:   warm and dry, no rash , normal  skin turgor Psych: Normal affect, appropriate speech and behavior    UC Treatments / Results  Labs (all labs ordered are listed, but only abnormal results are displayed) Labs Reviewed  URINALYSIS, ROUTINE W REFLEX MICROSCOPIC - Abnormal; Notable for the following components:  Result Value   Hgb urine  dipstick TRACE (*)    Ketones, ur TRACE (*)    All other components within normal limits  URINALYSIS, MICROSCOPIC (REFLEX) - Abnormal; Notable for the following components:   Bacteria, UA MANY (*)    All other components within normal limits  COMPREHENSIVE METABOLIC PANEL - Abnormal; Notable for the following components:   Potassium 3.3 (*)    Calcium 8.7 (*)    Total Protein 8.4 (*)    Anion gap 4 (*)    All other components within normal limits  PREGNANCY, URINE  CBC WITH DIFFERENTIAL/PLATELET  LIPASE, BLOOD    EKG   Radiology No results found.  Procedures Procedures (including critical care time)  Medications Ordered in UC Medications  potassium chloride SA (KLOR-CON M) CR tablet 40 mEq (40 mEq Oral Given 07/21/22 1630)    Initial Impression / Assessment and Plan / UC Course  I have reviewed the triage vital signs and the nursing notes.  Pertinent labs & imaging results that were available during my care of the patient were reviewed by me and considered in my medical decision making (see chart for details).     Patient is a 26 year old female who presents for persistent nausea and abdominal pain for the past 3 days.  Urine pregnancy test is negative.  Urinalysis not concerning for acute cystitis though there was some bacteria seen and there were a lot of epithelial cells.  Lipase not concerning for pancreatitis.  Has a mild hypokalemia potassium 3.3.  Given 70mEq potassium chloride here.  No leukocytosis or anemia.  She denies any burning sensations without GERD.  He may have an element of gastritis as she has an elevated temperature of 99 F.  Vital signs otherwise stable.  CBC was unremarkable.  She denies any respiratory symptoms.    Etiology of patient's nausea and abdominal cramping uncertain.  Considered pelvic ultrasound however ultrasound has gone for today.  She has history of same nausea back in September 2021 she had a CT abdomen pelvis that did not find cause of  her nausea.  Declined repeat CT at this time.  Prescribe Zofran to help with nausea.  Return and ED precautions given and patient voiced understanding.  Discussed MDM, treatment plan and plan for follow-up with patient/parent who agrees with plan.   Final Clinical Impressions(s) / UC Diagnoses   Final diagnoses:  Hypokalemia  Nausea  Abdominal pain, unspecified abdominal location     Discharge Instructions      Your blood and urine studies did not determine the cause of your nausea. I sent some nausea medications to your pharmacy.       ED Prescriptions     Medication Sig Dispense Auth. Provider   ondansetron (ZOFRAN) 8 MG tablet Take 1 tablet (8 mg total) by mouth every 8 (eight) hours as needed for nausea or vomiting. 20 tablet Lyndee Hensen, DO      PDMP not reviewed this encounter.   Lyndee Hensen, DO 07/21/22 2124

## 2022-08-12 ENCOUNTER — Other Ambulatory Visit: Payer: Self-pay | Admitting: Adult Health

## 2022-08-14 DIAGNOSIS — M47812 Spondylosis without myelopathy or radiculopathy, cervical region: Secondary | ICD-10-CM | POA: Diagnosis not present

## 2022-08-14 DIAGNOSIS — M5412 Radiculopathy, cervical region: Secondary | ICD-10-CM | POA: Diagnosis not present

## 2022-09-04 DIAGNOSIS — Z6832 Body mass index (BMI) 32.0-32.9, adult: Secondary | ICD-10-CM | POA: Diagnosis not present

## 2022-09-04 DIAGNOSIS — M542 Cervicalgia: Secondary | ICD-10-CM | POA: Diagnosis not present

## 2022-09-23 DIAGNOSIS — M542 Cervicalgia: Secondary | ICD-10-CM | POA: Diagnosis not present

## 2022-09-23 DIAGNOSIS — M50223 Other cervical disc displacement at C6-C7 level: Secondary | ICD-10-CM | POA: Diagnosis not present

## 2022-10-01 DIAGNOSIS — M542 Cervicalgia: Secondary | ICD-10-CM | POA: Diagnosis not present

## 2022-10-22 DIAGNOSIS — M47812 Spondylosis without myelopathy or radiculopathy, cervical region: Secondary | ICD-10-CM | POA: Diagnosis not present

## 2022-12-25 DIAGNOSIS — E049 Nontoxic goiter, unspecified: Secondary | ICD-10-CM | POA: Diagnosis not present

## 2022-12-25 DIAGNOSIS — Z0000 Encounter for general adult medical examination without abnormal findings: Secondary | ICD-10-CM | POA: Diagnosis not present

## 2022-12-25 DIAGNOSIS — Z6832 Body mass index (BMI) 32.0-32.9, adult: Secondary | ICD-10-CM | POA: Diagnosis not present

## 2022-12-25 DIAGNOSIS — Z113 Encounter for screening for infections with a predominantly sexual mode of transmission: Secondary | ICD-10-CM | POA: Diagnosis not present

## 2022-12-25 DIAGNOSIS — Z13220 Encounter for screening for lipoid disorders: Secondary | ICD-10-CM | POA: Diagnosis not present

## 2022-12-25 DIAGNOSIS — E669 Obesity, unspecified: Secondary | ICD-10-CM | POA: Diagnosis not present

## 2022-12-28 ENCOUNTER — Other Ambulatory Visit: Payer: Self-pay | Admitting: Family Medicine

## 2022-12-28 DIAGNOSIS — E049 Nontoxic goiter, unspecified: Secondary | ICD-10-CM

## 2023-01-08 ENCOUNTER — Ambulatory Visit (HOSPITAL_BASED_OUTPATIENT_CLINIC_OR_DEPARTMENT_OTHER): Payer: BC Managed Care – PPO

## 2023-01-08 ENCOUNTER — Telehealth (INDEPENDENT_AMBULATORY_CARE_PROVIDER_SITE_OTHER): Payer: BC Managed Care – PPO | Admitting: Adult Health

## 2023-01-08 ENCOUNTER — Encounter (HOSPITAL_BASED_OUTPATIENT_CLINIC_OR_DEPARTMENT_OTHER): Payer: Self-pay

## 2023-01-08 DIAGNOSIS — G43109 Migraine with aura, not intractable, without status migrainosus: Secondary | ICD-10-CM | POA: Diagnosis not present

## 2023-01-08 MED ORDER — SUMATRIPTAN 20 MG/ACT NA SOLN: 20.0000 mg | NASAL | 5 refills | Status: DC | PRN

## 2023-01-08 MED ORDER — NORTRIPTYLINE HCL 10 MG PO CAPS: ORAL_CAPSULE | ORAL | 3 refills | Status: DC
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
## 2023-01-14 DIAGNOSIS — E041 Nontoxic single thyroid nodule: Secondary | ICD-10-CM | POA: Diagnosis not present

## 2023-01-14 DIAGNOSIS — E049 Nontoxic goiter, unspecified: Secondary | ICD-10-CM | POA: Diagnosis not present

## 2023-01-21 DIAGNOSIS — M5412 Radiculopathy, cervical region: Secondary | ICD-10-CM | POA: Diagnosis not present

## 2023-02-09 ENCOUNTER — Other Ambulatory Visit: Payer: Self-pay | Admitting: Family Medicine

## 2023-02-09 DIAGNOSIS — E042 Nontoxic multinodular goiter: Secondary | ICD-10-CM

## 2023-03-04 ENCOUNTER — Other Ambulatory Visit (HOSPITAL_COMMUNITY)
Admission: RE | Admit: 2023-03-04 | Discharge: 2023-03-04 | Disposition: A | Discharge status: Home / Self Care | Payer: BC Managed Care – PPO | Source: Ambulatory Visit | Attending: Family Medicine | Admitting: Family Medicine

## 2023-03-04 ENCOUNTER — Ambulatory Visit
Admission: RE | Admit: 2023-03-04 | Discharge: 2023-03-04 | Disposition: A | Discharge status: Home / Self Care | Payer: BC Managed Care – PPO | Source: Ambulatory Visit | Attending: Family Medicine | Admitting: Family Medicine

## 2023-03-04 DIAGNOSIS — E042 Nontoxic multinodular goiter: Secondary | ICD-10-CM | POA: Diagnosis not present

## 2023-03-04 DIAGNOSIS — E041 Nontoxic single thyroid nodule: Secondary | ICD-10-CM | POA: Diagnosis not present

## 2023-03-08 LAB — CYTOLOGY - NON PAP

## 2023-03-26 DIAGNOSIS — Z3202 Encounter for pregnancy test, result negative: Secondary | ICD-10-CM | POA: Diagnosis not present

## 2023-03-26 DIAGNOSIS — N3000 Acute cystitis without hematuria: Secondary | ICD-10-CM | POA: Diagnosis not present

## 2023-03-26 DIAGNOSIS — R350 Frequency of micturition: Secondary | ICD-10-CM | POA: Diagnosis not present

## 2023-04-16 DIAGNOSIS — N898 Other specified noninflammatory disorders of vagina: Secondary | ICD-10-CM | POA: Diagnosis not present

## 2023-04-16 DIAGNOSIS — Z01419 Encounter for gynecological examination (general) (routine) without abnormal findings: Secondary | ICD-10-CM | POA: Diagnosis not present

## 2023-04-16 DIAGNOSIS — Z113 Encounter for screening for infections with a predominantly sexual mode of transmission: Secondary | ICD-10-CM | POA: Diagnosis not present

## 2023-04-16 DIAGNOSIS — Z1151 Encounter for screening for human papillomavirus (HPV): Secondary | ICD-10-CM | POA: Diagnosis not present

## 2023-04-16 DIAGNOSIS — Z124 Encounter for screening for malignant neoplasm of cervix: Secondary | ICD-10-CM | POA: Diagnosis not present

## 2023-04-25 ENCOUNTER — Encounter: Payer: Self-pay | Admitting: Emergency Medicine

## 2023-04-25 ENCOUNTER — Ambulatory Visit
Admission: EM | Admit: 2023-04-25 | Discharge: 2023-04-25 | Disposition: A | Discharge status: Home / Self Care | Payer: BC Managed Care – PPO | Attending: Emergency Medicine | Admitting: Emergency Medicine

## 2023-04-25 DIAGNOSIS — B9689 Other specified bacterial agents as the cause of diseases classified elsewhere: Secondary | ICD-10-CM | POA: Diagnosis not present

## 2023-04-25 DIAGNOSIS — N760 Acute vaginitis: Secondary | ICD-10-CM | POA: Insufficient documentation

## 2023-04-25 LAB — URINALYSIS, W/ REFLEX TO CULTURE (INFECTION SUSPECTED)
Bilirubin Urine: NEGATIVE
Glucose, UA: NEGATIVE mg/dL
Ketones, ur: NEGATIVE mg/dL
Leukocytes,Ua: NEGATIVE
Nitrite: NEGATIVE
Protein, ur: NEGATIVE mg/dL
Specific Gravity, Urine: 1.015 (ref 1.005–1.030)
pH: 6 (ref 5.0–8.0)

## 2023-04-25 LAB — WET PREP, GENITAL
Sperm: NONE SEEN
Trich, Wet Prep: NONE SEEN
WBC, Wet Prep HPF POC: <10 — AB (ref <10)
Yeast Wet Prep HPF POC: NONE SEEN

## 2023-04-25 LAB — PREGNANCY, URINE: Preg Test, Ur: NEGATIVE

## 2023-04-25 MED ORDER — CLINDAMYCIN HCL 300 MG PO CAPS: 300.0000 mg | ORAL_CAPSULE | Freq: Two times a day (BID) | ORAL | 0 refills | Status: AC

## 2023-04-25 NOTE — ED Provider Notes (Signed)
[facility]    CSN: [id number] Arrival date & time: 04/25/23  1259      History   Chief Complaint Chief Complaint  Patient presents with   Urinary Frequency   Fatigue    HPI Melissa Boyd is a 27 y.o. female.   HPI  27 year old female with a past medical history significant for DVT and migraine headaches presents for evaluation of 1 month worth of urinary frequency, fatigue, and bladder pressure.  She is also had some mild nausea.  She denies any fever, belly pain, or blood in her urine.  No vaginal discharge or itching.  She recently was treated by her OB/GYN for bacterial vaginosis and finished her Flagyl 3 days ago.  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   Other allergic rhinitis 11/13/2020   Adverse reaction to food, subsequent encounter 11/13/2020   Numbness and tingling of right side of face 08/01/2020   Palpitations 09/10/2015   Idiopathic urticaria 07/01/2015   Food allergy 07/01/2015   Allergic rhinitis 06/28/2015   Subjective visual disturbance of left eye 05/29/2015   Neck pain on left side 05/27/2015   History of deep vein thrombosis of lower extremity 11/05/2014   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   Awareness of heartbeats 08/07/2014   Paroxysmal supraventricular tachycardia 08/07/2014   Breathlessness on exertion 08/07/2014   Migraine with aura 12/08/2012   Migraine variant 12/08/2012   Migraine without aura and without status migrainosus, not intractable 12/08/2012   Episodic tension type headache 12/08/2012   Ovarian cyst 09/01/2012   Migraines 04/18/2012   Headache, migraine 04/18/2012    Past Surgical History:  Procedure Laterality Date   SHOULDER SURGERY     left    WISDOM TOOTH EXTRACTION      OB History     Gravida  0   Para      Term      Preterm      AB      Living         SAB      IAB      Ectopic      Multiple      Live Births               Home Medications    Prior to Admission medications   Medication Sig Start Date End Date Taking? Authorizing Provider  clindamycin (CLEOCIN) 300 MG capsule Take 1 capsule (300 mg total) by mouth 2 (two) times daily for 7 days. 04/25/23 05/02/23 Yes Becky Augusta, NP  nortriptyline (PAMELOR) 10 MG capsule TAKE 1 CAPSULE BY MOUTH EVERYDAY AT BEDTIME 01/08/23  Yes Millikan, Megan, NP  clindamycin (CLEOCIN T) 1 % lotion clindamycin 1 % lotion  APPLY DAILY TO SKIN TO AFFECTED AREA EVERY DAY FOR 30 DAYS    [provider]  Lucila Maine 575 MG/5ML SYRP     [provider]  EPINEPHrine (AUVI-Q) 0.3 mg/0.3 mL IJ SOAJ injection Use as directed for severe allergic reactions 11/13/20   Ellamae Sia, DO  Multiple Vitamins-Minerals (MULTIVITAMIN ADULT EXTRA C PO)     [provider]  ondansetron (ZOFRAN) 8 MG tablet Take 1 tablet (8 mg total) by mouth  every 8 (eight) hours as needed for nausea or vomiting. 07/21/22   Katha Cabal, DO  SUMAtriptan (IMITREX) 20 MG/ACT nasal spray Place 1 spray (20 mg total) into the nose every 2 (two) hours as needed for migraine or headache. May repeat in 2 hours if headache persists or recurs. 01/08/23   Butch Penny, NP  tretinoin (RETIN-A) 0.05 % cream tretinoin 0.05 % topical cream  APPLY TO FACE ONCE A DAY IN THE EVENING    [provider]    Family History Family History  Problem Relation Age of Onset   Anemia Father        low iron    Alzheimer's disease Paternal Grandfather    Migraines Brother    Hypertension Maternal Aunt    Migraines Cousin        Mother's 1st cousin; has them really bad    Migraines Cousin     Social History Social History   Tobacco Use   Smoking status: Never   Smokeless tobacco: Never   Vaping Use   Vaping status: Never Used  Substance Use Topics   Alcohol use: Yes    Comment: maybe once a month   Drug use: No     Allergies   Other, Shellfish allergy, Naproxen, Amoxicillin, and Doxycycline   Review of Systems Review of Systems  Constitutional:  Positive for fatigue. Negative for fever.  Gastrointestinal:  Negative for abdominal pain.  Genitourinary:  Positive for frequency and urgency. Negative for dysuria, hematuria, vaginal discharge and vaginal pain.  Musculoskeletal:  Negative for back pain.     Physical Exam Triage Vital Signs ED Triage Vitals  Encounter Vitals Group     BP      Systolic BP Percentile      Diastolic BP Percentile      Pulse      Resp      Temp      Temp src      SpO2      Weight      Height      Head Circumference      Peak Flow      Pain Score      Pain Loc      Pain Education      Exclude from Growth Chart    No data found.  Updated Vital Signs BP (!) 152/85 (BP Location: Left Arm)   Pulse 86   Temp 98.4 F (36.9 C) (Oral)   Resp 14   Ht 5' 7.5" (1.715 m)   Wt 203 lb (92.1 kg)   LMP 04/03/2023 (Approximate)   SpO2 100%   BMI 31.33 kg/m   Visual Acuity Right Eye Distance:   Left Eye Distance:   Bilateral Distance:    Right Eye Near:   Left Eye Near:    Bilateral Near:     Physical Exam Vitals and nursing note reviewed.  Constitutional:      Appearance: Normal appearance. She is not ill-appearing.  HENT:     Head: Normocephalic and atraumatic.  Cardiovascular:     Rate and Rhythm: Normal rate and regular rhythm.     Pulses: Normal pulses.     Heart sounds: Normal heart sounds. No murmur heard.    No friction rub. No gallop.  Pulmonary:     Effort: Pulmonary effort is normal.     Breath sounds: Normal breath sounds. No wheezing, rhonchi or rales.  Abdominal:     Tenderness: There is no right CVA tenderness  or left CVA tenderness.  Skin:    General: Skin is warm and dry.     Capillary Refill:  Capillary refill takes less than 2 seconds.     Findings: No erythema or rash.  Neurological:     General: No focal deficit present.     Mental Status: She is alert and oriented to person, place, and time.      UC Treatments / Results  Labs (all labs ordered are listed, but only abnormal results are displayed) Labs Reviewed  WET PREP, GENITAL - Abnormal; Notable for the following components:      Result Value   Clue Cells Wet Prep HPF POC PRESENT (*)    WBC, Wet Prep HPF POC <10 (*)    All other components within normal limits  URINALYSIS, W/ REFLEX TO CULTURE (INFECTION SUSPECTED) - Abnormal; Notable for the following components:   Hgb urine dipstick TRACE (*)    Bacteria, UA FEW (*)    All other components within normal limits  PREGNANCY, URINE    EKG   Radiology No results found.  Procedures Procedures (including critical care time)  Medications Ordered in UC Medications - No data to display  Initial Impression / Assessment and Plan / UC Course  I have reviewed the triage vital signs and the nursing notes.  Pertinent labs & imaging results that were available during my care of the patient were reviewed by me and considered in my medical decision making (see chart for details).   Patient is a nontoxic-appearing 27 year old female presenting for evaluation of 1 month worth of urinary symptoms as outlined HPI above.  She is not in any acute distress.  She does not had a fever at home and she is afebrile in clinic with a temperature of 98.4.  She was recently treated for bacterial vaginosis and states that she did not have any vaginal discharge or odor.  She states her discharge is normal egg white consistency currently.  She has had bladder pressure along with urinary frequency but no pain with urination.  I will order a urinalysis to evaluate for the presence of UTI as well as vaginal wet prep to look for BV or vaginal yeast infection.  Patient wet prep is positive for  clue cells but negative for trichomoniasis or yeast.  Urine pregnancy test is negative.  Urinalysis shows trace hemoglobin but is negative for leukocyte esterase, nitrates, or protein.  Reflex microscopy shows few bacteria but is otherwise benign.  I will discharge patient home with a diagnosis of bacterial vaginosis.  She just completed treatment with Flagyl so I will discharge her home on clindamycin 300 mg twice daily for 7 days for treatment of bacterial vaginosis.   Final Clinical Impressions(s) / UC Diagnoses   Final diagnoses:  BV (bacterial vaginosis)     Discharge Instructions      Take the clindamycin 300 mg twice daily for treatment of your bacterial vaginosis.  Bacterial vaginosis is often caused by a imbalance of bacteria in your vaginal vault.  This is sometimes a result of using tampons or hormonal fluctuations during her menstrual cycle.  You if your symptoms are recurrent you can try using a boric acid suppository twice weekly to help maintain the acid-base balance in your vagina vault which could prevent further infection.  You can also try vaginal probiotics to help return normal bacterial balance.      ED Prescriptions     Medication Sig Dispense Auth. Provider   clindamycin (  CLEOCIN) 300 MG capsule Take 1 capsule (300 mg total) by mouth 2 (two) times daily for 7 days. 14 capsule Becky Augusta, NP      PDMP not reviewed this encounter.   Becky Augusta, NP 04/25/23 1353

## 2023-04-25 NOTE — ED Triage Notes (Signed)
Patient states that she has had urinary frequency, fatigue, and pressure in her bladder that started a month ago.  Patient has a history of BV.  Patient last took Flagy for BV Thursday night (last dose).

## 2023-04-25 NOTE — Discharge Instructions (Addendum)
Take the clindamycin 300 mg twice daily for treatment of your bacterial vaginosis.  Bacterial vaginosis is often caused by a imbalance of bacteria in your vaginal vault.  This is sometimes a result of using tampons or hormonal fluctuations during her menstrual cycle.  You if your symptoms are recurrent you can try using a boric acid suppository twice weekly to help maintain the acid-base balance in your vagina vault which could prevent further infection.  You can also try vaginal probiotics to help return normal bacterial balance.

## 2023-04-26 ENCOUNTER — Ambulatory Visit: Payer: BC Managed Care – PPO

## 2023-05-10 DIAGNOSIS — R3989 Other symptoms and signs involving the genitourinary system: Secondary | ICD-10-CM | POA: Diagnosis not present

## 2023-05-10 DIAGNOSIS — R350 Frequency of micturition: Secondary | ICD-10-CM | POA: Diagnosis not present

## 2023-05-20 DIAGNOSIS — N898 Other specified noninflammatory disorders of vagina: Secondary | ICD-10-CM | POA: Diagnosis not present

## 2023-05-20 DIAGNOSIS — Z113 Encounter for screening for infections with a predominantly sexual mode of transmission: Secondary | ICD-10-CM | POA: Diagnosis not present

## 2023-05-20 DIAGNOSIS — R102 Pelvic and perineal pain: Secondary | ICD-10-CM | POA: Diagnosis not present

## 2023-05-20 DIAGNOSIS — R350 Frequency of micturition: Secondary | ICD-10-CM | POA: Diagnosis not present

## 2023-05-23 DIAGNOSIS — N83209 Unspecified ovarian cyst, unspecified side: Secondary | ICD-10-CM

## 2023-05-23 HISTORY — DX: Unspecified ovarian cyst, unspecified side: N83.209

## 2023-06-08 DIAGNOSIS — N926 Irregular menstruation, unspecified: Secondary | ICD-10-CM | POA: Diagnosis not present

## 2023-06-08 DIAGNOSIS — R102 Pelvic and perineal pain: Secondary | ICD-10-CM | POA: Diagnosis not present

## 2023-06-11 DIAGNOSIS — N926 Irregular menstruation, unspecified: Secondary | ICD-10-CM | POA: Diagnosis not present

## 2023-06-13 ENCOUNTER — Inpatient Hospital Stay (HOSPITAL_COMMUNITY): Payer: BC Managed Care – PPO

## 2023-06-13 ENCOUNTER — Encounter (HOSPITAL_COMMUNITY): Payer: Self-pay | Admitting: Obstetrics and Gynecology

## 2023-06-13 ENCOUNTER — Inpatient Hospital Stay (HOSPITAL_COMMUNITY)
Admission: AD | Admit: 2023-06-13 | Discharge: 2023-06-13 | Disposition: A | Discharge status: Home / Self Care | Payer: BC Managed Care – PPO | Attending: Obstetrics and Gynecology | Admitting: Obstetrics and Gynecology

## 2023-06-13 ENCOUNTER — Ambulatory Visit
Admission: EM | Admit: 2023-06-13 | Discharge: 2023-06-13 | Disposition: A | Discharge status: Home / Self Care | Payer: BC Managed Care – PPO | Attending: Emergency Medicine | Admitting: Emergency Medicine

## 2023-06-13 ENCOUNTER — Encounter: Payer: Self-pay | Admitting: *Deleted

## 2023-06-13 ENCOUNTER — Other Ambulatory Visit: Payer: Self-pay

## 2023-06-13 DIAGNOSIS — R109 Unspecified abdominal pain: Secondary | ICD-10-CM | POA: Diagnosis not present

## 2023-06-13 DIAGNOSIS — G43909 Migraine, unspecified, not intractable, without status migrainosus: Secondary | ICD-10-CM | POA: Insufficient documentation

## 2023-06-13 DIAGNOSIS — B9689 Other specified bacterial agents as the cause of diseases classified elsewhere: Secondary | ICD-10-CM | POA: Diagnosis not present

## 2023-06-13 DIAGNOSIS — N760 Acute vaginitis: Secondary | ICD-10-CM

## 2023-06-13 DIAGNOSIS — O23591 Infection of other part of genital tract in pregnancy, first trimester: Secondary | ICD-10-CM | POA: Insufficient documentation

## 2023-06-13 DIAGNOSIS — Z3A00 Weeks of gestation of pregnancy not specified: Secondary | ICD-10-CM | POA: Diagnosis not present

## 2023-06-13 DIAGNOSIS — Z3201 Encounter for pregnancy test, result positive: Secondary | ICD-10-CM | POA: Diagnosis not present

## 2023-06-13 DIAGNOSIS — O26899 Other specified pregnancy related conditions, unspecified trimester: Secondary | ICD-10-CM | POA: Diagnosis not present

## 2023-06-13 DIAGNOSIS — Z86718 Personal history of other venous thrombosis and embolism: Secondary | ICD-10-CM | POA: Diagnosis not present

## 2023-06-13 DIAGNOSIS — O3680X0 Pregnancy with inconclusive fetal viability, not applicable or unspecified: Secondary | ICD-10-CM | POA: Diagnosis not present

## 2023-06-13 DIAGNOSIS — Z3A01 Less than 8 weeks gestation of pregnancy: Secondary | ICD-10-CM

## 2023-06-13 DIAGNOSIS — O26891 Other specified pregnancy related conditions, first trimester: Secondary | ICD-10-CM | POA: Insufficient documentation

## 2023-06-13 LAB — CBC
HCT: 34.9 % — ABNORMAL LOW (ref 36.0–46.0)
Hemoglobin: 12.1 g/dL (ref 12.0–15.0)
MCH: 33.5 pg (ref 26.0–34.0)
MCHC: 34.7 g/dL (ref 30.0–36.0)
MCV: 96.7 fL (ref 80.0–100.0)
Platelets: 228 10*3/uL (ref 150–400)
RBC: 3.61 MIL/uL — ABNORMAL LOW (ref 3.87–5.11)
RDW: 11.8 % (ref 11.5–15.5)
WBC: 6.6 10*3/uL (ref 4.0–10.5)
nRBC: 0 % (ref 0.0–0.2)

## 2023-06-13 LAB — URINALYSIS, ROUTINE W REFLEX MICROSCOPIC
Bilirubin Urine: NEGATIVE
Glucose, UA: NEGATIVE mg/dL
Hgb urine dipstick: NEGATIVE
Ketones, ur: 20 mg/dL — AB
Leukocytes,Ua: NEGATIVE
Nitrite: NEGATIVE
Protein, ur: NEGATIVE mg/dL
Specific Gravity, Urine: 1.023 (ref 1.005–1.030)
pH: 6 (ref 5.0–8.0)

## 2023-06-13 LAB — WET PREP, GENITAL
Sperm: NONE SEEN
Trich, Wet Prep: NONE SEEN
WBC, Wet Prep HPF POC: >10 — AB (ref <10)
Yeast Wet Prep HPF POC: NONE SEEN

## 2023-06-13 LAB — URINALYSIS, W/ REFLEX TO CULTURE (INFECTION SUSPECTED)
Bilirubin Urine: NEGATIVE
Glucose, UA: NEGATIVE mg/dL
Ketones, ur: NEGATIVE mg/dL
Leukocytes,Ua: NEGATIVE
Nitrite: NEGATIVE
Protein, ur: NEGATIVE mg/dL
Specific Gravity, Urine: <1.005 — ABNORMAL LOW (ref 1.005–1.030)
pH: 6 (ref 5.0–8.0)

## 2023-06-13 LAB — HIV ANTIBODY (ROUTINE TESTING W REFLEX): HIV Screen 4th Generation wRfx: NONREACTIVE

## 2023-06-13 LAB — ABO/RH: ABO/RH(D): B POS

## 2023-06-13 LAB — HCG, QUANTITATIVE, PREGNANCY: hCG, Beta Chain, Quant, S: 6866 m[IU]/mL — ABNORMAL HIGH (ref <5)

## 2023-06-13 LAB — PREGNANCY, URINE: Preg Test, Ur: POSITIVE — AB

## 2023-06-13 MED ORDER — FOLIVANE-OB 85-1 MG PO CAPS: 1.0000 | ORAL_CAPSULE | Freq: Every day | ORAL | 12 refills | Status: DC

## 2023-06-13 MED ORDER — METRONIDAZOLE 500 MG PO TABS: 500.0000 mg | ORAL_TABLET | Freq: Two times a day (BID) | ORAL | 0 refills | Status: DC

## 2023-06-13 NOTE — MAU Provider Note (Signed)
Chief Complaint: No chief complaint on file.   None    SUBJECTIVE HPI: Melissa Boyd is a 27 y.o. G1P0 at Unknown who presents to Maternity Admissions reporting:  Vaginal Bleeding: *** Passage of tissue or clots: *** Dizziness: ***  ***  Pain Location: *** Quality: *** Severity: ***/10 on pain scale Duration: *** Course: *** Context: *** Timing: *** Modifying factors: *** Associated signs and symptoms: ***  Past Medical History:  Diagnosis Date  . Allergy    Phreesia 04/29/2020  . DVT (deep venous thrombosis) (HCC)   . Headache, migraine   . Ovarian cyst 05/23/2023  . Urticaria    OB History  Gravida Para Term Preterm AB Living  1            SAB IAB Ectopic Multiple Live Births               # Outcome Date GA Lbr Len/2nd Weight Sex Type Anes PTL Lv  1 Current            Past Surgical History:  Procedure Laterality Date  . SHOULDER SURGERY     left  . WISDOM TOOTH EXTRACTION     Social History   Socioeconomic History  . Marital status: Single    Spouse name: Not on file  . Number of children: Not on file  . Years of education: Not on file  . Highest education level: Not on file  Occupational History  . Not on file  Tobacco Use  . Smoking status: Never  . Smokeless tobacco: Never  Vaping Use  . Vaping status: Never Used  Substance and Sexual Activity  . Alcohol use: Not Currently  . Drug use: No  . Sexual activity: Yes  Other Topics Concern  . 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 She enjoys track and field.   Lives in [place].    Right handed   Caffeine: maybe once a week. Neita Carp)      Social Determinants of Health   Financial Resource Strain: Not on file  Food Insecurity: Not on file  Transportation Needs: Not on file  Physical Activity: Not on file  Stress: Not on file  Social Connections: Not on file  Intimate  Partner Violence: Not on file   No current facility-administered medications on file prior to encounter.   Current Outpatient Medications on File Prior to Encounter  Medication Sig Dispense Refill  . clindamycin (CLEOCIN T) 1 % lotion clindamycin 1 % lotion  APPLY DAILY TO SKIN TO AFFECTED AREA EVERY DAY FOR 30 DAYS    . Elderberry 575 MG/5ML SYRP     . EPINEPHrine (AUVI-Q) 0.3 mg/0.3 mL IJ SOAJ injection Use as directed for severe allergic reactions 4 each 3  . Multiple Vitamins-Minerals (MULTIVITAMIN ADULT EXTRA C PO)     . nortriptyline (PAMELOR) 10 MG capsule TAKE 1 CAPSULE BY MOUTH EVERYDAY AT BEDTIME 90 capsule 3  . ondansetron (ZOFRAN) 8 MG tablet Take 1 tablet (8 mg total) by mouth every 8 (eight) hours as needed for nausea or vomiting. 20 tablet 0  . SUMAtriptan (IMITREX) 20 MG/ACT nasal spray Place 1 spray (20 mg total) into the nose every 2 (two) hours as needed for migraine or headache. May repeat in 2 hours if headache persists or recurs. 1 each 5  . tretinoin (RETIN-A) 0.05 % cream tretinoin 0.05 % topical cream  APPLY TO FACE ONCE A DAY IN THE EVENING     Allergies  Allergen Reactions  . Other Anaphylaxis    Any products that contain shellfish  . Shellfish Allergy Anaphylaxis  . Naproxen Nausea And Vomiting  . Amoxicillin Other (See Comments)  . Doxycycline Other (See Comments)    I have reviewed the past Medical Hx, Surgical Hx, Social Hx, Allergies and Medications.   Review of Systems  OBJECTIVE No data found. Constitutional: Well-developed, well-nourished female in no acute distress.  Cardiovascular: normal rate Respiratory: normal rate and effort.  GI: Abd soft, non-tender. Pos BS x 4 MS: Extremities nontender, no edema, normal ROM Neurologic: Alert and oriented x 4.  GU: Neg CVAT.  SPECULUM EXAM: NEFG, physiologic discharge, no blood noted, cervix clean  BIMANUAL: cervix ***; uterus normal size, no adnexal tenderness or masses.  No CMT.  LAB  RESULTS Results for orders placed or performed during the hospital encounter of 06/13/23 (from the past 24 hour(s))  Pregnancy, urine     Status: Abnormal   Collection Time: 06/13/23 11:34 AM  Result Value Ref Range   Preg Test, Ur POSITIVE (A) NEGATIVE  Urinalysis, w/ Reflex to Culture (Infection Suspected) -Urine, Clean Catch     Status: Abnormal   Collection Time: 06/13/23 11:34 AM  Result Value Ref Range   Specimen Source URINE, CLEAN CATCH    Color, Urine YELLOW YELLOW   APPearance HAZY (A) CLEAR   Specific Gravity, Urine <1.005 (L) 1.005 - 1.030   pH 6.0 5.0 - 8.0   Glucose, UA NEGATIVE NEGATIVE mg/dL   Hgb urine dipstick TRACE (A) NEGATIVE   Bilirubin Urine NEGATIVE NEGATIVE   Ketones, ur NEGATIVE NEGATIVE mg/dL   Protein, ur NEGATIVE NEGATIVE mg/dL   Nitrite NEGATIVE NEGATIVE   Leukocytes,Ua NEGATIVE NEGATIVE   Squamous Epithelial / HPF 21-50 0 - 5 /HPF   WBC, UA 0-5 0 - 5 WBC/hpf   RBC / HPF 0-5 0 - 5 RBC/hpf   Bacteria, UA MANY (A) NONE SEEN  Wet prep, genital     Status: Abnormal   Collection Time: 06/13/23 11:50 AM  Result Value Ref Range   Yeast Wet Prep HPF POC NONE SEEN NONE SEEN   Trich, Wet Prep NONE SEEN NONE SEEN   Clue Cells Wet Prep HPF POC PRESENT (A) NONE SEEN   WBC, Wet Prep HPF POC >10 (A) <10   Sperm NONE SEEN     IMAGING No results found.  MAU COURSE CBC, Quant, ABO/Rh, ultrasound, wet prep and GC/chlamydia culture, UA  MDM Pain and bleeding in early pregnancy with normal intrauterine pregnancy and hemodynamically stable.  Pain and bleeding in early pregnancy with pregnancy of unknown anatomic location, but hemodynamically stable.  Plain and bleeding in early pregnancy with pregnancy of unknown anatomic location and hemodynamically unstable.  ASSESSMENT No diagnosis found.  PLAN Discharge home in stable condition. *** precautions  Allergies as of 06/13/2023       Reactions   Other Anaphylaxis   Any products that contain shellfish    Shellfish Allergy Anaphylaxis   Naproxen Nausea And Vomiting   Amoxicillin Other (See Comments)   Doxycycline Other (See Comments)     Med Rec must be completed prior to using this Bobetta Lime, [place], CNM 06/13/2023  3:52 PM  4

## 2023-06-13 NOTE — ED Notes (Signed)
Patient is being discharged from the Urgent Care and sent to the Emergency Department via POV . Per Betsey Holiday, patient is in need of higher level of care due to Abdominal pain and pregnancy. Patient is aware and verbalizes understanding of plan of care.  Vitals:   06/13/23 1146  BP: 115/81  Pulse: 82  Resp: 18  Temp: 99.2 F (37.3 C)  SpO2: 100%

## 2023-06-13 NOTE — ED Triage Notes (Signed)
Patient states weeks of abdominal pain recently diagnosed ovarian cysts, more intense pain/cramping in the past few days, + home pregnancy test today.  No bleeding

## 2023-06-13 NOTE — Discharge Instructions (Signed)
Decrease Nortriptyline to 5 mg per day x 7 days then try stopping it. If you notice any flu-like symptoms, irritability or headache then you may need to wean down more slowly, taking 2.5 mg per day x 7 days before stopping. Discuss weaning and alternative treatments with your neurologist.

## 2023-06-13 NOTE — ED Provider Notes (Signed)
[facility]    CSN: [id number] Arrival date & time: 06/13/23  1115      History   Chief Complaint Chief Complaint  Patient presents with   Abdominal Pain    HPI Stefhanie Kachmar is a 27 y.o. female.   [name], 27 year old female, presents to the urgent care for evaluation of abdominal pain, recently diagnosed with ovarian cysts, more intense pain/cramping in past few days, + home preg test today. No bleeding. LMP was 08/24.   The history is provided by the patient. No language interpreter was used.    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   Other allergic rhinitis 11/13/2020   Adverse reaction to food, subsequent encounter 11/13/2020   Numbness and tingling of right side of face 08/01/2020   Palpitations 09/10/2015   Idiopathic urticaria 07/01/2015   Food allergy 07/01/2015   Allergic rhinitis 06/28/2015   Subjective visual disturbance of left eye 05/29/2015   Neck pain on left side 05/27/2015   History of deep vein thrombosis of lower extremity 11/05/2014   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   Awareness of heartbeats 08/07/2014   Paroxysmal supraventricular tachycardia 08/07/2014   Breathlessness on exertion 08/07/2014   Migraine with aura 12/08/2012   Migraine variant 12/08/2012   Migraine without aura and without status migrainosus, not intractable 12/08/2012   Episodic tension type headache 12/08/2012   Ovarian cyst 09/01/2012   Migraines 04/18/2012   Headache, migraine 04/18/2012    Past Surgical  History:  Procedure Laterality Date   SHOULDER SURGERY     left   WISDOM TOOTH EXTRACTION      OB History     Gravida  1   Para      Term      Preterm      AB      Living         SAB      IAB      Ectopic      Multiple      Live Births               Home Medications    Prior to Admission medications   Medication Sig Start Date End Date Taking? Authorizing Provider  clindamycin (CLEOCIN T) 1 % lotion clindamycin 1 % lotion  APPLY DAILY TO SKIN TO AFFECTED AREA EVERY DAY FOR 30 DAYS    [provider]  Lucila Maine 575 MG/5ML SYRP     [provider]  EPINEPHrine (AUVI-Q) 0.3 mg/0.3 mL IJ SOAJ injection Use as directed for severe allergic reactions 11/13/20   Ellamae Sia, DO  Multiple Vitamins-Minerals (MULTIVITAMIN ADULT EXTRA C PO)     [provider]  nortriptyline (PAMELOR) 10 MG capsule TAKE 1 CAPSULE BY MOUTH EVERYDAY AT BEDTIME 01/08/23   Butch Penny, NP  ondansetron (ZOFRAN) 8 MG tablet Take 1 tablet (8 mg total) by mouth every 8 (eight) hours as needed for nausea or vomiting. 07/21/22   Katha Cabal, DO  SUMAtriptan (IMITREX) 20 MG/ACT  nasal spray Place 1 spray (20 mg total) into the nose every 2 (two) hours as needed for migraine or headache. May repeat in 2 hours if headache persists or recurs. 01/08/23   Butch Penny, NP  tretinoin (RETIN-A) 0.05 % cream tretinoin 0.05 % topical cream  APPLY TO FACE ONCE A DAY IN THE EVENING    [provider]    Family History Family History  Problem Relation Age of Onset   Anemia Father        low iron    Alzheimer's disease Paternal Grandfather    Migraines Brother    Hypertension Maternal Aunt    Migraines Cousin        Mother's 1st cousin; has them really bad    Migraines Cousin     Social History Social History   Tobacco Use   Smoking status: Never   Smokeless tobacco: Never  Vaping Use   Vaping status: Never Used  Substance Use Topics   Alcohol use:  Not Currently   Drug use: No     Allergies   Other, Shellfish allergy, Naproxen, Amoxicillin, and Doxycycline   Review of Systems Review of Systems  Constitutional:  Negative for fever.  Gastrointestinal:  Positive for abdominal pain.  Genitourinary:  Negative for vaginal bleeding.  All other systems reviewed and are negative.    Physical Exam Triage Vital Signs ED Triage Vitals  Encounter Vitals Group     BP      Systolic BP Percentile      Diastolic BP Percentile      Pulse      Resp      Temp      Temp src      SpO2      Weight      Height      Head Circumference      Peak Flow      Pain Score      Pain Loc      Pain Education      Exclude from Growth Chart    No data found.  Updated Vital Signs BP 115/81 (BP Location: Left Arm)   Pulse 82   Temp 99.2 F (37.3 C) (Oral)   Resp 18   Ht 5' 7.5" (1.715 m)   Wt 204 lb (92.5 kg)   LMP 05/08/2023 (Exact Date) Comment: + home pregnancy test 9/29  SpO2 100%   BMI 31.48 kg/m   Visual Acuity Right Eye Distance:   Left Eye Distance:   Bilateral Distance:    Right Eye Near:   Left Eye Near:    Bilateral Near:     Physical Exam Vitals and nursing note reviewed.  Constitutional:      Appearance: She is well-developed and well-groomed.  HENT:     Head: Normocephalic.  Cardiovascular:     Rate and Rhythm: Normal rate and regular rhythm.  Pulmonary:     Effort: Pulmonary effort is normal.  Genitourinary:    Comments: Pt self swabbed Neurological:     General: No focal deficit present.     Mental Status: She is alert and oriented to person, place, and time.     GCS: GCS eye subscore is 4. GCS verbal subscore is 5. GCS motor subscore is 6.  Psychiatric:        Behavior: Behavior is cooperative.      UC Treatments / Results  Labs (all labs ordered are listed, but only abnormal results are displayed) Labs Reviewed  WET PREP, GENITAL - Abnormal; Notable for the following components:      Result  Value   Clue Cells Wet Prep HPF POC PRESENT (*)    WBC, Wet Prep HPF POC >10 (*)    All other components within normal limits  PREGNANCY, URINE - Abnormal; Notable for the following components:   Preg Test, Ur POSITIVE (*)    All other components within normal limits  URINALYSIS, W/ REFLEX TO CULTURE (INFECTION SUSPECTED) - Abnormal; Notable for the following components:   APPearance HAZY (*)    Specific Gravity, Urine <1.005 (*)    Hgb urine dipstick TRACE (*)    Bacteria, UA MANY (*)    All other components within normal limits    EKG   Radiology No results found.  Procedures Procedures (including critical care time)  Medications Ordered in UC Medications - No data to display  Initial Impression / Assessment and Plan / UC Course  I have reviewed the triage vital signs and the nursing notes.  Pertinent labs & imaging results that were available during my care of the patient were reviewed by me and considered in my medical decision making (see chart for details).  Clinical Course as of 06/13/23 1257  Sun Jun 13, 2023  1149 UA contaminated with 21-50 squamous, trace hgb [JD]    Clinical Course User Index [JD] Kinnedy Mongiello, Para March, NP    Ddx: Abdominal pain in pregnancy, BV, ectopic pregnancy, ovarian cyst(ruptured) Final Clinical Impressions(s) / UC Diagnoses   Final diagnoses:  Pregnancy confirmed by positive urine test  Abdominal pain during pregnancy, antepartum  BV (bacterial vaginosis)     Discharge Instructions      Your pregnancy test is positive. You UA was negative for UTI, your wet prep is positive for BV. Please go to the Emergency room for further evaluation of your abdominal pain in pregnancy.        ED Prescriptions   None    PDMP not reviewed this encounter.   Clancy Gourd, NP 06/13/23 1257

## 2023-06-13 NOTE — MAU Note (Signed)
.  Melissa Boyd is a 27 y.o. at [redacted]w[redacted]d here in MAU reporting: lower abd cramping.  Pt states she was dx with an ovarian cyst on Friday and thought pain was r/t to cyst but then found out she was preg today.  Denies bleeding    Pain score: 4/10 Vitals:   06/13/23 1604  BP: 128/80  Pulse: 89  Resp: 16  Temp: 97.8 F (36.6 C)  SpO2: 100%      Lab orders placed from triage:  ua

## 2023-06-13 NOTE — Discharge Instructions (Addendum)
Your pregnancy test is positive. You UA was negative for UTI, your wet prep is positive for BV. Please go to the Emergency room for further evaluation of your abdominal pain in pregnancy.

## 2023-06-14 ENCOUNTER — Encounter: Payer: Self-pay | Admitting: Adult Health

## 2023-06-14 LAB — GC/CHLAMYDIA PROBE AMP (~~LOC~~) NOT AT ARMC
Chlamydia: NEGATIVE
Comment: NEGATIVE
Comment: NORMAL
Neisseria Gonorrhea: NEGATIVE

## 2023-06-14 LAB — RPR: RPR Ser Ql: NONREACTIVE

## 2023-06-14 NOTE — Telephone Encounter (Signed)
That weaning plan is fine.  Okay to schedule for [place] to discuss treatment options

## 2023-06-14 NOTE — Telephone Encounter (Signed)
From CNM at hospital:  Decrease Nortriptyline to 5 mg per day x 7 days then try stopping it. If you notice any flu-like symptoms, irritability or headache then you may need to wean down more slowly, taking 2.5 mg per day x 7 days before stopping. Discuss weaning and alternative treatments with your neurologist.

## 2023-06-15 ENCOUNTER — Other Ambulatory Visit (INDEPENDENT_AMBULATORY_CARE_PROVIDER_SITE_OTHER): Payer: BC Managed Care – PPO | Admitting: *Deleted

## 2023-06-15 ENCOUNTER — Other Ambulatory Visit: Payer: Self-pay

## 2023-06-15 VITALS — BP 118/65 | HR 80 | Ht 67.5 in | Wt 204.7 lb

## 2023-06-15 DIAGNOSIS — O3680X0 Pregnancy with inconclusive fetal viability, not applicable or unspecified: Secondary | ICD-10-CM

## 2023-06-15 DIAGNOSIS — Z3A01 Less than 8 weeks gestation of pregnancy: Secondary | ICD-10-CM

## 2023-06-15 LAB — BETA HCG QUANT (REF LAB): hCG Quant: 9966 m[IU]/mL

## 2023-06-15 NOTE — Telephone Encounter (Signed)
If she has not had a dose since Saturday and she is feeling okay with no withdrawal effects then we can keep her off of it

## 2023-06-15 NOTE — Progress Notes (Signed)
Here for stat bhcg. States she is still having the pain but is less than when she went to [place]. She denies bleeding. Explained we will draw the blood and call her with results and plan of care in  a few hours she voices understanding. Fletcher Ostermiller,RN 5:00 bhcg results of [phone] received and reviewed results , assessment and history with Dr. Crissie Reese. He confirms is appropriate rise and recommends repeat US no sooner than 14 days to confirm if it is a viable pregnancy. He also advises it seems not to be ectopic and is intrauterine but not sure if viable. I called [facility] and reviewed results and recommendations. I scheduled her for [place] for 06/30/23. We discussed she may pursue care with private ob and she will cancel appointment if she does. I also reviewed ectopic precautions. She voices understanding with results and plan.  Nancy Fetter

## 2023-06-17 ENCOUNTER — Telehealth (INDEPENDENT_AMBULATORY_CARE_PROVIDER_SITE_OTHER): Payer: BC Managed Care – PPO | Admitting: Adult Health

## 2023-06-17 DIAGNOSIS — G43009 Migraine without aura, not intractable, without status migrainosus: Secondary | ICD-10-CM | POA: Diagnosis not present
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
## 2023-06-23 ENCOUNTER — Telehealth: Payer: Self-pay | Admitting: General Practice

## 2023-06-23 ENCOUNTER — Encounter: Payer: Self-pay | Admitting: Adult Health

## 2023-06-23 ENCOUNTER — Encounter: Payer: Self-pay | Admitting: General Practice

## 2023-06-23 DIAGNOSIS — O219 Vomiting of pregnancy, unspecified: Secondary | ICD-10-CM

## 2023-06-23 MED ORDER — DOXYLAMINE-PYRIDOXINE 10-10 MG PO TBEC
DELAYED_RELEASE_TABLET | ORAL | 2 refills | Status: DC
Start: 2023-06-23 — End: 2023-07-28

## 2023-06-23 NOTE — Telephone Encounter (Signed)
Patient also sent this mychart message separately this AM:   "Hi Melissa Boyd, I have had 3 migraines this week. One Sunday one yesterday and one today. It is getting out of hand and I can't take being pregnant and having these migraines. I had to take sumatriptan the Tylenol did not help. What are my options pharmaceutical and Non-pharmaceutical ways to combat these migraines that are safe for my pregnancy.    Best, Melissa Boyd"

## 2023-06-23 NOTE — Telephone Encounter (Signed)
Patient called and left message on nurse voicemail line stating her nortriptyline was discontinued recently due to her being pregnant & since then she has had 3 migraines. She needs to know what medicine she can take. Also she has noticed new onset diarrhea and would like to know what to do about that as well.    Called patient stating I am returning her phone call. Recommended she reach out to her neurologist about alternative meds since they have been treating her for her migraines. Patient states they recommended she contact us but they did ask about propranolol. Discussed with Dr Alvester Morin who states propranolol is fine but the patient could also resume nortriptyline if she wanted. Discussed with patient and reviewed sources she could review as well and then make the best decision for herself. Told patient I will send her a mychart message with website references. Also recommended she bring this to her neurologist and discuss with them as well. Patient verbalized understanding and states she has been having diarrhea for the past 2 days and her bottom is starting to get irritated. Discussed she can try imodium and recommended increasing hydration, could try electrolytes, witch hazel or tucks pads as needed for relief, and BRAT diet. Patient verbalized understanding and also mentioned difficulty with nausea/vomiting. Discussed could try diclegis but it may not be approved by her insurance but I would go ahead and initiate a PA. Patient verbalized understanding. Discussed scheduling new OB appts next week when she's in our office.

## 2023-06-30 ENCOUNTER — Ambulatory Visit: Payer: BC Managed Care – PPO

## 2023-06-30 ENCOUNTER — Other Ambulatory Visit: Payer: Self-pay

## 2023-06-30 ENCOUNTER — Encounter: Payer: Self-pay | Admitting: General Practice

## 2023-06-30 DIAGNOSIS — Z3481 Encounter for supervision of other normal pregnancy, first trimester: Secondary | ICD-10-CM | POA: Diagnosis not present

## 2023-06-30 DIAGNOSIS — Z3A01 Less than 8 weeks gestation of pregnancy: Secondary | ICD-10-CM | POA: Diagnosis not present

## 2023-06-30 DIAGNOSIS — O3680X0 Pregnancy with inconclusive fetal viability, not applicable or unspecified: Secondary | ICD-10-CM

## 2023-07-02 ENCOUNTER — Telehealth: Payer: Self-pay | Admitting: Family Medicine

## 2023-07-02 DIAGNOSIS — Z86718 Personal history of other venous thrombosis and embolism: Secondary | ICD-10-CM

## 2023-07-02 DIAGNOSIS — O30041 Twin pregnancy, dichorionic/diamniotic, first trimester: Secondary | ICD-10-CM | POA: Insufficient documentation

## 2023-07-02 MED ORDER — ENOXAPARIN SODIUM 40 MG/0.4ML IJ SOSY: 40.0000 mg | PREFILLED_SYRINGE | INTRAMUSCULAR | 11 refills | Status: DC

## 2023-07-02 NOTE — Telephone Encounter (Signed)
Called patient and confirmed identity with two markers.  Discussed with patient her hx of DVT. Chart reviewed extensively, she had a provoked DVT in 2016, was on OCP's and took a 10 hour bus ride for an athletic event. Completed 3 months of Xarelto. Hypercoagulable workup was negative.   Discussed with her for now can do prophylactic dosing lovenox and refer to Heme for re-eval. She is in agreement with plan, rx sent to pharmacy.  Also discussed results of her recent US which showed di/di twins, otherwise normal findings.   Clinical staff please schedule patient for nurse visit in the next week to do lovenox injection teaching and assist with Heme referral.

## 2023-07-09 NOTE — Telephone Encounter (Signed)
MyChart message review shows patient has picked up Lovenox from pharmacy but was provided with poor education. Pt unavailable for nurse visit time offered and was instructed to return to pharmacy for teaching. Called pt to follow up on Lovenox. Planning to return to pharmacy today. Reviewed general admin instructions like hand hygiene, proper injection site location and cleaning. Offered nurse visit on Monday morning- pt will send MyChart message if unable to return to pharmacy or if teaching is not adequate. Also will send MyChart message with patient administration instruction video and brochure.   Called Hematology department and left VM for referral coordinator. Encouraged pt to notify our office if she doesn't receive a call to schedule an appt with Hematology.

## 2023-07-21 ENCOUNTER — Telehealth: Payer: BC Managed Care – PPO

## 2023-07-21 ENCOUNTER — Encounter: Payer: Self-pay | Admitting: Obstetrics and Gynecology

## 2023-07-21 DIAGNOSIS — R768 Other specified abnormal immunological findings in serum: Secondary | ICD-10-CM | POA: Insufficient documentation

## 2023-07-21 DIAGNOSIS — Z3481 Encounter for supervision of other normal pregnancy, first trimester: Secondary | ICD-10-CM

## 2023-07-21 DIAGNOSIS — Z3A10 10 weeks gestation of pregnancy: Secondary | ICD-10-CM | POA: Diagnosis not present

## 2023-07-21 DIAGNOSIS — O30041 Twin pregnancy, dichorionic/diamniotic, first trimester: Secondary | ICD-10-CM

## 2023-07-21 DIAGNOSIS — O0990 Supervision of high risk pregnancy, unspecified, unspecified trimester: Secondary | ICD-10-CM | POA: Insufficient documentation

## 2023-07-21 DIAGNOSIS — A6000 Herpesviral infection of urogenital system, unspecified: Secondary | ICD-10-CM | POA: Insufficient documentation

## 2023-07-21 HISTORY — DX: Other specified abnormal immunological findings in serum: R76.8

## 2023-07-21 NOTE — Patient Instructions (Signed)

## 2023-07-21 NOTE — Progress Notes (Signed)
New OB Intake  I connected with Melissa Boyd  on 07/21/23 at  2:15 PM EST by MyChart Video Visit and verified that I am speaking with the correct person using two identifiers. Nurse is located at [facility] and pt is located at home.  I discussed the limitations, risks, security and privacy concerns of performing an evaluation and management service by telephone and the availability of in person appointments. I also discussed with the patient that there may be a patient responsible charge related to this service. The patient expressed understanding and agreed to proceed.  I explained I am completing New OB Intake today. We discussed EDD of 02/12/2024, by Last Menstrual Period. Pt is G1P0. I reviewed her allergies, medications and Medical/Surgical/OB history.    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   Other allergic rhinitis 11/13/2020   Adverse reaction to food, subsequent encounter 11/13/2020   Numbness and tingling of right side of face 08/01/2020   Palpitations 09/10/2015   Idiopathic urticaria 07/01/2015   Food allergy 07/01/2015   Allergic rhinitis 06/28/2015   Subjective visual disturbance of left eye 05/29/2015   Neck pain on left side 05/27/2015   History of deep vein thrombosis of lower extremity 11/05/2014   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 Discussed the nature of our practice with multiple providers including residents and students. Due to the size of the practice, the delivering provider may not be the same as those providing prenatal care.   Patient is not interested in water birth. Offered upcoming OB visit with CNM to discuss further.  MyChart/Babyscripts MyChart access verified. I explained pt will have some visits in office and some virtually. Babyscripts instructions given and order placed. Patient verifies receipt of registration text/e-mail. Account successfully created and app downloaded.  Blood Pressure Cuff/Weight Scale Patient has private insurance; instructed to purchase blood pressure cuff and bring to first prenatal appt. Explained after first prenatal appt pt will check weekly and document in Babyscripts. Patient does have weight scale.  Anatomy US Explained first scheduled [place] will be around 19 weeks. Anatomy US scheduled for 09/10/23 at 10:30a.  Is patient a CenteringPregnancy candidate?     If accepted,    Is patient a Mom+Baby Combined Care candidate?  Not a candidate   If accepted, confirm patient does not intend to move from the area for at least 12 months, then notify Mom+Baby staff  Interested in [place]? If yes, send referral and doula dot phrase.   Is patient a candidate for Babyscripts Optimization? Yes  First visit review I reviewed new OB appt with patient. Explained pt will be seen by Dr.Duncan at first visit. Discussed [name] genetic screening with patient. Needs Panorama  and Horizon.. Routine prenatal labs  needed at [facility] OB visit.    Last Pap Diagnosis  Date Value Ref Range Status  02/20/2022 - Low grade squamous intraepithelial lesion (LSIL)  (A)  Final    Henrietta Dine, CMA 07/21/2023  3:14 PM

## 2023-07-28 DIAGNOSIS — R87619 Unspecified abnormal cytological findings in specimens from cervix uteri: Secondary | ICD-10-CM | POA: Insufficient documentation

## 2023-07-28 NOTE — Progress Notes (Unsigned)
History:   Melissa Boyd is a 27 y.o. G1P0 at [redacted]w[redacted]d by early ultrasound being seen today for her first obstetrical visit.   Patient {does/does not:19097} intend to breast feed.   Patient reports {sx:14538}.      HISTORY: OB History  Gravida Para Term Preterm AB Living  1 0 0 0 0 0  SAB IAB Ectopic Multiple Live Births  0 0 0 1 0    # Outcome Date GA Lbr Len/2nd Weight Sex Type Anes PTL Lv  1 Current              Lab Results  Component Value Date   DIAGPAP - Low grade squamous intraepithelial lesion (LSIL) (A) 02/20/2022   DIAGPAP (A) 10/26/2019    - Atypical squamous cells of undetermined significance (ASC-US)   HPVHIGH Positive (A) 02/20/2022   HPVHIGH Positive (A) 10/26/2019     Past Medical History:  Diagnosis Date   Allergy    Phreesia 04/29/2020   DVT (deep venous thrombosis) (HCC)    Headache, migraine    Ovarian cyst 05/23/2023   Urticaria    Past Surgical History:  Procedure Laterality Date   SHOULDER SURGERY     left   WISDOM TOOTH EXTRACTION     Family History  Problem Relation Age of Onset   Anemia Father        low iron    Alzheimer's disease Paternal Grandfather    Migraines Brother    Hypertension Maternal Aunt    Migraines Cousin        Mother's 1st cousin; has them really bad    Migraines Cousin    Social History   Tobacco Use   Smoking status: Never   Smokeless tobacco: Never  Vaping Use   Vaping status: Never Used  Substance Use Topics   Alcohol use: Not Currently   Drug use: No   Allergies  Allergen Reactions   Other Anaphylaxis    Any products that contain shellfish   Shellfish Allergy Anaphylaxis   Naproxen Nausea And Vomiting   Amoxicillin Other (See Comments)   Doxycycline Other (See Comments)   Current Outpatient Medications on File Prior to Visit  Medication Sig Dispense Refill   enoxaparin (LOVENOX) 40 MG/0.4ML injection Inject 0.4 mLs (40 mg total) into the skin daily. 12 mL 11   EPINEPHrine (AUVI-Q) 0.3  mg/0.3 mL IJ SOAJ injection Use as directed for severe allergic reactions (Patient not taking: Reported on 06/15/2023) 4 each 3   Prenatal Vit-Fe Fumarate-FA (PRENATAL MULTIVITAMIN) TABS tablet Take 1 tablet by mouth daily at 12 noon.     SUMAtriptan (IMITREX) 20 MG/ACT nasal spray Place 1 spray (20 mg total) into the nose every 2 (two) hours as needed for migraine or headache. May repeat in 2 hours if headache persists or recurs. 1 each 5   No current facility-administered medications on file prior to visit.    Review of Systems Pertinent items noted in HPI and remainder of comprehensive ROS otherwise negative.  Physical Exam:  There were no vitals filed for this visit.   ***Bedside Ultrasound for FHR check: Viable intrauterine pregnancy with positive cardiac activity noted, fetal heart rate ***bpm  Patient informed that the ultrasound is considered a limited obstetric ultrasound and is not intended to be a complete ultrasound exam.  Patient also informed that the ultrasound is not being completed with the intent of assessing for fetal or placental anomalies or any pelvic abnormalities.  Explained that the purpose of today's  ultrasound is to assess for fetal heart rate.  Patient acknowledges the purpose of the exam and the limitations of the study.  General: well-developed, well-nourished female in no acute distress  Breasts:  normal appearance, no masses or tenderness bilaterally  Skin: normal coloration and turgor, no rashes  Neurologic: oriented, normal, negative, normal mood  Extremities: normal strength, tone, and muscle mass, ROM of all joints is normal  HEENT PERRLA, extraocular movement intact and sclera clear, anicteric  Neck supple and no masses  Cardiovascular: regular rate and rhythm  Respiratory:  no respiratory distress, normal breath sounds  Abdomen: soft, non-tender; bowel sounds normal; no masses,  no organomegaly  Pelvic: normal external genitalia, no lesions, normal  vaginal mucosa, normal vaginal discharge, normal cervix, ***pap smear done. 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 Supervision of high risk pregnancy, antepartum Initial labs {Blank multiple:19196::"ordered","drawn","already done"}. Offered flu shot - pt *** Discussed ACURE4MOMs. Pt {Blank multiple:19196::"accepts","declines"} Continue prenatal vitamins. Problem list reviewed and updated. Genetic Screening discussed: Panorama ordered. *** Ultrasound discussed; fetal anatomic survey: Scheduled for 12/27. Anticipatory guidance about prenatal visits given including labs, ultrasounds, and testing. Discussed usage of Babyscripts and virtual visits  2. History of deep vein thrombosis of lower extremity Continue Lovenox until 6w PP Heme referral done - appt on 11/16.   3. Dichorionic diamniotic twin pregnancy in first trimester Reviewed risks associate with twin pregnancy.   4. Pregnancy with 11 completed weeks gestation   5. Abnormal cervical Papanicolaou smear, unspecified abnormal pap finding Pap with HPV done today   The nature of [facility] with multiple MDs and Advanced Practice Providers was explained to patient; also  emphasized that residents, students are part of our team. Routine obstetric precautions reviewed. Encouraged to seek out care at office or emergency room [facility] preferred) for urgent and/or emergent concerns. No follow-ups on file.    Milas Hock, MD, FACOG Obstetrician & Gynecologist, [facility] for [facility], [facility]

## 2023-07-29 ENCOUNTER — Encounter: Payer: Self-pay | Admitting: Obstetrics and Gynecology

## 2023-07-29 ENCOUNTER — Ambulatory Visit (INDEPENDENT_AMBULATORY_CARE_PROVIDER_SITE_OTHER): Payer: BC Managed Care – PPO | Admitting: Obstetrics and Gynecology

## 2023-07-29 ENCOUNTER — Other Ambulatory Visit (HOSPITAL_COMMUNITY)
Admission: RE | Admit: 2023-07-29 | Discharge: 2023-07-29 | Disposition: A | Discharge status: Home / Self Care | Payer: BC Managed Care – PPO | Source: Ambulatory Visit | Attending: Obstetrics and Gynecology | Admitting: Obstetrics and Gynecology

## 2023-07-29 VITALS — BP 116/74 | HR 87 | Wt 209.4 lb

## 2023-07-29 DIAGNOSIS — R87619 Unspecified abnormal cytological findings in specimens from cervix uteri: Secondary | ICD-10-CM

## 2023-07-29 DIAGNOSIS — O0991 Supervision of high risk pregnancy, unspecified, first trimester: Secondary | ICD-10-CM | POA: Insufficient documentation

## 2023-07-29 DIAGNOSIS — O30041 Twin pregnancy, dichorionic/diamniotic, first trimester: Secondary | ICD-10-CM

## 2023-07-29 DIAGNOSIS — Z124 Encounter for screening for malignant neoplasm of cervix: Secondary | ICD-10-CM | POA: Insufficient documentation

## 2023-07-29 DIAGNOSIS — Z86718 Personal history of other venous thrombosis and embolism: Secondary | ICD-10-CM

## 2023-07-29 DIAGNOSIS — Z3A11 11 weeks gestation of pregnancy: Secondary | ICD-10-CM | POA: Insufficient documentation

## 2023-07-29 DIAGNOSIS — O0990 Supervision of high risk pregnancy, unspecified, unspecified trimester: Secondary | ICD-10-CM

## 2023-07-29 DIAGNOSIS — O283 Abnormal ultrasonic finding on antenatal screening of mother: Secondary | ICD-10-CM | POA: Diagnosis not present

## 2023-07-30 ENCOUNTER — Encounter: Payer: Self-pay | Admitting: Physician Assistant

## 2023-07-30 ENCOUNTER — Ambulatory Visit: Payer: BC Managed Care – PPO | Admitting: Physician Assistant

## 2023-07-30 VITALS — BP 117/78 | HR 88 | Wt 208.6 lb

## 2023-07-30 DIAGNOSIS — R519 Headache, unspecified: Secondary | ICD-10-CM | POA: Diagnosis not present

## 2023-07-30 DIAGNOSIS — G43109 Migraine with aura, not intractable, without status migrainosus: Secondary | ICD-10-CM | POA: Diagnosis not present

## 2023-07-30 DIAGNOSIS — O26891 Other specified pregnancy related conditions, first trimester: Secondary | ICD-10-CM | POA: Diagnosis not present

## 2023-07-30 DIAGNOSIS — M62838 Other muscle spasm: Secondary | ICD-10-CM | POA: Diagnosis not present

## 2023-07-30 LAB — HCV INTERPRETATION

## 2023-07-30 LAB — CBC/D/PLT+RPR+RH+ABO+RUBIGG...
Antibody Screen: NEGATIVE
Basophils Absolute: 0 10*3/uL (ref 0.0–0.2)
Basos: 0 %
EOS (ABSOLUTE): 0 10*3/uL (ref 0.0–0.4)
Eos: 0 %
HCV Ab: NONREACTIVE
HIV Screen 4th Generation wRfx: NONREACTIVE
Hematocrit: 33.1 % — ABNORMAL LOW (ref 34.0–46.6)
Hemoglobin: 11.4 g/dL (ref 11.1–15.9)
Hepatitis B Surface Ag: NEGATIVE
Immature Grans (Abs): 0 10*3/uL (ref 0.0–0.1)
Immature Granulocytes: 0 %
Lymphocytes Absolute: 2.6 10*3/uL (ref 0.7–3.1)
Lymphs: 33 %
MCH: 32.9 pg (ref 26.6–33.0)
MCHC: 34.4 g/dL (ref 31.5–35.7)
MCV: 96 fL (ref 79–97)
Monocytes Absolute: 0.6 10*3/uL (ref 0.1–0.9)
Monocytes: 7 %
Neutrophils Absolute: 4.8 10*3/uL (ref 1.4–7.0)
Neutrophils: 60 %
Platelets: 247 10*3/uL (ref 150–450)
RBC: 3.46 x10E6/uL — ABNORMAL LOW (ref 3.77–5.28)
RDW: 11.4 % — ABNORMAL LOW (ref 11.7–15.4)
RPR Ser Ql: NONREACTIVE
Rh Factor: POSITIVE
Rubella Antibodies, IGG: 2.96 {index} (ref >0.99)
WBC: 8 10*3/uL (ref 3.4–10.8)

## 2023-07-30 LAB — HEMOGLOBIN A1C
Est. average glucose Bld gHb Est-mCnc: 103 mg/dL
Hgb A1c MFr Bld: 5.2 % (ref 4.8–5.6)

## 2023-07-30 MED ORDER — CYCLOBENZAPRINE HCL 10 MG PO TABS: 10.0000 mg | ORAL_TABLET | Freq: Three times a day (TID) | ORAL | 2 refills | Status: DC | PRN

## 2023-07-30 MED ORDER — METOCLOPRAMIDE HCL 10 MG PO TABS: 10.0000 mg | ORAL_TABLET | Freq: Three times a day (TID) | ORAL | 3 refills | Status: DC | PRN

## 2023-07-30 NOTE — Progress Notes (Unsigned)
CC: New Headache   Since she was 27 yo   Located right sided frontal area  And back of neck   Has taken nortriptyline, toparamate, Imitrex Is 11 w 2 d pregnant   Photophobia, sound sensitivity   Nausea, vomiting    Family hx of migraines   Different from regular migraines- blurred vision   History:  Melissa Boyd is a 27 y.o. G1P0 at [redacted] weeks gestation with twins who presents to clinic today for new headache eval.  She has seen Dr. Johnette Abraham and other neurologists throughout her time with headaches since she was 25.  She has used nortriptyline and topiramate for prevention in the past but no preventive usage since learning of the pregnancy.  She is having blurry vision prior to HA or even without the HA.  She throws up any time she has the aura including when no headache comes.  The pain is in the right frontal region and in the back on the right and central down the neck.  She notes neck and back pain related to muscle spasm which may contribute to the headache.  She has learned to use sumatriptan nasal spray early during the attack to treat.  The vomiting that she experiences after headache is almost a signal to feel better.  She noticed green grapes to trigger attack recently.  Other triggers have included alcohol,  [place], ham, hot dog, banana, watermelon.  She prefers non-pharmacologic therapy.  She previously went to [facility] and had good results.  She uses a peppermint based product that provides a small amt of relief.  Past Medical History:  Diagnosis Date   Allergy    Phreesia 04/29/2020   DVT (deep venous thrombosis) (HCC)    Headache, migraine    Ovarian cyst 05/23/2023   Urticaria     Social History   Socioeconomic History   Marital status: Single    Spouse name: Not on file   Number of children: Not on file   Years of education: Not on file   Highest education level: Not on file  Occupational History   Not on file  Tobacco Use   Smoking status: Never    Smokeless tobacco: Never  Vaping Use   Vaping status: Never Used  Substance and Sexual Activity   Alcohol use: Not Currently   Drug use: No   Sexual activity: Yes    Birth control/protection: None  Other Topics Concern   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 She enjoys track and field.   Lives in [place].    Right handed   Caffeine: maybe once a week. Neita Carp)      Social Determinants of Health   Financial Resource Strain: Not on file  Food Insecurity: Not on file  Transportation Needs: Not on file  Physical Activity: Not on file  Stress: Not on file  Social Connections: Not on file  Intimate Partner Violence: Not on file    Family History  Problem Relation Age of Onset   Anemia Father        low iron    Alzheimer's disease Paternal Grandfather    Migraines Brother    Hypertension Maternal Aunt    Migraines Cousin        Mother's 1st cousin; has them really bad    Migraines Cousin     Allergies  Allergen Reactions   Other  Anaphylaxis    Any products that contain shellfish   Shellfish Allergy Anaphylaxis   Naproxen Nausea And Vomiting   Amoxicillin Other (See Comments)   Doxycycline Other (See Comments)    Current Outpatient Medications on File Prior to Visit  Medication Sig Dispense Refill   enoxaparin (LOVENOX) 40 MG/0.4ML injection Inject 0.4 mLs (40 mg total) into the skin daily. 12 mL 11   Prenatal Vit-Fe Fumarate-FA (PRENATAL MULTIVITAMIN) TABS tablet Take 1 tablet by mouth daily at 12 noon.     SUMAtriptan (IMITREX) 20 MG/ACT nasal spray Place 1 spray (20 mg total) into the nose every 2 (two) hours as needed for migraine or headache. May repeat in 2 hours if headache persists or recurs. 1 each 5   EPINEPHrine (AUVI-Q) 0.3 mg/0.3 mL IJ SOAJ injection Use as directed for severe allergic reactions (Patient not taking: Reported on 06/15/2023) 4  each 3   No current facility-administered medications on file prior to visit.     Review of Systems:  All pertinent positive/negative included in HPI, all other review of systems are negative   Objective:  Physical Exam BP 117/78   Pulse 88   Wt 208 lb 9.6 oz (94.6 kg)   LMP 05/08/2023 (Exact Date) Comment: + pregnancy test 9/29, irreg cycles  BMI 32.19 kg/m  CONSTITUTIONAL: Well-developed, well-nourished female in no acute distress.  EYES: EOM intact ENT: Normocephalic CARDIOVASCULAR: Regular rate  RESPIRATORY: Normal rate.  MUSCULOSKELETAL: Normal ROM, strength equal bilaterally, muscle spasm noted SKIN: Warm, dry without erythema  NEUROLOGICAL: Alert, oriented, CN II-XII grossly intact, Appropriate balance  PSYCH: Normal behavior, mood   Assessment & Plan:  Assessment: 1. Headache in pregnancy, antepartum, first trimester   2. Migraine with aura and without status migrainosus, not intractable   3. Muscle spasm    High risk twin pregnancy with h/o DVT  Plan: Cyclobenzaprine - use 1/2 tab in evening nightly for one week to reduce baseline muscle spasm and then as needed for HA/muscle spasm.   Metoclopramide use 1 tab as needed for headache/nausea Use biofreeze/ice/heat/peppermint liberally to affected area Referral to [facility] Massage/yoga/meditation/restart aerobic activity as tolerated Hydrate.  Frequent healthy snacks. Follow-up PRN  51 minutes of face to face care provided this encounter.  Bertram Denver, PA-C 07/30/2023 9:36 AM

## 2023-07-31 ENCOUNTER — Inpatient Hospital Stay: Payer: BC Managed Care – PPO | Attending: Hematology | Admitting: Hematology

## 2023-07-31 ENCOUNTER — Encounter: Payer: Self-pay | Admitting: Physician Assistant

## 2023-07-31 ENCOUNTER — Inpatient Hospital Stay: Payer: BC Managed Care – PPO

## 2023-07-31 ENCOUNTER — Encounter: Payer: Self-pay | Admitting: Hematology

## 2023-07-31 VITALS — BP 114/67 | HR 87 | Temp 98.3°F | Resp 20 | Wt 206.7 lb

## 2023-07-31 DIAGNOSIS — Z79899 Other long term (current) drug therapy: Secondary | ICD-10-CM | POA: Insufficient documentation

## 2023-07-31 DIAGNOSIS — Z86718 Personal history of other venous thrombosis and embolism: Secondary | ICD-10-CM

## 2023-07-31 DIAGNOSIS — Z880 Allergy status to penicillin: Secondary | ICD-10-CM | POA: Diagnosis not present

## 2023-07-31 DIAGNOSIS — Z881 Allergy status to other antibiotic agents status: Secondary | ICD-10-CM | POA: Insufficient documentation

## 2023-07-31 DIAGNOSIS — G43009 Migraine without aura, not intractable, without status migrainosus: Secondary | ICD-10-CM | POA: Insufficient documentation

## 2023-07-31 DIAGNOSIS — O99351 Diseases of the nervous system complicating pregnancy, first trimester: Secondary | ICD-10-CM | POA: Diagnosis not present

## 2023-07-31 DIAGNOSIS — Z8249 Family history of ischemic heart disease and other diseases of the circulatory system: Secondary | ICD-10-CM | POA: Diagnosis not present

## 2023-07-31 DIAGNOSIS — Z886 Allergy status to analgesic agent status: Secondary | ICD-10-CM | POA: Diagnosis not present

## 2023-07-31 DIAGNOSIS — Z3A11 11 weeks gestation of pregnancy: Secondary | ICD-10-CM | POA: Insufficient documentation

## 2023-07-31 DIAGNOSIS — Z832 Family history of diseases of the blood and blood-forming organs and certain disorders involving the immune mechanism: Secondary | ICD-10-CM | POA: Diagnosis not present

## 2023-07-31 DIAGNOSIS — Z818 Family history of other mental and behavioral disorders: Secondary | ICD-10-CM | POA: Insufficient documentation

## 2023-07-31 LAB — IRON AND IRON BINDING CAPACITY (CC-WL,HP ONLY)
Iron: 108 ug/dL (ref 28–170)
Saturation Ratios: 33 % — ABNORMAL HIGH (ref 10.4–31.8)
TIBC: 328 ug/dL (ref 250–450)
UIBC: 220 ug/dL (ref 148–442)

## 2023-07-31 LAB — CMP (CANCER CENTER ONLY)
ALT: 26 U/L (ref 0–44)
AST: 14 U/L — ABNORMAL LOW (ref 15–41)
Albumin: 4 g/dL (ref 3.5–5.0)
Alkaline Phosphatase: 42 U/L (ref 38–126)
Anion gap: 5 (ref 5–15)
BUN: 9 mg/dL (ref 6–20)
CO2: 27 mmol/L (ref 22–32)
Calcium: 9.1 mg/dL (ref 8.9–10.3)
Chloride: 102 mmol/L (ref 98–111)
Creatinine: 0.72 mg/dL (ref 0.44–1.00)
GFR, Estimated: >60 mL/min (ref >60)
Glucose, Bld: 74 mg/dL (ref 70–99)
Potassium: 4.2 mmol/L (ref 3.5–5.1)
Sodium: 134 mmol/L — ABNORMAL LOW (ref 135–145)
Total Bilirubin: 0.3 mg/dL (ref <1.2)
Total Protein: 7.4 g/dL (ref 6.5–8.1)

## 2023-07-31 LAB — CBC WITH DIFFERENTIAL (CANCER CENTER ONLY)
Abs Immature Granulocytes: 0.04 10*3/uL (ref 0.00–0.07)
Basophils Absolute: 0 10*3/uL (ref 0.0–0.1)
Basophils Relative: 0 %
Eosinophils Absolute: 0 10*3/uL (ref 0.0–0.5)
Eosinophils Relative: 0 %
HCT: 35.7 % — ABNORMAL LOW (ref 36.0–46.0)
Hemoglobin: 12.5 g/dL (ref 12.0–15.0)
Immature Granulocytes: 1 %
Lymphocytes Relative: 31 %
Lymphs Abs: 2.1 10*3/uL (ref 0.7–4.0)
MCH: 33.9 pg (ref 26.0–34.0)
MCHC: 35 g/dL (ref 30.0–36.0)
MCV: 96.7 fL (ref 80.0–100.0)
Monocytes Absolute: 0.6 10*3/uL (ref 0.1–1.0)
Monocytes Relative: 9 %
Neutro Abs: 4 10*3/uL (ref 1.7–7.7)
Neutrophils Relative %: 59 %
Platelet Count: 236 10*3/uL (ref 150–400)
RBC: 3.69 MIL/uL — ABNORMAL LOW (ref 3.87–5.11)
RDW: 11.9 % (ref 11.5–15.5)
WBC Count: 6.8 10*3/uL (ref 4.0–10.5)
nRBC: 0 % (ref 0.0–0.2)

## 2023-07-31 LAB — VITAMIN B12: Vitamin B-12: 407 pg/mL (ref 180–914)

## 2023-07-31 LAB — FERRITIN: Ferritin: 37 ng/mL (ref 11–307)
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
## 2023-08-02 ENCOUNTER — Other Ambulatory Visit: Payer: Self-pay | Admitting: Hematology

## 2023-08-02 DIAGNOSIS — Z86718 Personal history of other venous thrombosis and embolism: Secondary | ICD-10-CM

## 2023-08-02 MED ORDER — ENOXAPARIN SODIUM 60 MG/0.6ML IJ SOSY: 60.0000 mg | PREFILLED_SYRINGE | INTRAMUSCULAR | 6 refills | Status: AC

## 2023-08-04 LAB — CYTOLOGY - PAP
Chlamydia: NEGATIVE
Comment: NEGATIVE
Comment: NEGATIVE
Comment: NORMAL
Diagnosis: NEGATIVE
High risk HPV: NEGATIVE
Neisseria Gonorrhea: NEGATIVE

## 2023-08-05 LAB — PROTHROMBIN GENE MUTATION

## 2023-08-07 LAB — PANORAMA PRENATAL TEST FULL PANEL:PANORAMA TEST PLUS 5 ADDITIONAL MICRODELETIONS
FETAL FRACTION SECOND FETUS: 4
FETAL FRACTION: 5

## 2023-08-09 LAB — FACTOR 5 LEIDEN

## 2023-08-10 LAB — HORIZON CUSTOM: REPORT SUMMARY: NEGATIVE

## 2023-08-11 DIAGNOSIS — G4486 Cervicogenic headache: Secondary | ICD-10-CM | POA: Diagnosis not present

## 2023-08-11 DIAGNOSIS — R6884 Jaw pain: Secondary | ICD-10-CM | POA: Diagnosis not present

## 2023-08-11 DIAGNOSIS — M5093 Cervical disc disorder, unspecified, cervicothoracic region: Secondary | ICD-10-CM | POA: Diagnosis not present

## 2023-08-11 DIAGNOSIS — M5459 Other low back pain: Secondary | ICD-10-CM | POA: Diagnosis not present

## 2023-08-17 ENCOUNTER — Inpatient Hospital Stay: Payer: BC Managed Care – PPO | Attending: Hematology | Admitting: Hematology

## 2023-08-17 DIAGNOSIS — Z818 Family history of other mental and behavioral disorders: Secondary | ICD-10-CM | POA: Insufficient documentation

## 2023-08-17 DIAGNOSIS — Z8249 Family history of ischemic heart disease and other diseases of the circulatory system: Secondary | ICD-10-CM | POA: Insufficient documentation

## 2023-08-17 DIAGNOSIS — O99111 Other diseases of the blood and blood-forming organs and certain disorders involving the immune mechanism complicating pregnancy, first trimester: Secondary | ICD-10-CM | POA: Insufficient documentation

## 2023-08-17 DIAGNOSIS — O99351 Diseases of the nervous system complicating pregnancy, first trimester: Secondary | ICD-10-CM | POA: Diagnosis not present

## 2023-08-17 DIAGNOSIS — Z880 Allergy status to penicillin: Secondary | ICD-10-CM | POA: Insufficient documentation

## 2023-08-17 DIAGNOSIS — Z3A11 11 weeks gestation of pregnancy: Secondary | ICD-10-CM | POA: Insufficient documentation

## 2023-08-17 DIAGNOSIS — Z86718 Personal history of other venous thrombosis and embolism: Secondary | ICD-10-CM | POA: Diagnosis not present

## 2023-08-17 DIAGNOSIS — Z79899 Other long term (current) drug therapy: Secondary | ICD-10-CM | POA: Diagnosis not present

## 2023-08-17 DIAGNOSIS — Z832 Family history of diseases of the blood and blood-forming organs and certain disorders involving the immune mechanism: Secondary | ICD-10-CM | POA: Diagnosis not present

## 2023-08-17 DIAGNOSIS — Z881 Allergy status to other antibiotic agents status: Secondary | ICD-10-CM | POA: Diagnosis not present

## 2023-08-17 DIAGNOSIS — Z886 Allergy status to analgesic agent status: Secondary | ICD-10-CM | POA: Insufficient documentation
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
## 2023-08-18 DIAGNOSIS — R6884 Jaw pain: Secondary | ICD-10-CM | POA: Diagnosis not present

## 2023-08-18 DIAGNOSIS — M5459 Other low back pain: Secondary | ICD-10-CM | POA: Diagnosis not present

## 2023-08-18 DIAGNOSIS — M5093 Cervical disc disorder, unspecified, cervicothoracic region: Secondary | ICD-10-CM | POA: Diagnosis not present

## 2023-08-18 DIAGNOSIS — G4486 Cervicogenic headache: Secondary | ICD-10-CM | POA: Diagnosis not present

## 2023-08-21 ENCOUNTER — Inpatient Hospital Stay (HOSPITAL_COMMUNITY)
Admission: AD | Admit: 2023-08-21 | Discharge: 2023-08-21 | Disposition: A | Discharge status: Home / Self Care | Payer: BC Managed Care – PPO | Source: Ambulatory Visit | Attending: Obstetrics & Gynecology | Admitting: Obstetrics & Gynecology

## 2023-08-21 ENCOUNTER — Encounter (HOSPITAL_COMMUNITY): Payer: Self-pay | Admitting: Obstetrics and Gynecology

## 2023-08-21 DIAGNOSIS — O209 Hemorrhage in early pregnancy, unspecified: Secondary | ICD-10-CM | POA: Diagnosis not present

## 2023-08-21 DIAGNOSIS — N760 Acute vaginitis: Secondary | ICD-10-CM | POA: Insufficient documentation

## 2023-08-21 DIAGNOSIS — Z3A14 14 weeks gestation of pregnancy: Secondary | ICD-10-CM | POA: Diagnosis not present

## 2023-08-21 DIAGNOSIS — O30042 Twin pregnancy, dichorionic/diamniotic, second trimester: Secondary | ICD-10-CM | POA: Diagnosis not present

## 2023-08-21 DIAGNOSIS — O23592 Infection of other part of genital tract in pregnancy, second trimester: Secondary | ICD-10-CM | POA: Insufficient documentation

## 2023-08-21 DIAGNOSIS — Z86718 Personal history of other venous thrombosis and embolism: Secondary | ICD-10-CM | POA: Insufficient documentation

## 2023-08-21 DIAGNOSIS — Z7901 Long term (current) use of anticoagulants: Secondary | ICD-10-CM | POA: Diagnosis not present

## 2023-08-21 DIAGNOSIS — B9689 Other specified bacterial agents as the cause of diseases classified elsewhere: Secondary | ICD-10-CM | POA: Insufficient documentation

## 2023-08-21 DIAGNOSIS — O30041 Twin pregnancy, dichorionic/diamniotic, first trimester: Secondary | ICD-10-CM

## 2023-08-21 LAB — URINALYSIS, ROUTINE W REFLEX MICROSCOPIC
Bilirubin Urine: NEGATIVE
Glucose, UA: NEGATIVE mg/dL
Ketones, ur: NEGATIVE mg/dL
Leukocytes,Ua: NEGATIVE
Nitrite: NEGATIVE
Protein, ur: NEGATIVE mg/dL
Specific Gravity, Urine: 1.009 (ref 1.005–1.030)
pH: 7 (ref 5.0–8.0)

## 2023-08-21 LAB — WET PREP, GENITAL
Sperm: NONE SEEN
Trich, Wet Prep: NONE SEEN
WBC, Wet Prep HPF POC: >=10 — AB (ref <10)
Yeast Wet Prep HPF POC: NONE SEEN

## 2023-08-21 MED ORDER — METRONIDAZOLE 0.75 % VA GEL
1.0000 | Freq: Every day | VAGINAL | 0 refills | Status: AC
Start: 2023-08-21 — End: 2023-08-28

## 2023-08-21 NOTE — Progress Notes (Signed)
Written and verbal d/c instructions given and understanding voiced. 

## 2023-08-21 NOTE — MAU Provider Note (Signed)
Chief Complaint: Abdominal Pain and Vaginal Bleeding   Event Date/Time   First Provider Initiated Contact with Patient 08/21/23 1947      SUBJECTIVE HPI: Melissa Boyd is a 27 y.o. G1P0 at [redacted]w[redacted]d by early ultrasound who presents to maternity admissions reporting vaginal bleeding.  Di-di twins. She has a history of DVT and is on daily lovenox. Increased dose this past week. Notes having bright red blood in her underwear this afternoon. Put on a pad and had some spotting. No clots or passage of tissue. Denies urinary symptoms, change in discharge. Mild cramping.  HPI  Past Medical History:  Diagnosis Date   Allergy    Phreesia 04/29/2020   DVT (deep venous thrombosis) (HCC)    Headache, migraine    Ovarian cyst 05/23/2023   Urticaria    Past Surgical History:  Procedure Laterality Date   SHOULDER SURGERY     left   WISDOM TOOTH EXTRACTION     Social History   Socioeconomic History   Marital status: Single    Spouse name: Not on file   Number of children: Not on file   Years of education: Not on file   Highest education level: Not on file  Occupational History   Not on file  Tobacco Use   Smoking status: Never   Smokeless tobacco: Never  Vaping Use   Vaping status: Never Used  Substance and Sexual Activity   Alcohol use: Not Currently   Drug use: No   Sexual activity: Yes    Birth control/protection: None  Other Topics Concern   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 She enjoys track and field.   Lives in [place].    Right handed   Caffeine: maybe once a week. Neita Carp)      Social Determinants of Health   Financial Resource Strain: Not on file  Food Insecurity: No Food Insecurity (07/31/2023)   Hunger Vital Sign    Worried About Running Out of Food in the Last Year: Never true    Ran Out of Food in the Last Year: Never true  Transportation  Needs: No Transportation Needs (07/31/2023)   PRAPARE - Administrator, Civil Service (Medical): No    Lack of Transportation (Non-Medical): No  Physical Activity: Not on file  Stress: Not on file  Social Connections: Not on file  Intimate Partner Violence: Not At Risk (07/31/2023)   Humiliation, Afraid, Rape, and Kick questionnaire    Fear of Current or Ex-Partner: No    Emotionally Abused: No    Physically Abused: No    Sexually Abused: No   No current facility-administered medications on file prior to encounter.   Current Outpatient Medications on File Prior to Encounter  Medication Sig Dispense Refill   enoxaparin (LOVENOX) 60 MG/0.6ML injection Inject 0.6 mLs (60 mg total) into the skin daily. 30 mL 6   Prenatal Vit-Fe Fumarate-FA (PRENATAL MULTIVITAMIN) TABS tablet Take 1 tablet by mouth daily at 12 noon.     SUMAtriptan (IMITREX) 20 MG/ACT nasal spray Place 1 spray (20 mg total) into the nose every 2 (two) hours as needed for migraine or headache. May repeat in 2 hours if headache persists or recurs. 1 each 5   cyclobenzaprine (FLEXERIL) 10 MG tablet Take 1 tablet (10 mg total) by mouth 3 (three) times daily as needed for muscle spasms. (Patient taking differently:  Take 10 mg by mouth 3 (three) times daily as needed for muscle spasms. Has med but has not needed) 30 tablet 2   EPINEPHrine (AUVI-Q) 0.3 mg/0.3 mL IJ SOAJ injection Use as directed for severe allergic reactions (Patient not taking: Reported on 06/15/2023) 4 each 3   metoCLOPramide (REGLAN) 10 MG tablet Take 1 tablet (10 mg total) by mouth 3 (three) times daily as needed for nausea. 30 tablet 3   Allergies  Allergen Reactions   Other Anaphylaxis    Any products that contain shellfish   Shellfish Allergy Anaphylaxis   Naproxen Nausea And Vomiting   Amoxicillin Other (See Comments)   Doxycycline Other (See Comments)    ROS:  Pertinent positives/negatives listed above.  I have reviewed patient's Past  Medical Hx, Surgical Hx, Family Hx, Social Hx, medications and allergies.   Physical Exam  Patient Vitals for the past 24 hrs:  BP Temp Pulse Resp SpO2 Height Weight  08/21/23 2042 123/68 -- 87 -- -- -- --  08/21/23 1919 123/63 -- -- -- -- -- --  08/21/23 1915 -- 98.7 F (37.1 C) 77 18 100 % 5' 7.5" (1.715 m) 97.5 kg   Constitutional: Well-developed, well-nourished female in no acute distress Cardiovascular: normal rate, regular rhythm, no m/r/g Respiratory: normal effort, CTAB GI: Abd soft, non-tender. Pos BS x 4 MS: Extremities nontender, no edema, normal ROM Neurologic: Alert and oriented x 4.  GU: Neg CVAT.  PELVIC EXAM: Cervix pink, visually closed and thick. Dried blood at os and in vault. No active bleeding, lesions  Bedside ultrasound used to confirm to [facility]. Both fetuses visualized with diving membrane and 2 placentas. 150-160s FHT visualized on both, and movement seen on both.  LAB RESULTS Results for orders placed or performed during the hospital encounter of 08/21/23 (from the past 24 hour(s))  Urinalysis, Routine w reflex microscopic -Urine, Clean Catch     Status: Abnormal   Collection Time: 08/21/23  7:20 PM  Result Value Ref Range   Color, Urine STRAW (A) YELLOW   APPearance CLEAR CLEAR   Specific Gravity, Urine 1.009 1.005 - 1.030   pH 7.0 5.0 - 8.0   Glucose, UA NEGATIVE NEGATIVE mg/dL   Hgb urine dipstick LARGE (A) NEGATIVE   Bilirubin Urine NEGATIVE NEGATIVE   Ketones, ur NEGATIVE NEGATIVE mg/dL   Protein, ur NEGATIVE NEGATIVE mg/dL   Nitrite NEGATIVE NEGATIVE   Leukocytes,Ua NEGATIVE NEGATIVE   RBC / HPF 0-5 0 - 5 RBC/hpf   WBC, UA 0-5 0 - 5 WBC/hpf   Bacteria, UA RARE (A) NONE SEEN   Squamous Epithelial / HPF 0-5 0 - 5 /HPF  Wet prep, genital     Status: Abnormal   Collection Time: 08/21/23  8:30 PM   Specimen: Vaginal  Result Value Ref Range   Yeast Wet Prep HPF POC NONE SEEN NONE SEEN   Trich, Wet Prep NONE SEEN NONE SEEN   Clue Cells Wet Prep  HPF POC PRESENT (A) NONE SEEN   WBC, Wet Prep HPF POC >=10 (A) <10   Sperm NONE SEEN     B/Positive/-- (11/14 1516)  IMAGING No results found.  MAU Management/MDM: Orders Placed This Encounter  Procedures   Wet prep, genital   Urinalysis, Routine w reflex microscopic -Urine, Clean Catch   Discharge patient    Meds ordered this encounter  Medications   metroNIDAZOLE (METROGEL) 0.75 % vaginal gel    Sig: Place 1 Applicatorful vaginally at bedtime for 7 days.  Dispense:  70 g    Refill:  0    Patient presents for moderate bleeding at 14 week di-di pregnancy. C/b lovenox for history of unprovoked DVT. Reassuringly has not passed any tissue, clots, and cramping has much improved. Still having light bright red bleeding. BSUS visualized two viable fetuses with appropriate FHT and movement. SSE did reveal old blood but no active bleeding. Additionally tested positive for BV.  Discussed with patient that the two fetuses have heart beats and movement at this time which is reassuring. I do suspect BV infection with possible subchorionic hemorrhage on top of increased dose of lovenox. However I cannot rule-out a miscarriage in the future. Discussed that she is at a very increased risk of bleeding with her lovenox but definitely should not stop taking it given her hx of unprovoked DVT. Overall gave activity modifications of lifting <10 lbs, full pelvic rest, and treatment for BV with metrogel. Close return precautions given for fever, increased cramping, passage of clots/tissue. Has follow-up with primary OB scheduled Thursday, and I encouraged her to keep this.  ASSESSMENT 1. Vaginal bleeding affecting early pregnancy   2. Dichorionic diamniotic twin pregnancy in first trimester   3. [redacted] weeks gestation of pregnancy   4. Bacterial vaginosis     PLAN Discharge home with strict return precautions. Allergies as of 08/21/2023       Reactions   Other Anaphylaxis   Any products that contain  shellfish   Shellfish Allergy Anaphylaxis   Naproxen Nausea And Vomiting   Amoxicillin Other (See Comments)   Doxycycline Other (See Comments)        Medication List     TAKE these medications    cyclobenzaprine 10 MG tablet Commonly known as: FLEXERIL Take 1 tablet (10 mg total) by mouth 3 (three) times daily as needed for muscle spasms. What changed: additional instructions   enoxaparin 60 MG/0.6ML injection Commonly known as: LOVENOX Inject 0.6 mLs (60 mg total) into the skin daily.   EPINEPHrine 0.3 mg/0.3 mL Soaj injection Commonly known as: Auvi-Q Use as directed for severe allergic reactions   metoCLOPramide 10 MG tablet Commonly known as: Reglan Take 1 tablet (10 mg total) by mouth 3 (three) times daily as needed for nausea.   metroNIDAZOLE 0.75 % vaginal gel Commonly known as: METROGEL Place 1 Applicatorful vaginally at bedtime for 7 days.   prenatal multivitamin Tabs tablet Take 1 tablet by mouth daily at 12 noon.   SUMAtriptan 20 MG/ACT nasal spray Commonly known as: IMITREX Place 1 spray (20 mg total) into the nose every 2 (two) hours as needed for migraine or headache. May repeat in 2 hours if headache persists or recurs.         Wylene Simmer, MD OB Fellow 08/21/2023  9:11 PM

## 2023-08-21 NOTE — MAU Note (Signed)
.  Melissa Boyd is a 27 y.o. at [redacted]w[redacted]d here in MAU reporting vaginal bleeding since 1820. Having some abdominal cramping. On blood thinner due to hx DVT. No recent intercourse. Hard to get up and down.,  Onset of complaint: 1820 Pain score: 5 Vitals:   08/21/23 1915 08/21/23 1919  BP:  123/63  Pulse: 77   Resp: 18   Temp: 98.7 F (37.1 C)   SpO2: 100%      FHT:155 and 156 but unsure if 2 diff babies Lab orders placed from triage:  u/a

## 2023-08-21 NOTE — MAU Note (Signed)
Spec exam and minimal bleeding noted. Dr Earlene Plater did bedside u/s and pt reassured.

## 2023-08-25 ENCOUNTER — Ambulatory Visit (INDEPENDENT_AMBULATORY_CARE_PROVIDER_SITE_OTHER): Payer: BC Managed Care – PPO | Admitting: Obstetrics and Gynecology

## 2023-08-25 ENCOUNTER — Encounter: Payer: Self-pay | Admitting: Obstetrics and Gynecology

## 2023-08-25 ENCOUNTER — Other Ambulatory Visit: Payer: Self-pay

## 2023-08-25 VITALS — BP 119/71 | HR 82 | Wt 212.0 lb

## 2023-08-25 DIAGNOSIS — O0990 Supervision of high risk pregnancy, unspecified, unspecified trimester: Secondary | ICD-10-CM

## 2023-08-25 DIAGNOSIS — O30041 Twin pregnancy, dichorionic/diamniotic, first trimester: Secondary | ICD-10-CM

## 2023-08-25 DIAGNOSIS — Z3A15 15 weeks gestation of pregnancy: Secondary | ICD-10-CM

## 2023-08-25 DIAGNOSIS — Z86718 Personal history of other venous thrombosis and embolism: Secondary | ICD-10-CM

## 2023-08-25 DIAGNOSIS — O2642 Herpes gestationis, second trimester: Secondary | ICD-10-CM

## 2023-08-25 DIAGNOSIS — A6000 Herpesviral infection of urogenital system, unspecified: Secondary | ICD-10-CM

## 2023-08-25 DIAGNOSIS — R87619 Unspecified abnormal cytological findings in specimens from cervix uteri: Secondary | ICD-10-CM

## 2023-08-25 DIAGNOSIS — O3443 Maternal care for other abnormalities of cervix, third trimester: Secondary | ICD-10-CM

## 2023-08-25 NOTE — Progress Notes (Signed)
   PRENATAL VISIT NOTE  Subjective:  Melissa Boyd is a 27 y.o. G1P0 at [redacted]w[redacted]d being seen today for ongoing prenatal care.  She is currently monitored for the following issues for this high-risk pregnancy and has Migraines; Migraine with aura; History of deep vein thrombosis of lower extremity; Food allergy; Paroxysmal supraventricular tachycardia (HCC); Urticaria; Dermatographism; Other allergic rhinitis; Adverse reaction to food, subsequent encounter; Vertigo; Dichorionic diamniotic twin pregnancy in first trimester; Supervision of high risk pregnancy, antepartum; Genital herpes simplex type 2; Abnormal cervical Papanicolaou smear; Headache in pregnancy, antepartum, first trimester; and Muscle spasm on their problem list.  Patient reports backache.  Contractions: Not present. Vag. Bleeding: Other (dark).  Movement: Absent. Denies leaking of fluid.   The following portions of the patient's history were reviewed and updated as appropriate: allergies, current medications, past family history, past medical history, past social history, past surgical history and problem list.   Objective:   Vitals:   08/25/23 1632  BP: 119/71  Pulse: 82  Weight: 212 lb (96.2 kg)    Fetal Status: Fetal Heart Rate (bpm): 145/154   Movement: Absent     General:  Alert, oriented and cooperative. Patient is in no acute distress.  Skin: Skin is warm and dry. No rash noted.   Cardiovascular: Normal heart rate noted  Respiratory: Normal respiratory effort, no problems with respiration noted  Abdomen: Soft, gravid, appropriate for gestational age.  Pain/Pressure: Present     Pelvic: Cervical exam deferred        Extremities: Normal range of motion.     Mental Status: Normal mood and affect. Normal behavior. Normal judgment and thought content.   Assessment and Plan:  Pregnancy: G1P0 at [redacted]w[redacted]d 1. History of deep vein thrombosis of lower extremity S/p Heme consult. Lovenox 60 mg until delivery and 6 wks pp.   2.  Supervision of high risk pregnancy, antepartum AFP today Pap/HPV wnl last visit.  Since vaginal bleeding having significant lower pelvic pain and back pain when she wears belly band. She has tried two different models. Will have her return to work on light duty. Discussed handicap placard. Will check with partners for our routines/standards. She will try another model of belly band. If this is insufficient, I would recommend PT given her early gestation and twins.   3. Dichorionic diamniotic twin pregnancy in first trimester Has anatomy US on 1/10  4. Genital herpes simplex type 2 Recommend valtrex at 35-36w  Preterm labor symptoms and general obstetric precautions including but not limited to vaginal bleeding, contractions, leaking of fluid and fetal movement were reviewed in detail with the patient. Please refer to After Visit Summary for other counseling recommendations.   Return in about 4 weeks (around 09/22/2023) for OB VISIT, MD or APP.  Future Appointments  Date Time Provider Department Center  08/31/2023  3:30 PM Butch Penny, NP [facility] None  09/24/2023  9:15 AM Sue Lush, FNP [facility]  09/24/2023 10:15 AM [facility] NURSE [facility]  09/24/2023 10:30 AM [facility] US1 [facility]  10/12/2023  1:45 PM [facility] LAB [facility] None  10/12/2023  2:00 PM Johney Maine, MD [facility] None  01/17/2024  2:30 PM Butch Penny, NP [facility] None    Milas Hock, MD

## 2023-08-26 ENCOUNTER — Encounter: Payer: BC Managed Care – PPO | Admitting: Certified Nurse Midwife

## 2023-08-26 ENCOUNTER — Encounter: Payer: Self-pay | Admitting: Obstetrics and Gynecology

## 2023-08-26 DIAGNOSIS — M549 Dorsalgia, unspecified: Secondary | ICD-10-CM

## 2023-08-27 LAB — AFP, SERUM, OPEN SPINA BIFIDA
AFP MoM: 2.28
AFP Value: 53.5 ng/mL
Gest. Age on Collection Date: 15 wk
Maternal Age At EDD: 30.3 a
OSBR Risk 1 IN: 1011
Test Results:: NEGATIVE
Weight: 212 [lb_av]

## 2023-08-31 ENCOUNTER — Telehealth: Payer: BC Managed Care – PPO | Admitting: Adult Health

## 2023-08-31 DIAGNOSIS — G43109 Migraine with aura, not intractable, without status migrainosus: Secondary | ICD-10-CM

## 2023-08-31 DIAGNOSIS — G43009 Migraine without aura, not intractable, without status migrainosus: Secondary | ICD-10-CM | POA: Diagnosis not present
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
## 2023-09-01 DIAGNOSIS — R6884 Jaw pain: Secondary | ICD-10-CM | POA: Diagnosis not present

## 2023-09-01 DIAGNOSIS — M5093 Cervical disc disorder, unspecified, cervicothoracic region: Secondary | ICD-10-CM | POA: Diagnosis not present

## 2023-09-01 DIAGNOSIS — M5459 Other low back pain: Secondary | ICD-10-CM | POA: Diagnosis not present

## 2023-09-01 DIAGNOSIS — G4486 Cervicogenic headache: Secondary | ICD-10-CM | POA: Diagnosis not present

## 2023-09-06 DIAGNOSIS — M5459 Other low back pain: Secondary | ICD-10-CM | POA: Diagnosis not present

## 2023-09-06 DIAGNOSIS — G4486 Cervicogenic headache: Secondary | ICD-10-CM | POA: Diagnosis not present

## 2023-09-06 DIAGNOSIS — M5093 Cervical disc disorder, unspecified, cervicothoracic region: Secondary | ICD-10-CM | POA: Diagnosis not present

## 2023-09-06 DIAGNOSIS — R6884 Jaw pain: Secondary | ICD-10-CM | POA: Diagnosis not present

## 2023-09-10 ENCOUNTER — Ambulatory Visit: Payer: BC Managed Care – PPO

## 2023-09-13 DIAGNOSIS — R6884 Jaw pain: Secondary | ICD-10-CM | POA: Diagnosis not present

## 2023-09-13 DIAGNOSIS — M5093 Cervical disc disorder, unspecified, cervicothoracic region: Secondary | ICD-10-CM | POA: Diagnosis not present

## 2023-09-13 DIAGNOSIS — G4486 Cervicogenic headache: Secondary | ICD-10-CM | POA: Diagnosis not present

## 2023-09-13 DIAGNOSIS — M5459 Other low back pain: Secondary | ICD-10-CM | POA: Diagnosis not present

## 2023-09-16 DIAGNOSIS — M5459 Other low back pain: Secondary | ICD-10-CM | POA: Diagnosis not present

## 2023-09-16 DIAGNOSIS — M5093 Cervical disc disorder, unspecified, cervicothoracic region: Secondary | ICD-10-CM | POA: Diagnosis not present

## 2023-09-16 DIAGNOSIS — R6884 Jaw pain: Secondary | ICD-10-CM | POA: Diagnosis not present

## 2023-09-16 DIAGNOSIS — G4486 Cervicogenic headache: Secondary | ICD-10-CM | POA: Diagnosis not present

## 2023-09-20 DIAGNOSIS — R6884 Jaw pain: Secondary | ICD-10-CM | POA: Diagnosis not present

## 2023-09-20 DIAGNOSIS — M5093 Cervical disc disorder, unspecified, cervicothoracic region: Secondary | ICD-10-CM | POA: Diagnosis not present

## 2023-09-20 DIAGNOSIS — M5459 Other low back pain: Secondary | ICD-10-CM | POA: Diagnosis not present

## 2023-09-20 DIAGNOSIS — G4486 Cervicogenic headache: Secondary | ICD-10-CM | POA: Diagnosis not present

## 2023-09-22 DIAGNOSIS — R6884 Jaw pain: Secondary | ICD-10-CM | POA: Diagnosis not present

## 2023-09-22 DIAGNOSIS — M5093 Cervical disc disorder, unspecified, cervicothoracic region: Secondary | ICD-10-CM | POA: Diagnosis not present

## 2023-09-22 DIAGNOSIS — M5459 Other low back pain: Secondary | ICD-10-CM | POA: Diagnosis not present

## 2023-09-22 DIAGNOSIS — G4486 Cervicogenic headache: Secondary | ICD-10-CM | POA: Diagnosis not present

## 2023-09-24 ENCOUNTER — Ambulatory Visit: Payer: BC Managed Care – PPO | Attending: Obstetrics and Gynecology | Admitting: *Deleted

## 2023-09-24 ENCOUNTER — Ambulatory Visit: Payer: BC Managed Care – PPO | Attending: Obstetrics and Gynecology | Admitting: Obstetrics and Gynecology

## 2023-09-24 ENCOUNTER — Ambulatory Visit: Payer: BC Managed Care – PPO

## 2023-09-24 ENCOUNTER — Encounter: Payer: Self-pay | Admitting: Obstetrics and Gynecology

## 2023-09-24 ENCOUNTER — Other Ambulatory Visit: Payer: Self-pay

## 2023-09-24 ENCOUNTER — Encounter: Payer: Self-pay | Admitting: *Deleted

## 2023-09-24 ENCOUNTER — Other Ambulatory Visit: Payer: Self-pay | Admitting: *Deleted

## 2023-09-24 ENCOUNTER — Other Ambulatory Visit (HOSPITAL_COMMUNITY)
Admission: RE | Admit: 2023-09-24 | Discharge: 2023-09-24 | Disposition: A | Discharge status: Home / Self Care | Payer: BC Managed Care – PPO | Source: Ambulatory Visit | Attending: Obstetrics and Gynecology | Admitting: Obstetrics and Gynecology

## 2023-09-24 ENCOUNTER — Ambulatory Visit (INDEPENDENT_AMBULATORY_CARE_PROVIDER_SITE_OTHER): Payer: BC Managed Care – PPO | Admitting: Obstetrics and Gynecology

## 2023-09-24 VITALS — BP 109/79 | HR 116 | Wt 214.0 lb

## 2023-09-24 VITALS — BP 98/50 | HR 87

## 2023-09-24 DIAGNOSIS — N898 Other specified noninflammatory disorders of vagina: Secondary | ICD-10-CM | POA: Diagnosis not present

## 2023-09-24 DIAGNOSIS — O30042 Twin pregnancy, dichorionic/diamniotic, second trimester: Secondary | ICD-10-CM

## 2023-09-24 DIAGNOSIS — O99212 Obesity complicating pregnancy, second trimester: Secondary | ICD-10-CM

## 2023-09-24 DIAGNOSIS — Z8679 Personal history of other diseases of the circulatory system: Secondary | ICD-10-CM

## 2023-09-24 DIAGNOSIS — Z86718 Personal history of other venous thrombosis and embolism: Secondary | ICD-10-CM | POA: Insufficient documentation

## 2023-09-24 DIAGNOSIS — Z3A19 19 weeks gestation of pregnancy: Secondary | ICD-10-CM

## 2023-09-24 DIAGNOSIS — O0992 Supervision of high risk pregnancy, unspecified, second trimester: Secondary | ICD-10-CM | POA: Diagnosis not present

## 2023-09-24 DIAGNOSIS — O0990 Supervision of high risk pregnancy, unspecified, unspecified trimester: Secondary | ICD-10-CM

## 2023-09-24 DIAGNOSIS — O283 Abnormal ultrasonic finding on antenatal screening of mother: Secondary | ICD-10-CM

## 2023-09-24 DIAGNOSIS — Z7901 Long term (current) use of anticoagulants: Secondary | ICD-10-CM | POA: Insufficient documentation

## 2023-09-24 DIAGNOSIS — E669 Obesity, unspecified: Secondary | ICD-10-CM

## 2023-09-24 DIAGNOSIS — O2692 Pregnancy related conditions, unspecified, second trimester: Secondary | ICD-10-CM | POA: Insufficient documentation

## 2023-09-24 DIAGNOSIS — O355XX2 Maternal care for (suspected) damage to fetus by drugs, fetus 2: Secondary | ICD-10-CM

## 2023-09-24 DIAGNOSIS — O09292 Supervision of pregnancy with other poor reproductive or obstetric history, second trimester: Secondary | ICD-10-CM

## 2023-09-24 DIAGNOSIS — O30041 Twin pregnancy, dichorionic/diamniotic, first trimester: Secondary | ICD-10-CM

## 2023-09-24 DIAGNOSIS — O26892 Other specified pregnancy related conditions, second trimester: Secondary | ICD-10-CM

## 2023-09-24 DIAGNOSIS — A6000 Herpesviral infection of urogenital system, unspecified: Secondary | ICD-10-CM

## 2023-09-24 DIAGNOSIS — O355XX1 Maternal care for (suspected) damage to fetus by drugs, fetus 1: Secondary | ICD-10-CM | POA: Diagnosis not present

## 2023-09-24 DIAGNOSIS — R002 Palpitations: Secondary | ICD-10-CM
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
## 2023-09-24 NOTE — Progress Notes (Signed)
   PRENATAL VISIT NOTE  Subjective:  Melissa Boyd is a 28 y.o. G1P0 at [redacted]w[redacted]d being seen today for ongoing prenatal care.  She is currently monitored for the following issues for this high-risk pregnancy and has Migraines; History of deep vein thrombosis of lower extremity; Food allergy; Paroxysmal supraventricular tachycardia (HCC); Adverse reaction to food, subsequent encounter; Dichorionic diamniotic twin pregnancy in first trimester; Supervision of high risk pregnancy, antepartum; Genital herpes simplex type 2; Abnormal cervical Papanicolaou smear; Headache in pregnancy, antepartum, first trimester; Muscle spasm; and Baby B with [facility] on their problem list.  Patient reports  reports palpitations and tachycardia with and without activity, feels like she has to catch her breath, denies chest pain . Reports vaginal d/c watery consistency  Contractions: Not present. Vag. Bleeding: None.  Movement: Present. Denies leaking of fluid.   The following portions of the patient's history were reviewed and updated as appropriate: allergies, current medications, past family history, past medical history, past social history, past surgical history and problem list.   Objective:   Vitals:   09/24/23 0929  BP: 109/79  Pulse: (!) 116  Weight: 214 lb (97.1 kg)    Fetal Status:     Movement: Present     General:  Alert, oriented and cooperative. Patient is in no acute distress.  Skin: Skin is warm and dry. No rash noted.   Cardiovascular: Normal heart rate noted  Respiratory: Normal respiratory effort, no problems with respiration noted  Abdomen: Soft, gravid, appropriate for gestational age.  Pain/Pressure: Present (at night when go to bed)     Pelvic: Cervical exam deferred        Extremities: Normal range of motion.  Edema: None  Mental Status: Normal mood and affect. Normal behavior. Normal judgment and thought content.   Assessment and Plan:  Pregnancy: G1P0 at [redacted]w[redacted]d 1. Supervision of high risk  pregnancy, antepartum (Primary) BP and FHR normal Doing well overall feeling movement    2. History of deep vein thrombosis of lower extremity DVT 2016, S/p Heme consult continue Lovenox  60mg  until delivery and 6 wks pp   To start ASA 81 mg per MFM  3. Dichorionic diamniotic twin pregnancy in first trimester Anatomy scan scheduled today   4. Genital herpes simplex type 2 Suppression 35-36 w  5. [redacted] weeks gestation of pregnancy Continue routine care Supportive measures discussed regarding abdominal/back pain, including maternity belt Fern negative, discussed d/c in pregnancy and when to follow up   6. Palpitations 7. History of paroxysmal supraventricular tachycardia Will refer to [facility] cardiology d/t symptoms and SVT history   - AMB Referral to Cardio Obstetrics   Preterm labor symptoms and general obstetric precautions including but not limited to vaginal bleeding, contractions, leaking of fluid and fetal movement were reviewed in detail with the patient. Please refer to After Visit Summary for other counseling recommendations.   Return in about 4 weeks (around 10/22/2023) for [facility] visit.  Future Appointments  Date Time Provider Department Center  10/12/2023  1:45 PM [facility] None  10/12/2023  2:00 PM [place] Emaline Brink, MD [facility] None  10/22/2023  1:30 PM [facility] US3 [facility]  10/25/2023 10:55 AM Anyanwu, Gloris LABOR, MD [facility]  10/29/2023  8:30 AM Sherryl Bouchard, NP [facility] None  01/17/2024  2:30 PM Sherryl Bouchard, NP [facility] None    Nidia Daring, FNP

## 2023-09-27 DIAGNOSIS — G4486 Cervicogenic headache: Secondary | ICD-10-CM | POA: Diagnosis not present

## 2023-09-27 DIAGNOSIS — R6884 Jaw pain: Secondary | ICD-10-CM | POA: Diagnosis not present

## 2023-09-27 DIAGNOSIS — M5093 Cervical disc disorder, unspecified, cervicothoracic region: Secondary | ICD-10-CM | POA: Diagnosis not present

## 2023-09-27 DIAGNOSIS — M5459 Other low back pain: Secondary | ICD-10-CM | POA: Diagnosis not present

## 2023-09-27 LAB — CERVICOVAGINAL ANCILLARY ONLY
Bacterial Vaginitis (gardnerella): NEGATIVE
Candida Glabrata: NEGATIVE
Candida Vaginitis: NEGATIVE
Comment: NEGATIVE
Comment: NEGATIVE
Comment: NEGATIVE

## 2023-09-29 DIAGNOSIS — M5093 Cervical disc disorder, unspecified, cervicothoracic region: Secondary | ICD-10-CM | POA: Diagnosis not present

## 2023-09-29 DIAGNOSIS — M5459 Other low back pain: Secondary | ICD-10-CM | POA: Diagnosis not present

## 2023-09-29 DIAGNOSIS — G4486 Cervicogenic headache: Secondary | ICD-10-CM | POA: Diagnosis not present

## 2023-09-29 DIAGNOSIS — R6884 Jaw pain: Secondary | ICD-10-CM | POA: Diagnosis not present

## 2023-10-04 DIAGNOSIS — M5459 Other low back pain: Secondary | ICD-10-CM | POA: Diagnosis not present

## 2023-10-04 DIAGNOSIS — G4486 Cervicogenic headache: Secondary | ICD-10-CM | POA: Diagnosis not present

## 2023-10-04 DIAGNOSIS — R6884 Jaw pain: Secondary | ICD-10-CM | POA: Diagnosis not present

## 2023-10-04 DIAGNOSIS — M5093 Cervical disc disorder, unspecified, cervicothoracic region: Secondary | ICD-10-CM | POA: Diagnosis not present

## 2023-10-06 DIAGNOSIS — R6884 Jaw pain: Secondary | ICD-10-CM | POA: Diagnosis not present

## 2023-10-06 DIAGNOSIS — M5093 Cervical disc disorder, unspecified, cervicothoracic region: Secondary | ICD-10-CM | POA: Diagnosis not present

## 2023-10-06 DIAGNOSIS — G4486 Cervicogenic headache: Secondary | ICD-10-CM | POA: Diagnosis not present

## 2023-10-06 DIAGNOSIS — M5459 Other low back pain: Secondary | ICD-10-CM | POA: Diagnosis not present

## 2023-10-11 ENCOUNTER — Other Ambulatory Visit: Payer: Self-pay

## 2023-10-11 DIAGNOSIS — Z86718 Personal history of other venous thrombosis and embolism: Secondary | ICD-10-CM

## 2023-10-12 ENCOUNTER — Inpatient Hospital Stay: Payer: BC Managed Care – PPO

## 2023-10-12 ENCOUNTER — Inpatient Hospital Stay: Payer: BC Managed Care – PPO | Admitting: Hematology

## 2023-10-12 ENCOUNTER — Inpatient Hospital Stay: Payer: BC Managed Care – PPO | Attending: Hematology

## 2023-10-12 ENCOUNTER — Other Ambulatory Visit: Payer: Self-pay

## 2023-10-12 VITALS — BP 118/68 | HR 91 | Temp 98.1°F | Resp 16 | Wt 221.0 lb

## 2023-10-12 DIAGNOSIS — Z79899 Other long term (current) drug therapy: Secondary | ICD-10-CM | POA: Insufficient documentation

## 2023-10-12 DIAGNOSIS — O99353 Diseases of the nervous system complicating pregnancy, third trimester: Secondary | ICD-10-CM | POA: Insufficient documentation

## 2023-10-12 DIAGNOSIS — E538 Deficiency of other specified B group vitamins: Secondary | ICD-10-CM | POA: Insufficient documentation

## 2023-10-12 DIAGNOSIS — O30043 Twin pregnancy, dichorionic/diamniotic, third trimester: Secondary | ICD-10-CM | POA: Insufficient documentation

## 2023-10-12 DIAGNOSIS — Z881 Allergy status to other antibiotic agents status: Secondary | ICD-10-CM | POA: Insufficient documentation

## 2023-10-12 DIAGNOSIS — M7989 Other specified soft tissue disorders: Secondary | ICD-10-CM | POA: Insufficient documentation

## 2023-10-12 DIAGNOSIS — Z86718 Personal history of other venous thrombosis and embolism: Secondary | ICD-10-CM | POA: Diagnosis not present

## 2023-10-12 DIAGNOSIS — R002 Palpitations: Secondary | ICD-10-CM | POA: Diagnosis not present

## 2023-10-12 DIAGNOSIS — Z8249 Family history of ischemic heart disease and other diseases of the circulatory system: Secondary | ICD-10-CM | POA: Diagnosis not present

## 2023-10-12 DIAGNOSIS — O99283 Endocrine, nutritional and metabolic diseases complicating pregnancy, third trimester: Secondary | ICD-10-CM | POA: Insufficient documentation

## 2023-10-12 DIAGNOSIS — Z886 Allergy status to analgesic agent status: Secondary | ICD-10-CM | POA: Diagnosis not present

## 2023-10-12 DIAGNOSIS — O99013 Anemia complicating pregnancy, third trimester: Secondary | ICD-10-CM | POA: Insufficient documentation

## 2023-10-12 DIAGNOSIS — D649 Anemia, unspecified: Secondary | ICD-10-CM

## 2023-10-12 DIAGNOSIS — Z7982 Long term (current) use of aspirin: Secondary | ICD-10-CM | POA: Diagnosis not present

## 2023-10-12 DIAGNOSIS — Z832 Family history of diseases of the blood and blood-forming organs and certain disorders involving the immune mechanism: Secondary | ICD-10-CM | POA: Diagnosis not present

## 2023-10-12 DIAGNOSIS — Z3A21 21 weeks gestation of pregnancy: Secondary | ICD-10-CM | POA: Diagnosis not present

## 2023-10-12 DIAGNOSIS — O99111 Other diseases of the blood and blood-forming organs and certain disorders involving the immune mechanism complicating pregnancy, first trimester: Secondary | ICD-10-CM | POA: Diagnosis not present

## 2023-10-12 DIAGNOSIS — E611 Iron deficiency: Secondary | ICD-10-CM | POA: Diagnosis not present

## 2023-10-12 DIAGNOSIS — D6851 Activated protein C resistance: Secondary | ICD-10-CM | POA: Diagnosis not present

## 2023-10-12 DIAGNOSIS — Z880 Allergy status to penicillin: Secondary | ICD-10-CM | POA: Diagnosis not present

## 2023-10-12 DIAGNOSIS — O99113 Other diseases of the blood and blood-forming organs and certain disorders involving the immune mechanism complicating pregnancy, third trimester: Secondary | ICD-10-CM | POA: Diagnosis not present

## 2023-10-12 LAB — CMP (CANCER CENTER ONLY)
ALT: 30 U/L (ref 0–44)
AST: 17 U/L (ref 15–41)
Albumin: 3.3 g/dL — ABNORMAL LOW (ref 3.5–5.0)
Alkaline Phosphatase: 54 U/L (ref 38–126)
Anion gap: 4 — ABNORMAL LOW (ref 5–15)
BUN: 9 mg/dL (ref 6–20)
CO2: 25 mmol/L (ref 22–32)
Calcium: 8.5 mg/dL — ABNORMAL LOW (ref 8.9–10.3)
Chloride: 105 mmol/L (ref 98–111)
Creatinine: 0.67 mg/dL (ref 0.44–1.00)
GFR, Estimated: >60 mL/min (ref >60)
Glucose, Bld: 89 mg/dL (ref 70–99)
Potassium: 3.7 mmol/L (ref 3.5–5.1)
Sodium: 134 mmol/L — ABNORMAL LOW (ref 135–145)
Total Bilirubin: 0.2 mg/dL (ref 0.0–1.2)
Total Protein: 6.2 g/dL — ABNORMAL LOW (ref 6.5–8.1)

## 2023-10-12 LAB — CBC WITH DIFFERENTIAL (CANCER CENTER ONLY)
Abs Immature Granulocytes: 0.05 10*3/uL (ref 0.00–0.07)
Basophils Absolute: 0 10*3/uL (ref 0.0–0.1)
Basophils Relative: 0 %
Eosinophils Absolute: 0 10*3/uL (ref 0.0–0.5)
Eosinophils Relative: 1 %
HCT: 32.4 % — ABNORMAL LOW (ref 36.0–46.0)
Hemoglobin: 10.9 g/dL — ABNORMAL LOW (ref 12.0–15.0)
Immature Granulocytes: 1 %
Lymphocytes Relative: 30 %
Lymphs Abs: 1.7 10*3/uL (ref 0.7–4.0)
MCH: 34.3 pg — ABNORMAL HIGH (ref 26.0–34.0)
MCHC: 33.6 g/dL (ref 30.0–36.0)
MCV: 101.9 fL — ABNORMAL HIGH (ref 80.0–100.0)
Monocytes Absolute: 0.5 10*3/uL (ref 0.1–1.0)
Monocytes Relative: 9 %
Neutro Abs: 3.4 10*3/uL (ref 1.7–7.7)
Neutrophils Relative %: 59 %
Platelet Count: 198 10*3/uL (ref 150–400)
RBC: 3.18 MIL/uL — ABNORMAL LOW (ref 3.87–5.11)
RDW: 13.3 % (ref 11.5–15.5)
WBC Count: 5.6 10*3/uL (ref 4.0–10.5)
nRBC: 0 % (ref 0.0–0.2)

## 2023-10-12 LAB — IRON AND IRON BINDING CAPACITY (CC-WL,HP ONLY)
Iron: 126 ug/dL (ref 28–170)
Saturation Ratios: 32 % — ABNORMAL HIGH (ref 10.4–31.8)
TIBC: 389 ug/dL (ref 250–450)
UIBC: 263 ug/dL (ref 148–442)

## 2023-10-12 LAB — VITAMIN B12: Vitamin B-12: 298 pg/mL (ref 180–914)

## 2023-10-12 LAB — T4, FREE: Free T4: 0.52 ng/dL — ABNORMAL LOW (ref 0.61–1.12)

## 2023-10-12 LAB — FOLATE: Folate: 19.1 ng/mL (ref >5.9)

## 2023-10-12 LAB — MAGNESIUM: Magnesium: 1.7 mg/dL (ref 1.7–2.4)

## 2023-10-12 LAB — RETICULOCYTES
Immature Retic Fract: 13.1 % (ref 2.3–15.9)
RBC.: 3.22 MIL/uL — ABNORMAL LOW (ref 3.87–5.11)
Retic Count, Absolute: 116.2 10*3/uL (ref 19.0–186.0)
Retic Ct Pct: 3.6 % — ABNORMAL HIGH (ref 0.4–3.1)

## 2023-10-12 LAB — FERRITIN: Ferritin: 14 ng/mL (ref 11–307)

## 2023-10-12 LAB — LACTATE DEHYDROGENASE: LDH: 130 U/L (ref 98–192)

## 2023-10-12 NOTE — Progress Notes (Signed)
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                       (Signed Final 09/24/2023 12:59 pm) ---------------------------------------------------------------------- Patient Info  ID #:       [id number]                          D.O.B.:  Jul 02, 1996 (27 yrs)(F)  Name:       Melissa Boyd                  Visit Date: 09/24/2023 10:29 am ---------------------------------------------------------------------- Performed By  Attending:        Noralee Space MD        Ref. Address:     [address]                                                             [place], [place]  [address]  Performed By:     Reinaldo Raddle            [facility]  Referred By:      [facility] ---------------------------------------------------------------------- Orders  #  Description                           Code        Ordered By  1  [place] MFM OB DETAIL +14 WK               76811.01    PAULA DUNCAN  2  [place] MFM OB DETAIL ADDL GEST            76811.02    PAULA DUNCAN     +14 WK ----------------------------------------------------------------------  #  Order #                     Accession #                Episode #  1  [id number]                   [id number]                 [id number]  2  [id number]                   [id number]                 [id number] ---------------------------------------------------------------------- Indications  Twin pregnancy, di/di, second trimester        O30.042  Medical complication of pregnancy (DVT on      O26.90  Lovenox)  Obesity complicating pregnancy, second         O99.212  trimester (BMI 31)  [redacted] weeks gestation of pregnancy                Z3A.19  LR NIPS, Neg AFP, Neg Horizon ---------------------------------------------------------------------- Fetal Evaluation (Fetus A)  Num Of Fetuses:         2  Fetal Heart Rate(bpm):  153  Cardiac Activity:       Observed  Fetal Lie:  Maternal left side  Presentation:           Breech  Placenta:               Posterior  P. Cord Insertion:      Visualized, central  Membrane Desc:      Dividing Membrane seen - Dichorionic.  Amniotic Fluid  AFI FV:      Within normal limits                              Largest Pocket(cm)                              4.84 ---------------------------------------------------------------------- Biometry (Fetus A)  BPD:      47.3  mm     G. Age:  20w 2d         87  %    CI:        73.35   %    70 - 86                                                          FL/HC:      16.9   %    16.1 - 18.3  HC:      175.5  mm     G. Age:  20w 0d         78  %    HC/AC:      1.20        1.09 -  1.39  AC:      146.5  mm     G. Age:  20w 0d         68  %    FL/BPD:     62.8   %  FL:       29.7  mm     G. Age:  19w 1d         38  %    FL/AC:      20.3   %    20 - 24  HUM:      29.9  mm     G. Age:  19w 6d         65  %  CER:      21.3  mm     G. Age:  20w 1d         86  %  NFT:       5.1  mm  LV:        4.6  mm  CM:        6.7  mm  Est. FW:     305  gm    0 lb 11 oz      68  %     FW Discordancy      0 \ 9 % ---------------------------------------------------------------------- OB History  Gravidity:    1  Living:       0 ---------------------------------------------------------------------- Gestational Age (Fetus A)  LMP:           19w 6d        Date:  05/08/23  EDD:   02/12/24  U/S Today:     19w 6d                                        EDD:   02/12/24  Best:          19w 2d     Det. By:  Kenney Houseman.  (06/30/23)   EDD:   02/16/24 ---------------------------------------------------------------------- Targeted Anatomy (Fetus A)  Central Nervous System  Calvarium/Cranial V.:  Appears normal         Cereb./Vermis:          Appears normal  Cavum:                 Appears normal         Cisterna Magna:         Appears normal  Lateral Ventricles:    Appears normal         Midline Falx:           Appears normal  Choroid Plexus:        Appears normal  Spine  Cervical:              Appears normal         Sacral:                 Appears normal  Thoracic:              Appears normal         Shape/Curvature:        Appears normal  Lumbar:                Appears normal  Head/Neck  Lips:                  Appears normal         Profile:                Appears normal  Neck:                  Appears normal         Orbits/Eyes:            Appears normal  Nuchal Fold:           Appears normal         Mandible:               Appears normal  Nasal Bone:            Present                Maxilla:                Appears normal  Thorax  4 Chamber View:        Appears normal         Interventr. Septum:     Appears normal   Cardiac Rhythm:        Normal                 Cardiac Axis:           Normal  Cardiac Situs:         Appears normal         Diaphragm:              Appears normal  Rt Outflow Tract:  Appears normal         3 Vessel View:          Appears normal  Lt Outflow Tract:      Appears normal         3 V Trachea View:       Appears normal  Aortic Arch:           Appears normal         IVC:                    Appears normal  Ductal Arch:           Appears normal         Crossing:               Appears normal  SVC:                   Appears normal  Abdomen  Ventral Wall:          Appears normal         Lt Kidney:              Appears normal  Cord Insertion:        Appears normal         Rt Kidney:              Appears normal  Situs:                 Appears normal         Bladder:                Appears normal  Stomach:               Appears normal  Extremities  Lt Humerus:            Appears normal         Lt Femur:               Appears normal  Rt Humerus:            Appears normal         Rt Femur:               Appears normal  Lt Forearm:            Appears normal         Lt Lower Leg:           Appears normal  Rt Forearm:            Appears normal         Rt Lower Leg:           Appears normal  Lt Hand:               Visualized             Lt Foot:                Not well visualized  Rt Hand:               Visualized             Rt Foot:                Not well visualized  Other  Umbilical Cord:        Normal 3-vessel        Genitalia:  Female-nml ---------------------------------------------------------------------- Fetal Evaluation (Fetus B)  Num Of Fetuses:         2  Preg. Location:         [place]  Fetal Heart Rate(bpm):  160  Cardiac Activity:       Observed  Fetal Lie:              Maternal right side  Presentation:           Cephalic  Placenta:               Anterior  P. Cord Insertion:      Visualized, central  Membrane Desc:      Dividing Membrane seen - Dichorionic.  Amniotic Fluid  AFI FV:      Within  normal limits                              Largest Pocket(cm)                              3.8 ---------------------------------------------------------------------- Biometry (Fetus B)  BPD:      44.2  mm     G. Age:  19w 3d         54  %    CI:        72.06   %    70 - 86                                                          FL/HC:      17.7   %    16.1 - 18.3  HC:      165.7  mm     G. Age:  19w 2d         42  %    HC/AC:      1.20        1.09 - 1.39  AC:      138.4  mm     G. Age:  19w 2d         44  %    FL/BPD:     66.5   %  FL:       29.4  mm     G. Age:  19w 0d         34  %    FL/AC:      21.2   %    20 - 24  HUM:        28  mm     G. Age:  19w 0d         44  %  CER:      20.4  mm     G. Age:  19w 4d         62  %  NFT:       4.8  mm  LV:        7.7  mm  CM:        2.2  mm  Est. FW:     278  gm    0 lb 10 oz      39  %     FW Discordancy  9  % ---------------------------------------------------------------------- Gestational Age (Fetus B)  LMP:           19w 6d        Date:  05/08/23                  EDD:   02/12/24  U/S Today:     19w 2d                                        EDD:   02/16/24  Best:          19w 2d     Det. By:  Kenney Houseman.  (06/30/23)   EDD:   02/16/24 ---------------------------------------------------------------------- Targeted Anatomy (Fetus B)  Central Nervous System  Calvarium/Cranial V.:  Appears normal         Cereb./Vermis:          Appears normal  Cavum:                 Appears normal         Cisterna Magna:         Appears normal  Lateral Ventricles:    Appears normal         Midline Falx:           Appears normal  Choroid Plexus:        Appears normal  Spine  Cervical:              Appears normal         Sacral:                 Appears normal  Thoracic:              Appears normal         Shape/Curvature:        Appears normal  Lumbar:                Appears normal  Head/Neck  Lips:                  Appears normal         Profile:                Appears normal  Neck:                   Appears normal         Orbits/Eyes:            Appears normal  Nuchal Fold:           Appears normal         Mandible:               Appears normal  Nasal Bone:            Present                Maxilla:                Appears normal  Thorax  4 Chamber View:        Appears normal; EIF    Interventr. Septum:     Appears normal  Cardiac Rhythm:        Normal                 Cardiac Axis:  Normal  Cardiac Situs:         Appears normal         Diaphragm:              Appears normal  Rt Outflow Tract:      Appears normal         3 Vessel View:          Appears normal  Lt Outflow Tract:      Appears normal         3 V Trachea View:       Appears normal  Aortic Arch:           Appears normal         IVC:                    Appears normal  Ductal Arch:           Appears normal         Crossing:               Appears normal  SVC:                   Appears normal  Abdomen  Ventral Wall:          Appears normal         Lt Kidney:              Appears normal  Cord Insertion:        Appears normal         Rt Kidney:              Appears normal  Situs:                 Appears normal         Bladder:                Appears normal  Stomach:               Appears normal  Extremities  Lt Humerus:            Appears normal         Lt Femur:               Appears normal  Rt Humerus:            Appears normal         Rt Femur:               Appears normal  Lt Forearm:            Appears normal         Lt Lower Leg:           Appears normal  Rt Forearm:            Appears normal         Rt Lower Leg:           Appears normal  Lt Hand:               Visualized             Lt Foot:                Not well visualized  Rt Hand:               Open hand nml          Rt Foot:  Not well visualized  Other  Umbilical Cord:        Normal 3-vessel        Genitalia:              Female-nml ---------------------------------------------------------------------- Cervix Uterus Adnexa  Cervix  Length:              3  cm.   Normal appearance by transabdominal scan  Uterus  No abnormality visualized.  Right Ovary  Not visualized.  Left Ovary  Not visualized.  Cul De Sac  No free fluid seen.  Adnexa  No adnexal mass visualized ---------------------------------------------------------------------- Impression  Twin A: Maternal left, breech presentation, posterior placenta,  female fetus.  Amniotic fluid is normal and good fetal activity  seen.  Fetal biometry is consistent with the previously  established dates.  No markers of aneuploidies or obvious  fetal structural defects are seen.  Twin B: Maternal right, cephalic presentation, anterior  placenta, female fetus. Amniotic fluid is normal and good fetal  activity seen.  Fetal biometry is consistent with the previously  established dates.  An echogenic intracardiac focus is seen.  No other markers of aneuploidies or obvious fetal structural  defects are seen.  Biometry discordance: 9% (normal).  xxxxxxxxxxxxxxxxxxxxxxxxxxxxxxxxxxxxxxxxxxxxxx  Consultation  I had the [facility] of [facility]sey [facility]  Patient was accompanied by her  mother.  She is G1 P0 at 69w 2d gestation with dichorionic-  diamniotic twins confirmed at first trimester ultrasound.  On cell-free fetal DNA screening, the risks of fetal  aneuploidies are not increased.  MSAFP screening showed  low risk for open neural tube defects.  Carrier screening is  negative.  Past medical history significant for deep vein thrombosis in  2016 after a prolonged car ride.  Patient was taking oral  contraceptives.  She had a hematology consultation in this  pregnancy.  Patient takes Lovenox 60 mg daily.  Her pregnancy is dated by 7-week ultrasound.  Subchorionic  hemorrhage was seen.  Dichorionic-diamniotic twin pregnancy  I explained the significance of chorionicity and its  implications. Possible complications associated with twin  pregnancy are preterm labor/delivery (most common), fetal  growth  restriction of one or both twins, miscarriage,  malpresentations and increased cesarean delivery rate,  postpartum hemorrhage. I also informed her that maternal  complications including gestational diabetes and gestational  hypertension/preeclampsia are more common.  I discussed the mode of delivery that is based on the  presentations.  If both have Vx/Vx or Vx/non-vertex  presentations, vaginal delivery may be considered. In Vx/non-  vx, vaginal delivery followed by internal podalic version of  second twin will achieve vaginal delivery. In non-vx first twin  presentation, cesarean delivery will be performed.  I emphasized the importance of weight gain (24 lbs by 24  weeks) to improve fetal weight and decrease the chances of  preterm delivery.  Prophylactic low-dose aspirin delays or prevents  preeclampsia. Twin pregnancy is at high risk for  preeclampsia. I recommended aspirin (81 mg daily) to be  started from now till delivery. She does not have  contraindications to aspirin.  History of deep vein thrombosis  I recommend continuing heparin anticoagulation in this  pregnancy.  Patient was taking oral contraceptives and her  history of DVT was hormonally related in addition to the  prolonged car ride.  I explained that Lovenox will be  discontinued at 36 to [redacted] weeks gestation to facilitate epidural  analgesia.  Patient  does not have any side effects to Lovenox. ---------------------------------------------------------------------- Recommendations  -An appointment was made for her to return in 4 weeks for  completion of fetal anatomy and fetal growth assessment.  -Serial fetal growth assessments every 4 weeks.  Weekly BPP at 36 and 37 weeks  -Delivery at [redacted] weeks gestation.  -Aspirin 81 mg daily from now till delivery. ----------------------------------------------------------------------                 Noralee Space, MD Electronically Signed Final Report   09/24/2023 12:59 pm  ----------------------------------------------------------------------    ASSESSMENT & PLAN:   28 y.o. 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 2. FHX of Factor V leiden mutation  PLAN: -patient is currently at 22 weeks of pregnancy. -Discussed lab results from today, 10/12/2023, in detail with the patient. CBC shows mild anemia with hemoglobin of 10.9 g/dL with hematocrit of 60.4%. CMP stable -Discussed the option of additional lab work-up today for Vitamin B-12 level, Magnesium level, and thyroid levels. Pt agrees.  -Continue to follow-up with OBGYN.  -Continue Lovenox [place] 60mg  daily during pregnancy and for 6 weeks post partum.  -no indication for long term anticoagulation outside of these recommendations. -Continue Aspirin 81 mg daily. -Answered all of patient's questions.  -Recommend two pre-natal vitamin pills daily.  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 No medication. Patient has Upcoming Cardiologist appointment. Recommend to call cardiologist earlier if she has more palpitations.   FOLLOW-UP: Additional labs today RTC with Dr Candise Che with labs in 10 weeks  The total time spent in the appointment was 30 minutes* .  All of the patient's questions were answered with apparent satisfaction. The patient knows to call the clinic with any problems, questions or concerns.   Wyvonnia Lora MD MS AAHIVMS [facility] Hematology/Oncology Physician [facility]  .*Total Encounter Time as defined by the Centers for Medicare and Medicaid Services includes, in addition to the face-to-face time of a patient visit (documented in the note above) non-face-to-face time: obtaining and reviewing outside history, ordering and reviewing medications, tests or procedures, care coordination (communications with other health care professionals or caregivers) and  documentation in the medical record.   I,Param Shah,acting as a Neurosurgeon for Wyvonnia Lora, MD.,have documented all relevant documentation on the behalf of Wyvonnia Lora, MD,as directed by  Wyvonnia Lora, MD while in the presence of Wyvonnia Lora, MD.  .I have reviewed the above documentation for accuracy and completeness, and I agree with the above. Johney Maine MD   ADDENDUM  Component     Latest Ref Rng 10/12/2023  Retic Ct Pct     0.4 - 3.1 % 3.6 (H)   RBC.     3.87 - 5.11 MIL/uL 3.22 (L)   Retic Count, Absolute     19.0 - 186.0 K/uL 116.2   Immature Retic Fract     2.3 - 15.9 % 13.1   Ferritin     11 - 307 ng/mL 14   Haptoglobin     33 - 278 mg/dL 90   LDH     98 - 540 U/L 130   Magnesium     1.7 - 2.4 mg/dL 1.7   J8,JXBJ(YNWGNF)     0.61 - 1.12 ng/dL 6.21 (L)   TSH     3.086 - 4.500 uIU/mL 1.717   Folate     >5.9 ng/mL 19.1  Vitamin B12     180 - 914 pg/mL 298     Legend: (H) High (L) Low   Mild Iron and B12 deficiency FT4 borderline low but TSH WNL Plan -continue prenatal vitamins 2 tabs daily -add B12 SL daily -start Iron polysaccharide 150mg  po daily -recheck labs in 6 weeks with Dr Candise Che

## 2023-10-13 ENCOUNTER — Encounter: Payer: Self-pay | Admitting: Obstetrics and Gynecology

## 2023-10-13 DIAGNOSIS — M5093 Cervical disc disorder, unspecified, cervicothoracic region: Secondary | ICD-10-CM | POA: Diagnosis not present

## 2023-10-13 DIAGNOSIS — G4486 Cervicogenic headache: Secondary | ICD-10-CM | POA: Diagnosis not present

## 2023-10-13 DIAGNOSIS — R6884 Jaw pain: Secondary | ICD-10-CM | POA: Diagnosis not present

## 2023-10-13 DIAGNOSIS — M5459 Other low back pain: Secondary | ICD-10-CM | POA: Diagnosis not present

## 2023-10-13 LAB — TSH: TSH: 1.717 u[IU]/mL (ref 0.350–4.500)

## 2023-10-13 LAB — HAPTOGLOBIN: Haptoglobin: 90 mg/dL (ref 33–278)

## 2023-10-15 ENCOUNTER — Encounter: Payer: Self-pay | Admitting: Cardiology

## 2023-10-15 ENCOUNTER — Ambulatory Visit: Payer: BC Managed Care – PPO | Attending: Cardiology

## 2023-10-15 ENCOUNTER — Ambulatory Visit (INDEPENDENT_AMBULATORY_CARE_PROVIDER_SITE_OTHER): Payer: BC Managed Care – PPO | Admitting: Cardiology

## 2023-10-15 VITALS — BP 118/70 | HR 107 | Ht 67.0 in | Wt 219.7 lb

## 2023-10-15 DIAGNOSIS — M5459 Other low back pain: Secondary | ICD-10-CM | POA: Diagnosis not present

## 2023-10-15 DIAGNOSIS — R6884 Jaw pain: Secondary | ICD-10-CM | POA: Diagnosis not present

## 2023-10-15 DIAGNOSIS — I471 Supraventricular tachycardia, unspecified: Secondary | ICD-10-CM | POA: Diagnosis not present

## 2023-10-15 DIAGNOSIS — G4486 Cervicogenic headache: Secondary | ICD-10-CM | POA: Diagnosis not present

## 2023-10-15 DIAGNOSIS — M5093 Cervical disc disorder, unspecified, cervicothoracic region: Secondary | ICD-10-CM | POA: Diagnosis not present

## 2023-10-15 DIAGNOSIS — R0609 Other forms of dyspnea: Secondary | ICD-10-CM

## 2023-10-15 NOTE — Patient Instructions (Addendum)
 Medication Instructions:  Your physician recommends that you continue on your current medications as directed. Please refer to the Current Medication list given to you today.  *If you need a refill on your cardiac medications before your next appointment, please call your pharmacy*  Testing/Procedures: Your physician has requested that you have an echocardiogram - OB. Echocardiography is a painless test that uses sound waves to create images of your heart. It provides your doctor with information about the size and shape of your heart and how well your heart's chambers and valves are working. This procedure takes approximately one hour. There are no restrictions for this procedure. Please do NOT wear cologne, perfume, aftershave, or lotions (deodorant is allowed). Please arrive 15 minutes prior to your appointment time.  Please note: We ask at that you not bring children with you during ultrasound (echo/ vascular) testing. Due to room size and safety concerns, children are not allowed in the ultrasound rooms during exams. Our front office staff cannot provide observation of children in our lobby area while testing is being conducted. An adult accompanying a patient to their appointment will only be allowed in the ultrasound room at the discretion of the ultrasound technician under special circumstances. We apologize for any inconvenience.  ZIO XT- Long Term Monitor Instructions  Your physician has requested you wear a ZIO patch monitor for 14 days.  This is a single patch monitor. [employer] supplies one patch monitor per enrollment. Additional stickers are not available. Please do not apply patch if you will be having a Nuclear Stress Test,  Echocardiogram, Cardiac CT, MRI, or Chest Xray during the period you would be wearing the  monitor. The patch cannot be worn during these tests. You cannot remove and re-apply the  ZIO XT patch monitor.  Your ZIO patch monitor will be mailed 3 day USPS to your  address on file. It may take 3-5 days  to receive your monitor after you have been enrolled.  Once you have received your monitor, please review the enclosed instructions. Your monitor  has already been registered assigning a specific monitor serial # to you.  Billing and Patient Assistance Program Information  We have supplied [employer] with any of your insurance information on file for billing purposes. [employer] offers a sliding scale Patient Assistance Program for patients that do not have  insurance, or whose insurance does not completely cover the cost of the ZIO monitor.  You must apply for the Patient Assistance Program to qualify for this discounted rate.  To apply, please call [employer] at [phone], select option 4, select option 2, ask to apply for  Patient Assistance Program. Meredeth Ide will ask your household income, and how many people  are in your household. They will quote your out-of-pocket cost based on that information.  [employer] will also be able to set up a 9-month, interest-free payment plan if needed.  Applying the monitor   Shave hair from upper left chest.  Hold abrader disc by orange tab. Rub abrader in 40 strokes over the upper left chest as  indicated in your monitor instructions.  Clean area with 4 enclosed alcohol pads. Let dry.  Apply patch as indicated in monitor instructions. Patch will be placed under collarbone on left  side of chest with arrow pointing upward.  Rub patch adhesive wings for 2 minutes. Remove white label marked "1". Remove the white  label marked "2". Rub patch adhesive wings for 2 additional minutes.  While looking in a mirror, press and  release button in center of patch. A small green light will  flash 3-4 times. This will be your only indicator that the monitor has been turned on.  Do not shower for the first 24 hours. You may shower after the first 24 hours.  Press the button if you feel a symptom. You will hear a small click. Record  Date, Time and  Symptom in the Patient Logbook.  When you are ready to remove the patch, follow instructions on the last 2 pages of Patient  Logbook. Stick patch monitor onto the last page of Patient Logbook.  Place Patient Logbook in the blue and white box. Use locking tab on box and tape box closed  securely. The blue and white box has prepaid postage on it. Please place it in the mailbox as  soon as possible. Your physician should have your test results approximately 7 days after the  monitor has been mailed back to [facility].  Call [facility] Customer Care at [phone] if you have questions regarding  your ZIO XT patch monitor. Call them immediately if you see an orange light blinking on your  monitor.  If your monitor falls off in less than 4 days, contact our Monitor department at [phone].  If your monitor becomes loose or falls off after 4 days call [employer] at [phone] for  suggestions on securing your monitor     Follow-Up: At [facility], you and your health needs are our priority.  As part of our continuing mission to provide you with exceptional heart care, we have created designated Provider Care Teams.  These Care Teams include your primary Cardiologist (physician) and Advanced Practice Providers (APPs -  Physician Assistants and Nurse Practitioners) who all work together to provide you with the care you need, when you need it.  Your next appointment:   6 week(s)  Provider:   Thomasene Ripple, DO   Other Instructions:

## 2023-10-15 NOTE — Progress Notes (Unsigned)
 Enrolled patient for a 14 day Zio XT  monitor to be mailed to patients home

## 2023-10-17 NOTE — Progress Notes (Signed)
Cardio-Obstetrics Clinic  New Evaluation  Date:  10/17/2023   ID:  [name], DOB [birth year], MRN [id number]  PCP:  Sigmund Hazel, MD   [facility] Providers Cardiologist:  None  Electrophysiologist:  None       Referring MD: Sue Lush, FNP   Chief Complaint: " I am having palpitations"  History of Present Illness:    Melissa Boyd is a 28 y.o. female [G1P0] who is being seen today for the evaluation of palpitations at the request of Sue Lush, FNP.   She is currently pregnant with twins, presents with episodes of palpitations, shortness of breath, and feeling faint. She reports that these episodes are random and can occur at any time, even when at rest. During these episodes, the patient describes feeling hot and sweaty, with a sensation of suffocation and a racing heart. She also reports feeling drained after these episodes, as if she had run a marathon. The patient has a history of supraventricular tachycardia (SVT) and is currently on Lovenox for a history of blood clots. She also reports that her blood pressure, which is usually on the lower side, has been higher than usual recently. The patient is concerned about the impact of these episodes on her health and the health of her unborn twins.   Prior CV Studies Reviewed: The following studies were reviewed today: None   Past Medical History:  Diagnosis Date   Allergy    Phreesia 04/29/2020   DVT (deep venous thrombosis) (HCC)    Headache, migraine    Ovarian cyst 05/23/2023   Subchorionic hematoma    Urticaria     Past Surgical History:  Procedure Laterality Date   SHOULDER SURGERY     left   WISDOM TOOTH EXTRACTION        OB History     Gravida  1   Para      Term      Preterm      AB      Living         SAB      IAB      Ectopic      Multiple  1   Live Births                  Current Medications: Current Meds  Medication Sig   ASPIRIN 81 PO Take 81  mg by mouth daily.   enoxaparin (LOVENOX) 60 MG/0.6ML injection Inject 0.6 mLs (60 mg total) into the skin daily.   EPINEPHrine (AUVI-Q) 0.3 mg/0.3 mL IJ SOAJ injection Use as directed for severe allergic reactions   Prenatal Vit-Fe Fumarate-FA (PRENATAL MULTIVITAMIN) TABS tablet Take 1 tablet by mouth daily at 12 noon.   SUMAtriptan (IMITREX) 20 MG/ACT nasal spray Place 1 spray (20 mg total) into the nose every 2 (two) hours as needed for migraine or headache. May repeat in 2 hours if headache persists or recurs.     Allergies:   Other, Shellfish allergy, Naproxen, Amoxicillin, and Doxycycline   Social History   Socioeconomic History   Marital status: Single    Spouse name: Not on file   Number of children: Not on file   Years of education: Not on file   Highest education level: Not on file  Occupational History   Not on file  Tobacco Use   Smoking status: Never   Smokeless tobacco: Never  Vaping Use   Vaping status: Never Used  Substance and Sexual Activity  Alcohol use: Not Currently   Drug use: No   Sexual activity: Yes    Birth control/protection: None  Other Topics Concern   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 She enjoys track and field.   Lives in [place].    Right handed   Caffeine: maybe once a week. Neita Carp)      Social Drivers of Health   Financial Resource Strain: Not on file  Food Insecurity: No Food Insecurity (07/31/2023)   Hunger Vital Sign    Worried About Running Out of Food in the Last Year: Never true    Ran Out of Food in the Last Year: Never true  Transportation Needs: No Transportation Needs (07/31/2023)   PRAPARE - Administrator, Civil Service (Medical): No    Lack of Transportation (Non-Medical): No  Physical Activity: Not on file  Stress: Not on file  Social Connections: Not on file      Family History   Problem Relation Age of Onset   Anemia Father        low iron    Alzheimer's disease Paternal Grandfather    Migraines Brother    Hypertension Maternal Aunt    Migraines Cousin        Mother's 1st cousin; has them really bad    Migraines Cousin       ROS:   Please see the history of present illness.    Palpitatios All other systems reviewed and are negative.   Labs/EKG Reviewed:    EKG:   EKG was not ordered today.  The ekg ordered today demonstrates   Recent Labs: 10/12/2023: ALT 30; BUN 9; Creatinine 0.67; Hemoglobin 10.9; Magnesium 1.7; Platelet Count 198; Potassium 3.7; Sodium 134; TSH 1.717   Recent Lipid Panel No results found for: "CHOL", "TRIG", "HDL", "CHOLHDL", "LDLCALC", "LDLDIRECT"  Physical Exam:    VS:  BP 118/70 (BP Location: Right Arm)   Pulse (!) 107   Ht 5\' 7"  (1.702 m)   Wt 219 lb 11.2 oz (99.7 kg)   LMP 05/08/2023 (Exact Date) Comment: + pregnancy test 9/29, irreg cycles  SpO2 97%   BMI 34.41 kg/m     Wt Readings from Last 3 Encounters:  10/15/23 219 lb 11.2 oz (99.7 kg)  10/12/23 221 lb (100.2 kg)  09/24/23 214 lb (97.1 kg)     GEN:  Well nourished, well developed in no acute distress HEENT: Normal NECK: No JVD; No carotid bruits LYMPHATICS: No lymphadenopathy CARDIAC: RRR, no murmurs, rubs, gallops RESPIRATORY:  Clear to auscultation without rales, wheezing or rhonchi  ABDOMEN: Soft, non-tender, non-distended MUSCULOSKELETAL:  No edema; No deformity  SKIN: Warm and dry NEUROLOGIC:  Alert and oriented x 3 PSYCHIATRIC:  Normal affect    Risk Assessment/Risk Calculators:     CARPREG II Risk Prediction Index Score:  1.  The patient's risk for a primary cardiac event is 5%.   Modified World Health Organization [facility]) Classification of Maternal CV Risk   Class I         ASSESSMENT & PLAN:    Supraventricular Tachycardia (SVT) Recurrent episodes of palpitations, shortness of breath, and feeling of suffocation. History of SVT in  2015 with heart rate reaching 200 bpm. Recent episodes associated with increased heart rate on Apple watch (up to 156 bpm) and subjective increase in blood pressure. Currently pregnant. -Order 2-week heart monitor to  capture episodes and confirm SVT. -Schedule echocardiogram to assess cardiac structure and function. -Advise patient on vagal maneuvers (bearing down or cold water facial immersion) to attempt during episodes. -Plan to review monitor results in 6 weeks and consider medication if necessary.  [redacted] weeks gestation  Currently on Lovenox for history of blood clots. No current complications noted. -Continue current management. -Advise patient that baby receives oxygen supply first during episodes of perceived shortness of breath.  Hypertension Patient reports recent increase in blood pressure readings at home, but normal readings in the past. Blood pressure at today's visit within normal range. -Advise patient to continue monitoring blood pressure at home. -Plan to reassess blood pressure control at next visit.   Patient Instructions  Medication Instructions:  Your physician recommends that you continue on your current medications as directed. Please refer to the Current Medication list given to you today.  *If you need a refill on your cardiac medications before your next appointment, please call your pharmacy*  Testing/Procedures: Your physician has requested that you have an echocardiogram- OB. Echocardiography is a painless test that uses sound waves to create images of your heart. It provides your doctor with information about the size and shape of your heart and how well your heart's chambers and valves are working. This procedure takes approximately one hour. There are no restrictions for this procedure. Please do NOT wear cologne, perfume, aftershave, or lotions (deodorant is allowed). Please arrive 15 minutes prior to your appointment time.  Please note: We ask at that you  not bring children with you during ultrasound (echo/ vascular) testing. Due to room size and safety concerns, children are not allowed in the ultrasound rooms during exams. Our front office staff cannot provide observation of children in our lobby area while testing is being conducted. An adult accompanying a patient to their appointment will only be allowed in the ultrasound room at the discretion of the ultrasound technician under special circumstances. We apologize for any inconvenience.  ZIO XT- Long Term Monitor Instructions  Your physician has requested you wear a ZIO patch monitor for 14 days.  This is a single patch monitor. [employer] supplies one patch monitor per enrollment. Additional stickers are not available. Please do not apply patch if you will be having a Nuclear Stress Test,  Echocardiogram, Cardiac CT, MRI, or Chest Xray during the period you would be wearing the  monitor. The patch cannot be worn during these tests. You cannot remove and re-apply the  ZIO XT patch monitor.  Your ZIO patch monitor will be mailed 3 day USPS to your address on file. It may take 3-5 days  to receive your monitor after you have been enrolled.  Once you have received your monitor, please review the enclosed instructions. Your monitor  has already been registered assigning a specific monitor serial # to you.  Billing and Patient Assistance Program Information  We have supplied [employer] with any of your insurance information on file for billing purposes. [employer] offers a sliding scale Patient Assistance Program for patients that do not have  insurance, or whose insurance does not completely cover the cost of the ZIO monitor.  You must apply for the Patient Assistance Program to qualify for this discounted rate.  To apply, please call [employer] at [phone], select option 4, select option 2, ask to apply for  Patient Assistance Program. Meredeth Ide will ask your household income, and how many people  are  in your household. They will quote your out-of-pocket cost  based on that information.  [employer] will also be able to set up a 50-month, interest-free payment plan if needed.  Applying the monitor   Shave hair from upper left chest.  Hold abrader disc by orange tab. Rub abrader in 40 strokes over the upper left chest as  indicated in your monitor instructions.  Clean area with 4 enclosed alcohol pads. Let dry.  Apply patch as indicated in monitor instructions. Patch will be placed under collarbone on left  side of chest with arrow pointing upward.  Rub patch adhesive wings for 2 minutes. Remove white label marked "1". Remove the white  label marked "2". Rub patch adhesive wings for 2 additional minutes.  While looking in a mirror, press and release button in center of patch. A small green light will  flash 3-4 times. This will be your only indicator that the monitor has been turned on.  Do not shower for the first 24 hours. You may shower after the first 24 hours.  Press the button if you feel a symptom. You will hear a small click. Record Date, Time and  Symptom in the Patient Logbook.  When you are ready to remove the patch, follow instructions on the last 2 pages of Patient  Logbook. Stick patch monitor onto the last page of Patient Logbook.  Place Patient Logbook in the blue and white box. Use locking tab on box and tape box closed  securely. The blue and white box has prepaid postage on it. Please place it in the mailbox as  soon as possible. Your physician should have your test results approximately 7 days after the  monitor has been mailed back to [facility].  Call [facility] Customer Care at [phone] if you have questions regarding  your ZIO XT patch monitor. Call them immediately if you see an orange light blinking on your  monitor.  If your monitor falls off in less than 4 days, contact our Monitor department at [phone].  If your monitor becomes loose or falls  off after 4 days call [employer] at [phone] for  suggestions on securing your monitor    Follow-Up: At [facility], you and your health needs are our priority.  As part of our continuing mission to provide you with exceptional heart care, we have created designated Provider Care Teams.  These Care Teams include your primary Cardiologist (physician) and Advanced Practice Providers (APPs -  Physician Assistants and Nurse Practitioners) who all work together to provide you with the care you need, when you need it.   Your next appointment:   6 week(s)  Provider:   Thomasene Ripple, DO   Other Instructions     Dispo:  No follow-ups on file.   Medication Adjustments/Labs and Tests Ordered: Current medicines are reviewed at length with the patient today.  Concerns regarding medicines are outlined above.  Tests Ordered: Orders Placed This Encounter  Procedures   LONG TERM MONITOR (3-14 DAYS)   ECHOCARDIOGRAM COMPLETE   Medication Changes: No orders of the defined types were placed in this encounter.

## 2023-10-18 ENCOUNTER — Ambulatory Visit: Payer: BC Managed Care – PPO | Admitting: Cardiology

## 2023-10-18 DIAGNOSIS — M5459 Other low back pain: Secondary | ICD-10-CM | POA: Diagnosis not present

## 2023-10-18 DIAGNOSIS — M5093 Cervical disc disorder, unspecified, cervicothoracic region: Secondary | ICD-10-CM | POA: Diagnosis not present

## 2023-10-18 DIAGNOSIS — R6884 Jaw pain: Secondary | ICD-10-CM | POA: Diagnosis not present

## 2023-10-18 DIAGNOSIS — G4486 Cervicogenic headache: Secondary | ICD-10-CM | POA: Diagnosis not present

## 2023-10-19 ENCOUNTER — Ambulatory Visit (HOSPITAL_COMMUNITY): Payer: BC Managed Care – PPO | Attending: Cardiology

## 2023-10-19 ENCOUNTER — Encounter: Payer: Self-pay | Admitting: Cardiology

## 2023-10-19 DIAGNOSIS — R0609 Other forms of dyspnea: Secondary | ICD-10-CM | POA: Insufficient documentation

## 2023-10-19 LAB — ECHOCARDIOGRAM COMPLETE
Area-P 1/2: 3.63 cm2
S' Lateral: 2.8 cm

## 2023-10-20 DIAGNOSIS — R6884 Jaw pain: Secondary | ICD-10-CM | POA: Diagnosis not present

## 2023-10-20 DIAGNOSIS — M5459 Other low back pain: Secondary | ICD-10-CM | POA: Diagnosis not present

## 2023-10-20 DIAGNOSIS — M5093 Cervical disc disorder, unspecified, cervicothoracic region: Secondary | ICD-10-CM | POA: Diagnosis not present

## 2023-10-20 DIAGNOSIS — G4486 Cervicogenic headache: Secondary | ICD-10-CM | POA: Diagnosis not present

## 2023-10-22 ENCOUNTER — Ambulatory Visit: Payer: BC Managed Care – PPO | Attending: Obstetrics and Gynecology

## 2023-10-22 ENCOUNTER — Ambulatory Visit: Payer: BC Managed Care – PPO | Attending: Maternal & Fetal Medicine | Admitting: Maternal & Fetal Medicine

## 2023-10-22 ENCOUNTER — Other Ambulatory Visit: Payer: Self-pay | Admitting: *Deleted

## 2023-10-22 DIAGNOSIS — O10912 Unspecified pre-existing hypertension complicating pregnancy, second trimester: Secondary | ICD-10-CM

## 2023-10-22 DIAGNOSIS — Z3A23 23 weeks gestation of pregnancy: Secondary | ICD-10-CM

## 2023-10-22 DIAGNOSIS — O30041 Twin pregnancy, dichorionic/diamniotic, first trimester: Secondary | ICD-10-CM

## 2023-10-22 DIAGNOSIS — O30042 Twin pregnancy, dichorionic/diamniotic, second trimester: Secondary | ICD-10-CM | POA: Insufficient documentation

## 2023-10-22 DIAGNOSIS — O402XX1 Polyhydramnios, second trimester, fetus 1: Secondary | ICD-10-CM

## 2023-10-22 DIAGNOSIS — O283 Abnormal ultrasonic finding on antenatal screening of mother: Secondary | ICD-10-CM | POA: Insufficient documentation

## 2023-10-22 DIAGNOSIS — O99212 Obesity complicating pregnancy, second trimester: Secondary | ICD-10-CM

## 2023-10-22 DIAGNOSIS — E669 Obesity, unspecified: Secondary | ICD-10-CM

## 2023-10-22 NOTE — Progress Notes (Signed)
 Patient information  Patient Name: Melissa Boyd  Patient MRN:   [id number]  Referring practice: MFM Referring Provider: [facility])  MFM CONSULT  Melissa Boyd is a 28 y.o. G1P0 at [redacted]w[redacted]d here for ultrasound and consultation. 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 She denies contractions, bleeding, or loss of fluid and reports good fetal movement.   RE dichorionic/diamniotic twins: We reviewed again the potential complications and management of this type of twin pregnancy.  Currently she is doing well without complication other than mild polyhydramnios.  We discussed the importance of completing her glucose screening in the next few weeks.  RE mild polyhydramnios: There is mild polyhydramnios of twin A at 8.03 cm.  Twin B has a maximum vertical pocket of 7.68 cm and therefore is at the upper limit of normal.  I discussed this ultrasound finding with the patient as well as his clinical significance.  I discussed the most likely causes idiopathic followed by gestational diabetes.  We discussed the importance of having her glucose testing done in the next few weeks.  I discussed the rare but possible chance that a structural or neurologic condition could be causing polyhydramnios but this is much less likely than the  aforementioned conditions.  Sonographic findings Di-di intrauterine pregnancy at 23w 2d  Presentation: A: Cephalic. Fetal cardiac activity:  A: Observed, B: Observed and appears normal. The fetal anatomy appears normal with the stomach and bladder visualized.   Fetal biometry (estimated fetal weight): A: 87, B: 53 percentile.  Placenta: A: Posterior.       MVP: A: 8.03, B: 7.68.     There are limitations of prenatal ultrasound such as the inability to detect certain abnormalities due to poor visualization. Various factors such as fetal position, gestational age and maternal body habitus may increase the difficulty in visualizing the fetal anatomy.    Recommendations -Growth ultrasounds every 4 to 6 weeks or sooner if indicated. -Antenatal testing to start around 32 weeks given the polyhydramnios and twin pregnancy. -Glucose tolerance test to be done in the next 1 to 2 weeks if possible to rule out gestational diabetes. -Delivery timing pending clinical course but likely around 37 to 38 weeks.  Review of Systems: A review of systems was performed and was negative except per HPI   Vitals and Physical Exam    10/15/2023    5:29 PM 10/15/2023    5:28 PM 10/15/2023    4:52 PM  Vitals with BMI  Systolic 118 122 857  Diastolic 70 68 80    Sitting comfortably on the sonogram table Nonlabored breathing Normal rate and rhythm Abdomen is nontender  Past pregnancies OB History  Gravida Para Term Preterm AB Living  1       SAB IAB Ectopic Multiple Live Births     1     #  Outcome Date GA Lbr Len/2nd Weight Sex Type Anes PTL Lv  1 Current             I spent 20 minutes reviewing the patients chart, including labs and images as well as counseling the patient about her medical conditions. Greater than 50% of the time was spent in direct face-to-face patient counseling.  Melissa Boyd  MFM, [facility]   10/22/2023  3:51 PM

## 2023-10-23 DIAGNOSIS — I471 Supraventricular tachycardia, unspecified: Secondary | ICD-10-CM

## 2023-10-25 ENCOUNTER — Encounter: Payer: BC Managed Care – PPO | Admitting: Obstetrics & Gynecology

## 2023-10-27 DIAGNOSIS — M5093 Cervical disc disorder, unspecified, cervicothoracic region: Secondary | ICD-10-CM | POA: Diagnosis not present

## 2023-10-27 DIAGNOSIS — R6884 Jaw pain: Secondary | ICD-10-CM | POA: Diagnosis not present

## 2023-10-27 DIAGNOSIS — G4486 Cervicogenic headache: Secondary | ICD-10-CM | POA: Diagnosis not present

## 2023-10-27 DIAGNOSIS — M5459 Other low back pain: Secondary | ICD-10-CM | POA: Diagnosis not present

## 2023-10-29 ENCOUNTER — Ambulatory Visit: Payer: BC Managed Care – PPO | Admitting: Obstetrics and Gynecology

## 2023-10-29 ENCOUNTER — Other Ambulatory Visit: Payer: Self-pay

## 2023-10-29 ENCOUNTER — Telehealth: Payer: BC Managed Care – PPO | Admitting: Adult Health

## 2023-10-29 VITALS — BP 120/73 | HR 98 | Wt 220.3 lb

## 2023-10-29 DIAGNOSIS — Z3A24 24 weeks gestation of pregnancy: Secondary | ICD-10-CM

## 2023-10-29 DIAGNOSIS — M5459 Other low back pain: Secondary | ICD-10-CM | POA: Diagnosis not present

## 2023-10-29 DIAGNOSIS — Z3A23 23 weeks gestation of pregnancy: Secondary | ICD-10-CM

## 2023-10-29 DIAGNOSIS — O402XX1 Polyhydramnios, second trimester, fetus 1: Secondary | ICD-10-CM

## 2023-10-29 DIAGNOSIS — O0992 Supervision of high risk pregnancy, unspecified, second trimester: Secondary | ICD-10-CM

## 2023-10-29 DIAGNOSIS — Z86718 Personal history of other venous thrombosis and embolism: Secondary | ICD-10-CM | POA: Diagnosis not present

## 2023-10-29 DIAGNOSIS — G43009 Migraine without aura, not intractable, without status migrainosus: Secondary | ICD-10-CM

## 2023-10-29 DIAGNOSIS — Z0289 Encounter for other administrative examinations: Secondary | ICD-10-CM

## 2023-10-29 DIAGNOSIS — O30041 Twin pregnancy, dichorionic/diamniotic, first trimester: Secondary | ICD-10-CM

## 2023-10-29 DIAGNOSIS — O30042 Twin pregnancy, dichorionic/diamniotic, second trimester: Secondary | ICD-10-CM | POA: Diagnosis not present

## 2023-10-29 DIAGNOSIS — G43109 Migraine with aura, not intractable, without status migrainosus: Secondary | ICD-10-CM

## 2023-10-29 DIAGNOSIS — M5093 Cervical disc disorder, unspecified, cervicothoracic region: Secondary | ICD-10-CM | POA: Diagnosis not present

## 2023-10-29 DIAGNOSIS — R002 Palpitations: Secondary | ICD-10-CM

## 2023-10-29 DIAGNOSIS — G4486 Cervicogenic headache: Secondary | ICD-10-CM | POA: Diagnosis not present

## 2023-10-29 DIAGNOSIS — Z8679 Personal history of other diseases of the circulatory system: Secondary | ICD-10-CM

## 2023-10-29 DIAGNOSIS — R6884 Jaw pain: Secondary | ICD-10-CM | POA: Diagnosis not present

## 2023-10-29 DIAGNOSIS — O0990 Supervision of high risk pregnancy, unspecified, unspecified trimester: Secondary | ICD-10-CM
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
## 2023-10-29 NOTE — Patient Instructions (Signed)

## 2023-10-29 NOTE — Progress Notes (Signed)
   PRENATAL VISIT NOTE  Subjective:  Melissa Boyd is a 28 y.o. G1P0 at [redacted]w[redacted]d being seen today for ongoing prenatal care.  She is currently monitored for the following issues for this high-risk pregnancy and has Migraines; History of deep vein thrombosis of lower extremity; Food allergy; Paroxysmal supraventricular tachycardia (HCC); Adverse reaction to food, subsequent encounter; Dichorionic diamniotic twin pregnancy in first trimester; Supervision of high risk pregnancy, antepartum; Genital herpes simplex type 2; Abnormal cervical Papanicolaou smear; Headache in pregnancy, antepartum, first trimester; Muscle spasm; Baby B with EICF; and Polyhydramnios, second trimester, fetus 1 on their problem list.  Patient reports  note to be excused from jury duty .  Contractions: Not present. Vag. Bleeding: None.  Movement: Present. Denies leaking of fluid.   The following portions of the patient's history were reviewed and updated as appropriate: allergies, current medications, past family history, past medical history, past social history, past surgical history and problem list.   Objective:   Vitals:   10/29/23 1131  BP: 120/73  Pulse: 98  Weight: 220 lb 4.8 oz (99.9 kg)    Fetal Status: Fetal Heart Rate (bpm): 153/142   Movement: Present     General:  Alert, oriented and cooperative. Patient is in no acute distress.  Skin: Skin is warm and dry. No rash noted.   Cardiovascular: Normal heart rate noted  Respiratory: Normal respiratory effort, no problems with respiration noted  Abdomen: Soft, gravid, appropriate for gestational age.  Pain/Pressure: Absent     Pelvic: Cervical exam deferred        Extremities: Normal range of motion.  Edema: Trace  Mental Status: Normal mood and affect. Normal behavior. Normal judgment and thought content.   Assessment and Plan:  Pregnancy: G1P0 at [redacted]w[redacted]d 1. Supervision of high risk pregnancy, antepartum (Primary) BP and FHR normal Doing well, feeling  regular movement    2. History of deep vein thrombosis of lower extremity Continue lovenox   3. Dichorionic diamniotic twin pregnancy in first trimester 4. Polyhydramnios in second trimester, antepartum complication, fetus 1 2/7 u/s EFW twin A 87%, MVP 8.03 EFW twin B 53% afi normal   Antenatal testing to start around 32 weeks  Growth u/s 3/14 Delivery timing around 37-38 wks  5. [redacted] weeks gestation of pregnancy Anticipatory guidance regarding gtt and labs next visit  Note provided for jury duty    6. Palpitations 7. History of paroxysmal supraventricular tachycardia Following w/ Dr Servando Salina, currently wearing heart monitor   Preterm labor symptoms and general obstetric precautions including but not limited to vaginal bleeding, contractions, leaking of fluid and fetal movement were reviewed in detail with the patient. Please refer to After Visit Summary for other counseling recommendations.   Return in [facility] and 2hr[facility]  Future Appointments  Date Time Provider Department Center  11/18/2023  3:20 PM Tobb, Kardie, DO [facility] None  11/26/2023  8:15 AM Christean Leaf [facility]  11/26/2023  8:50 AM [facility] LAB [facility]  11/26/2023 10:15 AM [facility] NURSE [facility]  11/26/2023 10:30 AM [facility] US1 [facility]  12/17/2023 12:30 PM [facility] None  12/17/2023  1:00 PM Johney Maine, MD [facility] None  12/24/2023 10:15 AM [facility] NURSE [facility]  12/24/2023 10:30 AM [facility] US1 [facility]  02/28/2024 10:00 AM Butch Penny, NP [facility] None    Albertine Grates, FNP

## 2023-11-01 DIAGNOSIS — R6884 Jaw pain: Secondary | ICD-10-CM | POA: Diagnosis not present

## 2023-11-01 DIAGNOSIS — M5459 Other low back pain: Secondary | ICD-10-CM | POA: Diagnosis not present

## 2023-11-01 DIAGNOSIS — Z3A16 16 weeks gestation of pregnancy: Secondary | ICD-10-CM | POA: Diagnosis not present

## 2023-11-05 DIAGNOSIS — M5459 Other low back pain: Secondary | ICD-10-CM | POA: Diagnosis not present

## 2023-11-05 DIAGNOSIS — R6884 Jaw pain: Secondary | ICD-10-CM | POA: Diagnosis not present

## 2023-11-05 DIAGNOSIS — M5093 Cervical disc disorder, unspecified, cervicothoracic region: Secondary | ICD-10-CM | POA: Diagnosis not present

## 2023-11-08 DIAGNOSIS — M5093 Cervical disc disorder, unspecified, cervicothoracic region: Secondary | ICD-10-CM | POA: Diagnosis not present

## 2023-11-08 DIAGNOSIS — R6884 Jaw pain: Secondary | ICD-10-CM | POA: Diagnosis not present

## 2023-11-08 DIAGNOSIS — M5459 Other low back pain: Secondary | ICD-10-CM | POA: Diagnosis not present

## 2023-11-08 DIAGNOSIS — G4486 Cervicogenic headache: Secondary | ICD-10-CM | POA: Diagnosis not present

## 2023-11-10 ENCOUNTER — Ambulatory Visit: Payer: BC Managed Care – PPO | Admitting: Cardiology

## 2023-11-11 DIAGNOSIS — I471 Supraventricular tachycardia, unspecified: Secondary | ICD-10-CM | POA: Diagnosis not present

## 2023-11-13 ENCOUNTER — Encounter: Payer: Self-pay | Admitting: Cardiology

## 2023-11-17 DIAGNOSIS — M5093 Cervical disc disorder, unspecified, cervicothoracic region: Secondary | ICD-10-CM | POA: Diagnosis not present

## 2023-11-17 DIAGNOSIS — M5459 Other low back pain: Secondary | ICD-10-CM | POA: Diagnosis not present

## 2023-11-18 ENCOUNTER — Ambulatory Visit: Payer: BC Managed Care – PPO | Attending: Cardiology | Admitting: Cardiology

## 2023-11-18 ENCOUNTER — Encounter: Payer: Self-pay | Admitting: Cardiology

## 2023-11-18 ENCOUNTER — Encounter: Payer: Self-pay | Admitting: Lactation Services

## 2023-11-18 VITALS — BP 110/72 | HR 91 | Ht 68.0 in | Wt 225.0 lb

## 2023-11-18 DIAGNOSIS — Z86718 Personal history of other venous thrombosis and embolism: Secondary | ICD-10-CM

## 2023-11-18 DIAGNOSIS — I471 Supraventricular tachycardia, unspecified: Secondary | ICD-10-CM | POA: Diagnosis not present

## 2023-11-18 DIAGNOSIS — O30002 Twin pregnancy, unspecified number of placenta and unspecified number of amniotic sacs, second trimester: Secondary | ICD-10-CM

## 2023-11-18 DIAGNOSIS — Z3A27 27 weeks gestation of pregnancy: Secondary | ICD-10-CM | POA: Diagnosis not present

## 2023-11-18 NOTE — Patient Instructions (Signed)
 Medication Instructions:  Your physician recommends that you continue on your current medications as directed. Please refer to the Current Medication list given to you today.  *If you need a refill on your cardiac medications before your next appointment, please call your pharmacy*   Follow-Up: At [facility], you and your health needs are our priority.  As part of our continuing mission to provide you with exceptional heart care, we have created designated Provider Care Teams.  These Care Teams include your primary Cardiologist (physician) and Advanced Practice Providers (APPs -  Physician Assistants and Nurse Practitioners) who all work together to provide you with the care you need, when you need it.   Your next appointment:   8-9 weeks   Provider:   Thomasene Ripple, DO

## 2023-11-19 DIAGNOSIS — Z0289 Encounter for other administrative examinations: Secondary | ICD-10-CM

## 2023-11-19 NOTE — Progress Notes (Signed)
 Cardio-Obstetrics Clinic  Follow up  Date:  11/19/2023   ID:  Melissa Boyd, DOB 1996/03/11, MRN [id number]  PCP:  Sigmund Hazel, MD   [facility] Providers Cardiologist:  None  Electrophysiologist:  None       Referring MD: Sigmund Hazel, MD   Chief Complaint: " I am having palpitations"  History of Present Illness:    Melissa Boyd is a 28 y.o. female [G1P0] who is being seen today for follow up due to hx of SVT.   At her last visit I ordered an echo and a zio monitor - both normal.  She reports that her heart rate frequently elevates to the 120s and has reached the 160s. These episodes occur often, possibly daily, and last for about ten minutes. She denies associated lightheadedness or dizziness. She has been monitoring her heart rate with a watch, which alerts her to these episodes.  The patient also reports leg pain, which she attributes to a history of blood clots. She is currently on anticoagulation therapy with Lovenox. She has noticed that one leg swells more than the other and experiences more pain, but denies any numbness or inability to walk.   Prior CV Studies Reviewed: The following studies were reviewed today: Echo and zio  Past Medical History:  Diagnosis Date   Allergy    Phreesia 04/29/2020   DVT (deep venous thrombosis) (HCC)    Headache, migraine    Ovarian cyst 05/23/2023   Subchorionic hematoma    Urticaria     Past Surgical History:  Procedure Laterality Date   SHOULDER SURGERY     left   WISDOM TOOTH EXTRACTION        OB History     Gravida  1   Para      Term      Preterm      AB      Living         SAB      IAB      Ectopic      Multiple  1   Live Births                  Current Medications: Current Meds  Medication Sig   ASPIRIN 81 PO Take 81 mg by mouth daily.   cyclobenzaprine (FLEXERIL) 10 MG tablet Take 1 tablet (10 mg total) by mouth 3 (three) times daily as needed for muscle spasms.    enoxaparin (LOVENOX) 60 MG/0.6ML injection Inject 0.6 mLs (60 mg total) into the skin daily.   EPINEPHrine (AUVI-Q) 0.3 mg/0.3 mL IJ SOAJ injection Use as directed for severe allergic reactions   metoCLOPramide (REGLAN) 10 MG tablet Take 1 tablet (10 mg total) by mouth 3 (three) times daily as needed for nausea.   Prenatal Vit-Fe Fumarate-FA (PRENATAL MULTIVITAMIN) TABS tablet Take 1 tablet by mouth daily at 12 noon.   SUMAtriptan (IMITREX) 20 MG/ACT nasal spray Place 1 spray (20 mg total) into the nose every 2 (two) hours as needed for migraine or headache. May repeat in 2 hours if headache persists or recurs.     Allergies:   Other, Shellfish allergy, Naproxen, Amoxicillin, and Doxycycline   Social History   Socioeconomic History   Marital status: Single    Spouse name: Not on file   Number of children: Not on file   Years of education: Not on file   Highest education level: Not on file  Occupational History   Not on file  Tobacco  Use   Smoking status: Never   Smokeless tobacco: Never  Vaping Use   Vaping status: Never Used  Substance and Sexual Activity   Alcohol use: Not Currently   Drug use: No   Sexual activity: Yes    Birth control/protection: None  Other Topics Concern   Not on file  Social History Narrative   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 She enjoys track and field.   Lives in [place].    Right handed   Caffeine: maybe once a week. Neita Carp)      Social Drivers of Health   Financial Resource Strain: Not on file  Food Insecurity: No Food Insecurity (07/31/2023)   Hunger Vital Sign    Worried About Running Out of Food in the Last Year: Never true    Ran Out of Food in the Last Year: Never true  Transportation Needs: No Transportation Needs (07/31/2023)   PRAPARE - Administrator, Civil Service (Medical): No    Lack of Transportation (Non-Medical): No  Physical  Activity: Not on file  Stress: Not on file  Social Connections: Not on file      Family History  Problem Relation Age of Onset   Anemia Father        low iron    Alzheimer's disease Paternal Grandfather    Migraines Brother    Hypertension Maternal Aunt    Migraines Cousin        Mother's 1st cousin; has them really bad    Migraines Cousin       ROS:   Please see the history of present illness.    Palpitatios All other systems reviewed and are negative.   Labs/EKG Reviewed:    EKG:   Sinus rhythm   Recent Labs: 10/12/2023: ALT 30; BUN 9; Creatinine 0.67; Hemoglobin 10.9; Magnesium 1.7; Platelet Count 198; Potassium 3.7; Sodium 134; TSH 1.717   Recent Lipid Panel No results found for: "CHOL", "TRIG", "HDL", "CHOLHDL", "LDLCALC", "LDLDIRECT"  Physical Exam:    VS:  BP 110/72 (BP Location: Right Arm, Patient Position: Sitting, Cuff Size: Normal)   Pulse 91   Ht 5\' 8"  (1.727 m)   Wt 225 lb (102.1 kg)   LMP 05/08/2023 (Exact Date) Comment: + pregnancy test 9/29, irreg cycles  SpO2 95%   BMI 34.21 kg/m     Wt Readings from Last 3 Encounters:  11/18/23 225 lb (102.1 kg)  10/29/23 220 lb 4.8 oz (99.9 kg)  10/15/23 219 lb 11.2 oz (99.7 kg)     GEN:  Well nourished, well developed in no acute distress HEENT: Normal NECK: No JVD; No carotid bruits LYMPHATICS: No lymphadenopathy CARDIAC: RRR, no murmurs, rubs, gallops RESPIRATORY:  Clear to auscultation without rales, wheezing or rhonchi  ABDOMEN: Soft, non-tender, non-distended MUSCULOSKELETAL:  No edema; No deformity  SKIN: Warm and dry NEUROLOGIC:  Alert and oriented x 3 PSYCHIATRIC:  Normal affect    Risk Assessment/Risk Calculators:                  ASSESSMENT & PLAN:    Supraventricular Tachycardia (SVT) Recurrent episodes of palpitations, shortness of breath, and feeling of suffocation. History of SVT in 2015 with heart rate. Zio monitor with no evidence of svt.   Frequent palpitations with  heart rates up to 160 bpm, typically in the 130s to 140s, without dizziness or lightheadedness. Twin pregnancy contributes to elevated heart rates. Previous monitoring  showed no arrhythmias. Maintaining adequate cardiac output for twins is crucial, so shared decision will hold off  medication to lower heart rate unless symptoms become intolerable. - Encourage hydration and reduction of caffeine and sugar intake. - Advise sitting down during episodes of elevated heart rate. - Offer to provide a work note if needed.   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 Common in twin pregnancies and should resolve post-delivery.   [redacted] weeks gestation  Currently on Lovenox for history of blood clots. No current complications noted. -Continue current management. -Advise patient that baby receives oxygen supply first during episodes of perceived shortness of breath.  Hypertension Patient reports recent increase in blood pressure readings at home, but normal readings in the past. Blood pressure at today's visit within normal range. -Advise patient to continue monitoring blood pressure at home. -Plan to reassess blood pressure control at next visit.  Deep Vein Thrombosis (DVT) On anticoagulation therapy with Lovenox for DVT. Reports leg pain and swelling, particularly in one leg, consistent with history. No recent ultrasounds performed during this pregnancy, but protected by anticoagulation therapy. - Continue Lovenox therapy. - Monitor for increased pain or inability to walk. - Consider ultrasound if symptoms worsen.  Follow-up Advised to have a follow-up visit before delivery. Options for virtual or in-person visits provided based on condition and preference. - Schedule a follow-up visit in one week, around 35-36 weeks of gestation. - Offer virtual visit with the option to convert to  in-person if needed.   Patient Instructions  Medication Instructions:  Your physician recommends that you continue on your current medications as directed. Please refer to the Current Medication list given to you today.  *If you need a refill on your cardiac medications before your next appointment, please call your pharmacy*   Follow-Up: At [facility], you and your health needs are our priority.  As part of our continuing mission to provide you with exceptional heart care, we have created designated Provider Care Teams.  These Care Teams include your primary Cardiologist (physician) and Advanced Practice Providers (APPs -  Physician Assistants and Nurse Practitioners) who all work together to provide you with the care you need, when you need it.   Your next appointment:   8-9 weeks   Provider:   Thomasene Ripple, DO    Dispo:  No follow-ups on file.   Medication Adjustments/Labs and Tests Ordered: Current medicines are reviewed at length with the patient today.  Concerns regarding medicines are outlined above.  Tests Ordered: Orders Placed This Encounter  Procedures   EKG 12-Lead   Medication Changes: No orders of the defined types were placed in this encounter.

## 2023-11-24 DIAGNOSIS — G4486 Cervicogenic headache: Secondary | ICD-10-CM | POA: Diagnosis not present

## 2023-11-24 DIAGNOSIS — M5459 Other low back pain: Secondary | ICD-10-CM | POA: Diagnosis not present

## 2023-11-24 DIAGNOSIS — M5093 Cervical disc disorder, unspecified, cervicothoracic region: Secondary | ICD-10-CM | POA: Diagnosis not present

## 2023-11-24 DIAGNOSIS — R6884 Jaw pain: Secondary | ICD-10-CM | POA: Diagnosis not present

## 2023-11-25 ENCOUNTER — Other Ambulatory Visit: Payer: Self-pay

## 2023-11-25 DIAGNOSIS — Z3A28 28 weeks gestation of pregnancy: Secondary | ICD-10-CM

## 2023-11-25 NOTE — Progress Notes (Unsigned)
   PRENATAL VISIT NOTE  Subjective:  Melissa Boyd is a 28 y.o. G1P0 at [redacted]w[redacted]d being seen today for ongoing prenatal care.  She is currently monitored for the following issues for this {Blank single:19197::"high-risk","low-risk"} pregnancy and has Migraines; History of deep vein thrombosis of lower extremity; Food allergy; Paroxysmal supraventricular tachycardia (HCC); Adverse reaction to food, subsequent encounter; Dichorionic diamniotic twin pregnancy in first trimester; Supervision of high risk pregnancy, antepartum; Genital herpes simplex type 2; Abnormal cervical Papanicolaou smear; Headache in pregnancy, antepartum, first trimester; Muscle spasm; Baby B with EICF; and Polyhydramnios, second trimester, fetus 1 on their problem list.  Patient reports {sx:14538}.   .  .   . Denies leaking of fluid.   The following portions of the patient's history were reviewed and updated as appropriate: allergies, current medications, past family history, past medical history, past social history, past surgical history and problem list.   Objective:  There were no vitals filed for this visit.  Fetal Status:           General:  Alert, oriented and cooperative. Patient is in no acute distress.  Skin: Skin is warm and dry. No rash noted.   Cardiovascular: Normal heart rate noted  Respiratory: Normal respiratory effort, no problems with respiration noted  Abdomen: Soft, gravid, appropriate for gestational age.        Pelvic: Cervical exam deferred        Extremities: Normal range of motion.     Mental Status: Normal mood and affect. Normal behavior. Normal judgment and thought content.   Assessment and Plan:  Pregnancy: G1P0 at [redacted]w[redacted]d 1. Supervision of high risk pregnancy, antepartum (Primary) Patient doing well, feeling regular fetal movement BP, FHR, FH appropriate   2. [redacted] weeks gestation of pregnancy Anticipatory guidance about next visits/weeks of pregnancy given.   3. Dichorionic diamniotic twin  pregnancy in third trimester 4. Polyhydramnios in third trimester complication, fetus 1 of multiple gestation Growth scans every 4 to 6 weeks  Antenatal testing starting at 32 weeks  Delivery likely around 37 to 38 weeks, per MFM  Preterm labor symptoms and general obstetric precautions including but not limited to vaginal bleeding, contractions, leaking of fluid and fetal movement were reviewed in detail with the patient.  Please refer to After Visit Summary for other counseling recommendations.   No follow-ups on file.  Future Appointments  Date Time Provider Department Center  11/26/2023  8:15 AM Christean Leaf [facility]  11/26/2023  8:50 AM [facility]  11/26/2023 10:15 AM [facility] NURSE [facility]  11/26/2023 10:30 AM [facility] US1 [facility]  12/09/2023  3:15 PM Adam Phenix, MD [facility]  12/17/2023 12:30 PM [facility] LAB [facility] None  12/17/2023  1:00 PM Johney Maine, MD [facility] None  12/23/2023  2:35 PM Mora Appl [facility]  12/24/2023 10:15 AM [facility] NURSE [facility]  12/24/2023 10:30 AM [facility] US1 [facility]  01/21/2024  9:40 AM Tobb, Lavona Mound, DO [facility] None  02/28/2024 10:00 AM Butch Penny, NP [facility] None    Ralene Muskrat, PA-C

## 2023-11-26 ENCOUNTER — Ambulatory Visit: Payer: BC Managed Care – PPO

## 2023-11-26 ENCOUNTER — Other Ambulatory Visit: Payer: Self-pay

## 2023-11-26 ENCOUNTER — Ambulatory Visit: Payer: BC Managed Care – PPO | Admitting: Physician Assistant

## 2023-11-26 ENCOUNTER — Other Ambulatory Visit: Payer: BC Managed Care – PPO

## 2023-11-26 ENCOUNTER — Other Ambulatory Visit
Admission: RE | Admit: 2023-11-26 | Discharge: 2023-11-26 | Disposition: A | Discharge status: Home / Self Care | Source: Ambulatory Visit | Attending: Physician Assistant | Admitting: Physician Assistant

## 2023-11-26 ENCOUNTER — Ambulatory Visit: Payer: BC Managed Care – PPO | Admitting: *Deleted

## 2023-11-26 VITALS — BP 109/63 | HR 105

## 2023-11-26 VITALS — BP 112/76 | HR 94 | Wt 220.0 lb

## 2023-11-26 DIAGNOSIS — O30042 Twin pregnancy, dichorionic/diamniotic, second trimester: Secondary | ICD-10-CM | POA: Diagnosis not present

## 2023-11-26 DIAGNOSIS — O0993 Supervision of high risk pregnancy, unspecified, third trimester: Secondary | ICD-10-CM

## 2023-11-26 DIAGNOSIS — O30043 Twin pregnancy, dichorionic/diamniotic, third trimester: Secondary | ICD-10-CM

## 2023-11-26 DIAGNOSIS — Z3A28 28 weeks gestation of pregnancy: Secondary | ICD-10-CM

## 2023-11-26 DIAGNOSIS — O403XX1 Polyhydramnios, third trimester, fetus 1: Secondary | ICD-10-CM

## 2023-11-26 DIAGNOSIS — E669 Obesity, unspecified: Secondary | ICD-10-CM

## 2023-11-26 DIAGNOSIS — N898 Other specified noninflammatory disorders of vagina: Secondary | ICD-10-CM

## 2023-11-26 DIAGNOSIS — Z23 Encounter for immunization: Secondary | ICD-10-CM | POA: Diagnosis not present

## 2023-11-26 DIAGNOSIS — O10912 Unspecified pre-existing hypertension complicating pregnancy, second trimester: Secondary | ICD-10-CM | POA: Diagnosis not present

## 2023-11-26 DIAGNOSIS — L309 Dermatitis, unspecified: Secondary | ICD-10-CM

## 2023-11-26 DIAGNOSIS — Z3493 Encounter for supervision of normal pregnancy, unspecified, third trimester: Secondary | ICD-10-CM | POA: Diagnosis not present

## 2023-11-26 DIAGNOSIS — O0990 Supervision of high risk pregnancy, unspecified, unspecified trimester: Secondary | ICD-10-CM | POA: Diagnosis not present

## 2023-11-26 DIAGNOSIS — O99213 Obesity complicating pregnancy, third trimester: Secondary | ICD-10-CM | POA: Diagnosis not present

## 2023-11-26 DIAGNOSIS — O2233 Deep phlebothrombosis in pregnancy, third trimester: Secondary | ICD-10-CM

## 2023-11-26 MED ORDER — HYDROCORTISONE 2.5 % EX CREA: TOPICAL_CREAM | Freq: Two times a day (BID) | CUTANEOUS | 0 refills | Status: AC

## 2023-11-26 NOTE — Patient Instructions (Signed)
 We highly recommend childbirth education to help you plan for labor and begin practicing coping skills (which will be needed with or without pain meds).  [place] Childbirth Education Options: Sign up by visiting [email or web address]  Childbirth ~ Self-Paced eClass (English and Spanish) This online class offers you the freedom to complete a childbirth education series in the comfort of your own home at your own pace.  Childbirth Class (In-Person 4-Week Series  or on Saturdays, Virtual 4-Week Series ~ [place]) This interactive in-person class series will help you and your partner prepare for your birth experience. Topics include: Labor & Birth, Comfort Measures, Breathing Techniques, Massage, Medical Interventions, Pain Management Options, Cesarean Birth, Postpartum Care, and Newborn Care  Comfort Techniques for Labor ~ In-Person Class [facility]) This interactive class is designed for parents-to-be who want to learn & practice hands-on skills to help relieve some of the discomfort of labor and encourage their babies to rotate toward the best position for birth. Moms and their partners will be able to try a variety of labor positions with birth balls and rebozos as well as practice breathing, relaxation, and visualization techniques.  Natural Childbirth Class (In-Person 5-Week Series, In-Person on Saturdays or Virtual 5-Week Series ~ [place]) This class series is designed for expectant parents who want to learn and practice natural methods of coping with the process of labor and childbirth.  Cesarean Birth Self-Paced eClass (English and Spanish) This online course provides comprehensive information you can trust as you prepare for a possible cesarean birth. In this class, you'll learn how to make your birth and recovery comfortable and joyful through instructive video clips, animations, and activities.  Waterbirth ~ Airline pilot Interested in a waterbirth? In addition to a consultation  with your credentialed waterbirth provider, this free, informational online class will help you discover whether waterbirth is the right fit for you. Not all obstetrical practices offer waterbirth, so check with your healthcare provider.  Tour Probation officer) - [facility] [employer] our 4 minute video tour of [facility] located in [place].    Parenting Education Options:  Pregnancy 101 (Virtual) Congratulations on your pregnancy! This class is geared toward moms in their first trimester, but everyone is welcome. We are excited to guide you through all aspects of supporting a healthy pregnancy. You will learn what to expect at routine prenatal care appointments, common postpartum adjustments, basic infant safety, and breastfeeding.  Successful Partnering & Parenting ~ In-Person Workshop [facility]) This workshop inspires and equips partners of all economic levels, ages, and cultures to confidently care for their infants, support the birthing persons, and navigate their own transformations into new partners and parents. Learning activities are geared towards supporting partner, but moms are welcome to attend.  'Baby & Me' Parenting Group (Virtual on Wednesdays at 11am) Enjoy this time discussing newborn & infant parenting topics and family adjustment issues with other new parents in a relaxed environment. Each week brings a new speaker or baby-centered activity. This group offers support and connection to parents as they journey through the adjustments and struggles of that sometimes overwhelming first year after the birth of a child.  Baby Safety, CPR, & Choking Class ~ Virtual This life-saving information is meant to encourage parents as they learn important safety and prevention tips as well as infant CPR and relief of choking.  Breastfeeding Class (In-Person in [place] or [employer]) Families learn what to expect in the  first days and weeks of breastfeeding your  newborn. IF YOU ARE AN EMPLOYEE TAKING THIS CLASS FOR CREDIT, DO NOT register yourself. Please e-mail [email or web address].   Breastfeeding Self-Paced eClass (English & Spanish) Families learn what to expect in the first days and weeks of breastfeeding your newborn.  Caring for Baby ~ In-Person, Virtual or Self-Paced Class This in-person class is for both expectant and adoptive parents who want to learn and practice the most up-to-date newborn care for their babies. Focus is on birth through the first six weeks of life.  CPR & Choking Relief for Infants & Children ~ In-Person Class [facility]) This in-person course is designed for any parent, expectant parent, or adult who cares for infants or children. Participants learn and demonstrate cardiopulmonary resuscitation and choking relief procedures for both infants and children.  Grandparent Love ~ In-Person Class Grandparents will learn the most updated infant care and safety recommendations. They will discover ways to support their own children during the transition into the parenting role and receive tips on communicating with the new parents.  [place] Parenting Support Group Options:  Bereavement Grief Support Group (Pregnancy/Infant Loss) - Virtual This is an ongoing experience that meets once a month and is designed to help you honor the past, assist you in discovering tools to strengthen you today, and aid you in developing hope for the future.  Breastfeeding & Pumping Support Group (In-Person on Thursdays at 12pm or Virtual on Tuesdays at 5pm) Join [place] in-person each Thursday starting June 1st, 2023 at 12pm! This support group is free for all families looking for breastfeeding and/or pumping support.   Community-Based Childbirth Education Options:  [facility] Department Classes:  Childbirth education classes can help you get ready for a positive parenting experience. You  can also meet other expectant parents and get free stuff for your baby. Each class runs for five weeks on the same night and costs $45 for the mother-to-be and her support person. Medicaid covers the cost if you are eligible. Call [phone] to register.  [facility] [employer] offers a variety of programs for the [employer] and is another great way to get connected. Please go to [email or web address] for more information.  Childbirth With A Twist! Be informed of your options, get educated on birth, understand what your body is doing, learn how to cope, and have a lot of fun and laughs all while doing it either from the comfort of your couch OR in our cozy office and classroom space near the [place] airport. If you are taking a virtual class, then class is taught LIVE, so you can ask questions and receive answers in real-time from an experienced doula and childbirth educator.  This virtual childbirth education class will meet for five instruction times online.  Although we are based in [place], [place], this virtual class is open to anyone in the world. Please visit: [email or web address] for more information.  Books We Love: The Doula Guide to Childbirth by Harland German and Otila Back The First-Time Parent's Childbirth Handbook by Dr. Amie Critchley, CNM The Birth Partner by Truddie Crumble   Childbirth Education Options: [facility] Department Classes:  Childbirth education classes can help you get ready for a positive parenting experience. You can also meet other expectant parents and get free stuff for your baby. Each class runs for five weeks on the same night and costs $45 for the mother-to-be and her support person. Medicaid covers the cost if you are eligible. Call [phone] to register. [facility] Childbirth Education: Classes  can vary in availability and schedule is subject to change. For most up-to-date  information please visit [email or web address] to review and register.

## 2023-11-27 ENCOUNTER — Encounter: Payer: Self-pay | Admitting: Physician Assistant

## 2023-11-27 LAB — CBC
Hematocrit: 35.4 % (ref 34.0–46.6)
Hemoglobin: 12.3 g/dL (ref 11.1–15.9)
MCH: 34.7 pg — ABNORMAL HIGH (ref 26.6–33.0)
MCHC: 34.7 g/dL (ref 31.5–35.7)
MCV: 100 fL — ABNORMAL HIGH (ref 79–97)
Platelets: 178 10*3/uL (ref 150–450)
RBC: 3.54 x10E6/uL — ABNORMAL LOW (ref 3.77–5.28)
RDW: 11.9 % (ref 11.7–15.4)
WBC: 5.6 10*3/uL (ref 3.4–10.8)

## 2023-11-27 LAB — RPR: RPR Ser Ql: NONREACTIVE

## 2023-11-27 LAB — GLUCOSE TOLERANCE, 2 HOURS W/ 1HR
Glucose, 1 hour: 156 mg/dL (ref 70–179)
Glucose, 2 hour: 121 mg/dL (ref 70–152)
Glucose, Fasting: 71 mg/dL (ref 70–91)

## 2023-11-27 LAB — HIV ANTIBODY (ROUTINE TESTING W REFLEX): HIV Screen 4th Generation wRfx: NONREACTIVE

## 2023-11-29 DIAGNOSIS — M5093 Cervical disc disorder, unspecified, cervicothoracic region: Secondary | ICD-10-CM | POA: Diagnosis not present

## 2023-11-29 DIAGNOSIS — M5459 Other low back pain: Secondary | ICD-10-CM | POA: Diagnosis not present

## 2023-11-29 DIAGNOSIS — R6884 Jaw pain: Secondary | ICD-10-CM | POA: Diagnosis not present

## 2023-11-29 DIAGNOSIS — G4486 Cervicogenic headache: Secondary | ICD-10-CM | POA: Diagnosis not present

## 2023-11-29 LAB — CERVICOVAGINAL ANCILLARY ONLY
Bacterial Vaginitis (gardnerella): NEGATIVE
Candida Glabrata: NEGATIVE
Candida Vaginitis: NEGATIVE
Chlamydia: NEGATIVE
Comment: NEGATIVE
Comment: NEGATIVE
Comment: NEGATIVE
Comment: NEGATIVE
Comment: NEGATIVE
Comment: NORMAL
Neisseria Gonorrhea: NEGATIVE
Trichomonas: NEGATIVE

## 2023-12-01 DIAGNOSIS — G4486 Cervicogenic headache: Secondary | ICD-10-CM | POA: Diagnosis not present

## 2023-12-01 DIAGNOSIS — R6884 Jaw pain: Secondary | ICD-10-CM | POA: Diagnosis not present

## 2023-12-01 DIAGNOSIS — M5459 Other low back pain: Secondary | ICD-10-CM | POA: Diagnosis not present

## 2023-12-01 DIAGNOSIS — M5093 Cervical disc disorder, unspecified, cervicothoracic region: Secondary | ICD-10-CM | POA: Diagnosis not present

## 2023-12-08 DIAGNOSIS — M5459 Other low back pain: Secondary | ICD-10-CM | POA: Diagnosis not present

## 2023-12-08 DIAGNOSIS — M5093 Cervical disc disorder, unspecified, cervicothoracic region: Secondary | ICD-10-CM | POA: Diagnosis not present

## 2023-12-08 DIAGNOSIS — R6884 Jaw pain: Secondary | ICD-10-CM | POA: Diagnosis not present

## 2023-12-08 DIAGNOSIS — G4486 Cervicogenic headache: Secondary | ICD-10-CM | POA: Diagnosis not present

## 2023-12-09 ENCOUNTER — Other Ambulatory Visit: Payer: Self-pay

## 2023-12-09 ENCOUNTER — Ambulatory Visit: Admitting: Obstetrics & Gynecology

## 2023-12-09 VITALS — BP 113/73 | HR 80 | Wt 224.0 lb

## 2023-12-09 DIAGNOSIS — O30041 Twin pregnancy, dichorionic/diamniotic, first trimester: Secondary | ICD-10-CM

## 2023-12-09 DIAGNOSIS — Z86718 Personal history of other venous thrombosis and embolism: Secondary | ICD-10-CM

## 2023-12-09 DIAGNOSIS — O30043 Twin pregnancy, dichorionic/diamniotic, third trimester: Secondary | ICD-10-CM

## 2023-12-09 DIAGNOSIS — O0993 Supervision of high risk pregnancy, unspecified, third trimester: Secondary | ICD-10-CM | POA: Diagnosis not present

## 2023-12-09 DIAGNOSIS — O0990 Supervision of high risk pregnancy, unspecified, unspecified trimester: Secondary | ICD-10-CM | POA: Diagnosis not present

## 2023-12-09 DIAGNOSIS — Z3A30 30 weeks gestation of pregnancy: Secondary | ICD-10-CM | POA: Diagnosis not present

## 2023-12-09 LAB — POCT URINALYSIS DIP (DEVICE)
Bilirubin Urine: NEGATIVE
Glucose, UA: NEGATIVE mg/dL
Ketones, ur: NEGATIVE mg/dL
Leukocytes,Ua: NEGATIVE
Nitrite: NEGATIVE
Protein, ur: 30 mg/dL — AB
Specific Gravity, Urine: >=1.03 (ref 1.005–1.030)
Urobilinogen, UA: 0.2 mg/dL (ref 0.0–1.0)
pH: 6 (ref 5.0–8.0)

## 2023-12-09 MED ORDER — PANTOPRAZOLE SODIUM 40 MG PO TBEC: 40.0000 mg | DELAYED_RELEASE_TABLET | Freq: Every day | ORAL | 2 refills | Status: DC

## 2023-12-09 NOTE — Progress Notes (Addendum)
   PRENATAL VISIT NOTE  Subjective:  Melissa Boyd is a 28 y.o. G1P0 at [redacted]w[redacted]d being seen today for ongoing prenatal care.  She is currently monitored for the following issues for this high-risk pregnancy and has Migraines; History of deep vein thrombosis of lower extremity; Food allergy; Paroxysmal supraventricular tachycardia (HCC); Adverse reaction to food, subsequent encounter; Dichorionic diamniotic twin pregnancy in first trimester; Supervision of high risk pregnancy, antepartum; Genital herpes simplex type 2; Abnormal cervical Papanicolaou smear; Headache in pregnancy, antepartum, first trimester; Muscle spasm; Baby B with EICF; and Polyhydramnios, second trimester, fetus 1 on their problem list.  Patient reports heartburn and nausea.  Contractions: Irritability. Vag. Bleeding: Other (brown).  Movement: Present. Denies leaking of fluid.   The following portions of the patient's history were reviewed and updated as appropriate: allergies, current medications, past family history, past medical history, past social history, past surgical history and problem list.   Objective:   Vitals:   12/09/23 1554  BP: 113/73  Pulse: 80  Weight: 224 lb (101.6 kg)    Fetal Status: Fetal Heart Rate (bpm): 145/154   Movement: Present     General:  Alert, oriented and cooperative. Patient is in no acute distress.  Skin: Skin is warm and dry. No rash noted.   Cardiovascular: Normal heart rate noted  Respiratory: Normal respiratory effort, no problems with respiration noted  Abdomen: Soft, gravid, appropriate for gestational age.  Pain/Pressure: Present     Pelvic: Cervical exam was not performed at this visit.       Extremities: Normal range of motion.     Mental Status: Normal mood and affect. Normal behavior. Normal judgment and thought content.   Assessment and Plan:  Pregnancy: G1P0 at [redacted]w[redacted]d 1. Dichorionic diamniotic twin pregnancy in first trimester (Primary) - Continue routine monitoring for  d-di twin pregnancy with regular antenatal visits and serial growth measurements as indicated  2. Supervision of high risk pregnancy, antepartum - Continue routine prenatal care - Culture, OB Urine - pantoprazole (PROTONIX) 40 MG tablet; Take 1 tablet (40 mg total) by mouth daily.  Dispense: 30 tablet; Refill: 2  3. History of deep vein thrombosis of lower extremity - Continue to monitor for signs of thromboembolism  Preterm labor symptoms and general obstetric precautions including but not limited to vaginal bleeding, contractions, leaking of fluid and fetal movement were reviewed in detail with the patient. Please refer to After Visit Summary for other counseling recommendations.   Return in about 2 weeks (around 12/23/2023).  Future Appointments  Date Time Provider Department Center  12/17/2023 12:30 PM [facility] LAB [facility] None  12/17/2023  1:00 PM Johney Maine, MD [facility] None  12/23/2023  2:35 PM Mora Appl [facility]  12/24/2023 10:15 AM [facility] NURSE [facility]  12/24/2023 10:30 AM [facility] US1 [facility]  01/21/2024  9:40 AM Tobb, Lavona Mound, DO [facility] None  02/28/2024 10:00 AM Butch Penny, NP [facility] None    Kerrin Mo, Medical Student Attestation of Attending Supervision of Medical Student: Evaluation and management procedures were performed by the medical student under my supervision and collaboration.  I have reviewed the student's note and chart, and I agree with the management and plan.  Scheryl Darter, MD, FACOG Attending Obstetrician & Gynecologist Faculty Practice, [facility] [address] [address]

## 2023-12-11 LAB — URINE CULTURE, OB REFLEX

## 2023-12-11 LAB — CULTURE, OB URINE

## 2023-12-13 DIAGNOSIS — R6884 Jaw pain: Secondary | ICD-10-CM | POA: Diagnosis not present

## 2023-12-13 DIAGNOSIS — M5459 Other low back pain: Secondary | ICD-10-CM | POA: Diagnosis not present

## 2023-12-13 DIAGNOSIS — M5093 Cervical disc disorder, unspecified, cervicothoracic region: Secondary | ICD-10-CM | POA: Diagnosis not present

## 2023-12-13 DIAGNOSIS — G4486 Cervicogenic headache: Secondary | ICD-10-CM | POA: Diagnosis not present

## 2023-12-16 ENCOUNTER — Other Ambulatory Visit: Payer: Self-pay

## 2023-12-16 DIAGNOSIS — Z86718 Personal history of other venous thrombosis and embolism: Secondary | ICD-10-CM

## 2023-12-16 DIAGNOSIS — Z832 Family history of diseases of the blood and blood-forming organs and certain disorders involving the immune mechanism: Secondary | ICD-10-CM

## 2023-12-16 NOTE — Progress Notes (Signed)
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 iron polysaccharide is generally tolerated better than ferrous sulfate -continue B12 SL daily -continue Iron polysaccharide 150mg  po daily -continue B12 and iron supplements even after delivery for at least 6 months until breastfeeding  -recommend staying well-hydrated -recommend sleeping on the left side to take pressure off the vein -continue to follow with OB/GYN for frequent labs -patient shall return to clinic as needed -answered all of patient's questions in detail  FOLLOW-UP: RTC with Obgyn and PCP RTC with Dr Candise Che as needed  The total time spent in the appointment was  *** minutes* .  All of the patient's questions were answered with apparent satisfaction. The patient knows to call the clinic with any problems, questions or concerns.   Wyvonnia Lora MD MS AAHIVMS [facility] Hematology/Oncology Physician [facility]  .*Total Encounter Time as defined by the  Centers for Medicare and Medicaid Services includes, in addition to the face-to-face time of a patient visit (documented in the note above) non-face-to-face time: obtaining and reviewing outside history, ordering and reviewing medications, tests or procedures, care coordination (communications with other health care professionals or caregivers) and documentation in the medical record.    I,Mitra Faeizi,acting as a Neurosurgeon for Wyvonnia Lora, MD.,have documented all relevant documentation on the behalf of Wyvonnia Lora, MD,as directed by  Wyvonnia Lora, MD while in the presence of Wyvonnia Lora, MD.  ***

## 2023-12-17 ENCOUNTER — Inpatient Hospital Stay (HOSPITAL_BASED_OUTPATIENT_CLINIC_OR_DEPARTMENT_OTHER): Payer: BC Managed Care – PPO | Admitting: Hematology

## 2023-12-17 ENCOUNTER — Inpatient Hospital Stay: Payer: BC Managed Care – PPO | Attending: Hematology

## 2023-12-17 ENCOUNTER — Other Ambulatory Visit: Payer: Self-pay

## 2023-12-17 VITALS — BP 120/70 | HR 82 | Temp 97.6°F | Resp 20 | Wt 228.2 lb

## 2023-12-17 DIAGNOSIS — D513 Other dietary vitamin B12 deficiency anemia: Secondary | ICD-10-CM | POA: Insufficient documentation

## 2023-12-17 DIAGNOSIS — D649 Anemia, unspecified: Secondary | ICD-10-CM

## 2023-12-17 DIAGNOSIS — Z832 Family history of diseases of the blood and blood-forming organs and certain disorders involving the immune mechanism: Secondary | ICD-10-CM

## 2023-12-17 DIAGNOSIS — Z86718 Personal history of other venous thrombosis and embolism: Secondary | ICD-10-CM | POA: Diagnosis not present

## 2023-12-17 DIAGNOSIS — Z79899 Other long term (current) drug therapy: Secondary | ICD-10-CM | POA: Diagnosis not present

## 2023-12-17 DIAGNOSIS — O99111 Other diseases of the blood and blood-forming organs and certain disorders involving the immune mechanism complicating pregnancy, first trimester: Secondary | ICD-10-CM | POA: Insufficient documentation

## 2023-12-17 LAB — CBC WITH DIFFERENTIAL (CANCER CENTER ONLY)
Abs Immature Granulocytes: 0.1 10*3/uL — ABNORMAL HIGH (ref 0.00–0.07)
Basophils Absolute: 0 10*3/uL (ref 0.0–0.1)
Basophils Relative: 0 %
Eosinophils Absolute: 0 10*3/uL (ref 0.0–0.5)
Eosinophils Relative: 0 %
HCT: 34.9 % — ABNORMAL LOW (ref 36.0–46.0)
Hemoglobin: 12.4 g/dL (ref 12.0–15.0)
Immature Granulocytes: 2 %
Lymphocytes Relative: 30 %
Lymphs Abs: 1.6 10*3/uL (ref 0.7–4.0)
MCH: 35.5 pg — ABNORMAL HIGH (ref 26.0–34.0)
MCHC: 35.5 g/dL (ref 30.0–36.0)
MCV: 100 fL (ref 80.0–100.0)
Monocytes Absolute: 0.6 10*3/uL (ref 0.1–1.0)
Monocytes Relative: 11 %
Neutro Abs: 3.1 10*3/uL (ref 1.7–7.7)
Neutrophils Relative %: 57 %
Platelet Count: 148 10*3/uL — ABNORMAL LOW (ref 150–400)
RBC: 3.49 MIL/uL — ABNORMAL LOW (ref 3.87–5.11)
RDW: 12.6 % (ref 11.5–15.5)
WBC Count: 5.4 10*3/uL (ref 4.0–10.5)
nRBC: 0 % (ref 0.0–0.2)

## 2023-12-17 LAB — CMP (CANCER CENTER ONLY)
ALT: 16 U/L (ref 0–44)
AST: 14 U/L — ABNORMAL LOW (ref 15–41)
Albumin: 3.4 g/dL — ABNORMAL LOW (ref 3.5–5.0)
Alkaline Phosphatase: 115 U/L (ref 38–126)
Anion gap: 5 (ref 5–15)
BUN: 8 mg/dL (ref 6–20)
CO2: 24 mmol/L (ref 22–32)
Calcium: 8.8 mg/dL — ABNORMAL LOW (ref 8.9–10.3)
Chloride: 104 mmol/L (ref 98–111)
Creatinine: 0.69 mg/dL (ref 0.44–1.00)
GFR, Estimated: >60 mL/min (ref >60)
Glucose, Bld: 80 mg/dL (ref 70–99)
Potassium: 4 mmol/L (ref 3.5–5.1)
Sodium: 133 mmol/L — ABNORMAL LOW (ref 135–145)
Total Bilirubin: 0.3 mg/dL (ref 0.0–1.2)
Total Protein: 6.4 g/dL — ABNORMAL LOW (ref 6.5–8.1)

## 2023-12-17 LAB — IRON AND IRON BINDING CAPACITY (CC-WL,HP ONLY)
Iron: 147 ug/dL (ref 28–170)
Saturation Ratios: 35 % — ABNORMAL HIGH (ref 10.4–31.8)
TIBC: 419 ug/dL (ref 250–450)
UIBC: 272 ug/dL (ref 148–442)

## 2023-12-17 LAB — FERRITIN: Ferritin: 19 ng/mL (ref 11–307)

## 2023-12-17 LAB — VITAMIN B12: Vitamin B-12: 491 pg/mL (ref 180–914)

## 2023-12-17 LAB — SAMPLE TO BLOOD BANK

## 2023-12-20 DIAGNOSIS — M5459 Other low back pain: Secondary | ICD-10-CM | POA: Diagnosis not present

## 2023-12-20 DIAGNOSIS — Z0289 Encounter for other administrative examinations: Secondary | ICD-10-CM

## 2023-12-20 DIAGNOSIS — R6884 Jaw pain: Secondary | ICD-10-CM | POA: Diagnosis not present

## 2023-12-20 DIAGNOSIS — G4486 Cervicogenic headache: Secondary | ICD-10-CM | POA: Diagnosis not present

## 2023-12-20 DIAGNOSIS — M5093 Cervical disc disorder, unspecified, cervicothoracic region: Secondary | ICD-10-CM | POA: Diagnosis not present

## 2023-12-22 ENCOUNTER — Encounter: Payer: Self-pay | Admitting: Hematology

## 2023-12-22 DIAGNOSIS — O23593 Infection of other part of genital tract in pregnancy, third trimester: Secondary | ICD-10-CM | POA: Diagnosis not present

## 2023-12-22 DIAGNOSIS — O26893 Other specified pregnancy related conditions, third trimester: Secondary | ICD-10-CM | POA: Diagnosis not present

## 2023-12-22 DIAGNOSIS — Z86718 Personal history of other venous thrombosis and embolism: Secondary | ICD-10-CM | POA: Diagnosis not present

## 2023-12-22 DIAGNOSIS — B9689 Other specified bacterial agents as the cause of diseases classified elsewhere: Secondary | ICD-10-CM | POA: Diagnosis not present

## 2023-12-22 DIAGNOSIS — M79661 Pain in right lower leg: Secondary | ICD-10-CM | POA: Diagnosis not present

## 2023-12-22 DIAGNOSIS — O30043 Twin pregnancy, dichorionic/diamniotic, third trimester: Secondary | ICD-10-CM | POA: Diagnosis not present

## 2023-12-22 DIAGNOSIS — Z09 Encounter for follow-up examination after completed treatment for conditions other than malignant neoplasm: Secondary | ICD-10-CM | POA: Diagnosis not present

## 2023-12-22 DIAGNOSIS — Z7901 Long term (current) use of anticoagulants: Secondary | ICD-10-CM | POA: Diagnosis not present

## 2023-12-22 DIAGNOSIS — Z3A32 32 weeks gestation of pregnancy: Secondary | ICD-10-CM | POA: Diagnosis not present

## 2023-12-23 ENCOUNTER — Other Ambulatory Visit: Payer: Self-pay

## 2023-12-23 ENCOUNTER — Ambulatory Visit: Payer: Self-pay | Admitting: Advanced Practice Midwife

## 2023-12-23 VITALS — BP 115/77 | HR 81 | Wt 226.0 lb

## 2023-12-23 DIAGNOSIS — O30043 Twin pregnancy, dichorionic/diamniotic, third trimester: Secondary | ICD-10-CM | POA: Diagnosis not present

## 2023-12-23 DIAGNOSIS — A6000 Herpesviral infection of urogenital system, unspecified: Secondary | ICD-10-CM

## 2023-12-23 DIAGNOSIS — Z86718 Personal history of other venous thrombosis and embolism: Secondary | ICD-10-CM

## 2023-12-23 DIAGNOSIS — O402XX1 Polyhydramnios, second trimester, fetus 1: Secondary | ICD-10-CM

## 2023-12-23 DIAGNOSIS — O0993 Supervision of high risk pregnancy, unspecified, third trimester: Secondary | ICD-10-CM

## 2023-12-23 DIAGNOSIS — O30041 Twin pregnancy, dichorionic/diamniotic, first trimester: Secondary | ICD-10-CM

## 2023-12-23 DIAGNOSIS — Z3A32 32 weeks gestation of pregnancy: Secondary | ICD-10-CM

## 2023-12-23 DIAGNOSIS — R768 Other specified abnormal immunological findings in serum: Secondary | ICD-10-CM

## 2023-12-23 DIAGNOSIS — O403XX1 Polyhydramnios, third trimester, fetus 1: Secondary | ICD-10-CM | POA: Diagnosis not present

## 2023-12-23 DIAGNOSIS — Z0289 Encounter for other administrative examinations: Secondary | ICD-10-CM

## 2023-12-23 DIAGNOSIS — O0990 Supervision of high risk pregnancy, unspecified, unspecified trimester: Secondary | ICD-10-CM

## 2023-12-23 NOTE — Progress Notes (Signed)
  [facility] for [facility]  PRENATAL VISIT NOTE  Subjective:  Melissa Boyd is a 28 y.o. G1P0 at [redacted]w[redacted]d being seen today for ongoing prenatal care.  She is currently monitored for the following issues for this high-risk pregnancy and has Migraines; History of deep vein thrombosis of lower extremity; Food allergy; Paroxysmal supraventricular tachycardia (HCC); Adverse reaction to food, subsequent encounter; Dichorionic diamniotic twin pregnancy in first trimester; Supervision of high risk pregnancy, antepartum; Genital herpes simplex type 2; Abnormal cervical Papanicolaou smear; Headache in pregnancy, antepartum, first trimester; Muscle spasm; Baby B with EICF; and Polyhydramnios, second trimester, fetus 1 on their problem list.   Patient reports increasing contractions. Was check in MAU and cervix was closed. Dx BV Rx Flagyl. Has not started yet. Was also noticing increase LE edema LE Venous doppler Nml.  Contractions: Irregular. Vag. Bleeding: None.  Movement: Present. Denies leaking of fluid.   The following portions of the patient's history were reviewed and updated as appropriate: allergies, current medications, past family history, past medical history, past social history, past surgical history and problem list.   Objective:   Vitals:   12/23/23 1543  BP: 115/77  Pulse: 81  Weight: 226 lb (102.5 kg)    Fetal Status: Fetal Heart Rate (bpm): 136/152   Movement: Present     General:  Alert, oriented and cooperative. Patient is in no acute distress.  Skin: Skin is warm and dry. No rash noted.   Cardiovascular: Normal heart rate noted  Respiratory: Normal respiratory effort, no problems with respiration noted  Abdomen: Soft, gravid, appropriate for gestational age.  Pain/Pressure: Present     Pelvic: Cervical exam performed in the presence of a chaperone      closed and lonf  Extremities: Normal range of motion.  Edema: None  Mental Status: Normal mood and affect. Normal  behavior. Normal judgment and thought content.   Assessment and Plan:  Pregnancy: G1P0 at [redacted]w[redacted]d 1. Seropositive for herpes simplex 2 infection ADDENDUM: Dx 2016 as part of routine STI screening. No Hx lesions.  - Valtrex at 36 weeks 2. Supervision of high risk pregnancy, antepartum  3. Polyhydramnios, second trimester, fetus 1 - Resolved 4. History of deep vein thrombosis of lower extremity - On Lovenox. Recent dopplers Nml 5. Dichorionic diamniotic twin pregnancy in first trimester - Antenatal testing per MFM - PTL precautions reviewed. Cervix closed and long today. 6. [redacted] weeks gestation of pregnancy   Preterm labor symptoms and general obstetric precautions including but not limited to vaginal bleeding, contractions, leaking of fluid and fetal movement were reviewed in detail with the patient. Please refer to After Visit Summary for other counseling recommendations.   Return in about 2 weeks (around 01/06/2024) for ROB.  Future Appointments  Date Time Provider Department Center  12/24/2023 10:00 AM [facility] PROVIDER 1 [facility]  12/24/2023 10:30 AM [facility] US1 [facility]  01/21/2024  9:40 AM Tobb, Kardie, DO [facility] None  02/28/2024 10:00 AM Clem Currier, NP [facility] None    Blu Mcglaun  Felipe Horton [facility] for [employer]

## 2023-12-23 NOTE — Patient Instructions (Signed)

## 2023-12-24 ENCOUNTER — Ambulatory Visit: Admitting: Obstetrics and Gynecology

## 2023-12-24 ENCOUNTER — Ambulatory Visit: Payer: BC Managed Care – PPO | Attending: Maternal & Fetal Medicine

## 2023-12-24 ENCOUNTER — Other Ambulatory Visit: Payer: Self-pay | Admitting: *Deleted

## 2023-12-24 ENCOUNTER — Ambulatory Visit: Payer: BC Managed Care – PPO

## 2023-12-24 VITALS — BP 131/61 | HR 102

## 2023-12-24 DIAGNOSIS — O10012 Pre-existing essential hypertension complicating pregnancy, second trimester: Secondary | ICD-10-CM

## 2023-12-24 DIAGNOSIS — O283 Abnormal ultrasonic finding on antenatal screening of mother: Secondary | ICD-10-CM

## 2023-12-24 DIAGNOSIS — O30042 Twin pregnancy, dichorionic/diamniotic, second trimester: Secondary | ICD-10-CM | POA: Diagnosis not present

## 2023-12-24 DIAGNOSIS — O402XX1 Polyhydramnios, second trimester, fetus 1: Secondary | ICD-10-CM | POA: Diagnosis not present

## 2023-12-24 DIAGNOSIS — O30043 Twin pregnancy, dichorionic/diamniotic, third trimester: Secondary | ICD-10-CM

## 2023-12-24 DIAGNOSIS — O30041 Twin pregnancy, dichorionic/diamniotic, first trimester: Secondary | ICD-10-CM | POA: Diagnosis not present

## 2023-12-24 DIAGNOSIS — O2232 Deep phlebothrombosis in pregnancy, second trimester: Secondary | ICD-10-CM

## 2023-12-24 DIAGNOSIS — O0990 Supervision of high risk pregnancy, unspecified, unspecified trimester: Secondary | ICD-10-CM | POA: Diagnosis not present

## 2023-12-24 DIAGNOSIS — Z3A32 32 weeks gestation of pregnancy: Secondary | ICD-10-CM

## 2023-12-24 DIAGNOSIS — O10912 Unspecified pre-existing hypertension complicating pregnancy, second trimester: Secondary | ICD-10-CM | POA: Insufficient documentation

## 2023-12-24 DIAGNOSIS — E669 Obesity, unspecified: Secondary | ICD-10-CM

## 2023-12-24 DIAGNOSIS — O99213 Obesity complicating pregnancy, third trimester: Secondary | ICD-10-CM | POA: Diagnosis not present

## 2023-12-24 NOTE — Progress Notes (Signed)
  Maternal-Fetal Medicine Consultation Name: Melissa Boyd MRN: [id number]  GA-32 weeks 3 days. Patient with dichorionic-diamniotic twin pregnancy returned for fetal growth assessment. She has a history of deep vein thrombosis (2016) and takes Lovenox prophylaxis.  She does not have gestational diabetes.  Her pressure today at our office is 131/61 mmHg.  Ultrasound Twin A: Maternal left, cephalic presentation, anterior placenta.  Amniotic fluid is normal good fetal activity seen.  Fetal growth is appropriate for gestational age.  Antenatal testing is reassuring.  BPP 8/8.  Twin B: Maternal right, cephalic presentation, anterior placenta.  Amniotic fluid is normal good fetal activity seen.  Fetal growth is appropriate for gestational age.  Antenatal testing is reassuring.  BPP 8/8.  Growth discordancy: 13% (normal).  I discussed mode of delivery.  If both twins have cephalic presentation at that induction, vaginal delivery can be safely performed.  I counseled her on the delivery process. Her chart (MA visit) mentions the patient has primary genital herpes infection.  Patient denies any lesions on the genital area. I sent an in-basket message to her provider. If primary genital herpes infection was acquired within 8 to 10 weeks before expected delivery, it is safe to perform cesarean section.  The absence of other high-risk factors, weekly antenatal testing is not recommended.  Recommendations -An appointment was made for her to return in 4 weeks for fetal growth assessment and BPP. -BPP at [redacted] weeks gestation. -Delivery at [redacted] weeks gestation.  Consultation including face-to-face (more than 50%) counseling 20 minutes.

## 2023-12-27 ENCOUNTER — Encounter: Payer: Self-pay | Admitting: Advanced Practice Midwife

## 2023-12-27 DIAGNOSIS — R6884 Jaw pain: Secondary | ICD-10-CM | POA: Diagnosis not present

## 2023-12-27 DIAGNOSIS — M5459 Other low back pain: Secondary | ICD-10-CM | POA: Diagnosis not present

## 2023-12-27 DIAGNOSIS — G4486 Cervicogenic headache: Secondary | ICD-10-CM | POA: Diagnosis not present

## 2023-12-27 DIAGNOSIS — M5093 Cervical disc disorder, unspecified, cervicothoracic region: Secondary | ICD-10-CM | POA: Diagnosis not present

## 2023-12-29 DIAGNOSIS — R6884 Jaw pain: Secondary | ICD-10-CM | POA: Diagnosis not present

## 2023-12-29 DIAGNOSIS — M5459 Other low back pain: Secondary | ICD-10-CM | POA: Diagnosis not present

## 2023-12-29 DIAGNOSIS — M5093 Cervical disc disorder, unspecified, cervicothoracic region: Secondary | ICD-10-CM | POA: Diagnosis not present

## 2023-12-29 DIAGNOSIS — G4486 Cervicogenic headache: Secondary | ICD-10-CM | POA: Diagnosis not present

## 2024-01-03 DIAGNOSIS — G4486 Cervicogenic headache: Secondary | ICD-10-CM | POA: Diagnosis not present

## 2024-01-03 DIAGNOSIS — M5459 Other low back pain: Secondary | ICD-10-CM | POA: Diagnosis not present

## 2024-01-03 DIAGNOSIS — M5093 Cervical disc disorder, unspecified, cervicothoracic region: Secondary | ICD-10-CM | POA: Diagnosis not present

## 2024-01-03 DIAGNOSIS — R6884 Jaw pain: Secondary | ICD-10-CM | POA: Diagnosis not present

## 2024-01-04 DIAGNOSIS — Z3A33 33 weeks gestation of pregnancy: Secondary | ICD-10-CM | POA: Diagnosis not present

## 2024-01-04 DIAGNOSIS — Z880 Allergy status to penicillin: Secondary | ICD-10-CM | POA: Diagnosis not present

## 2024-01-04 DIAGNOSIS — Z79899 Other long term (current) drug therapy: Secondary | ICD-10-CM | POA: Diagnosis not present

## 2024-01-04 DIAGNOSIS — Z3A34 34 weeks gestation of pregnancy: Secondary | ICD-10-CM | POA: Diagnosis not present

## 2024-01-04 DIAGNOSIS — O99353 Diseases of the nervous system complicating pregnancy, third trimester: Secondary | ICD-10-CM | POA: Diagnosis not present

## 2024-01-04 DIAGNOSIS — O99891 Other specified diseases and conditions complicating pregnancy: Secondary | ICD-10-CM | POA: Diagnosis not present

## 2024-01-04 DIAGNOSIS — Z832 Family history of diseases of the blood and blood-forming organs and certain disorders involving the immune mechanism: Secondary | ICD-10-CM | POA: Diagnosis not present

## 2024-01-04 DIAGNOSIS — O4703 False labor before 37 completed weeks of gestation, third trimester: Secondary | ICD-10-CM | POA: Diagnosis not present

## 2024-01-04 DIAGNOSIS — N83202 Unspecified ovarian cyst, left side: Secondary | ICD-10-CM | POA: Diagnosis not present

## 2024-01-04 DIAGNOSIS — Z91013 Allergy to seafood: Secondary | ICD-10-CM | POA: Diagnosis not present

## 2024-01-04 DIAGNOSIS — Z881 Allergy status to other antibiotic agents status: Secondary | ICD-10-CM | POA: Diagnosis not present

## 2024-01-04 DIAGNOSIS — Z888 Allergy status to other drugs, medicaments and biological substances status: Secondary | ICD-10-CM | POA: Diagnosis not present

## 2024-01-04 DIAGNOSIS — G43909 Migraine, unspecified, not intractable, without status migrainosus: Secondary | ICD-10-CM | POA: Diagnosis not present

## 2024-01-04 DIAGNOSIS — Z86718 Personal history of other venous thrombosis and embolism: Secondary | ICD-10-CM | POA: Diagnosis not present

## 2024-01-04 DIAGNOSIS — O368230 Fetal anemia and thrombocytopenia, third trimester, not applicable or unspecified: Secondary | ICD-10-CM | POA: Diagnosis not present

## 2024-01-04 DIAGNOSIS — Z7901 Long term (current) use of anticoagulants: Secondary | ICD-10-CM | POA: Diagnosis not present

## 2024-01-04 DIAGNOSIS — O30043 Twin pregnancy, dichorionic/diamniotic, third trimester: Secondary | ICD-10-CM | POA: Diagnosis not present

## 2024-01-10 ENCOUNTER — Encounter: Admitting: Obstetrics and Gynecology

## 2024-01-10 DIAGNOSIS — M5093 Cervical disc disorder, unspecified, cervicothoracic region: Secondary | ICD-10-CM | POA: Diagnosis not present

## 2024-01-10 DIAGNOSIS — M5459 Other low back pain: Secondary | ICD-10-CM | POA: Diagnosis not present

## 2024-01-10 DIAGNOSIS — R6884 Jaw pain: Secondary | ICD-10-CM | POA: Diagnosis not present

## 2024-01-10 DIAGNOSIS — G4486 Cervicogenic headache: Secondary | ICD-10-CM | POA: Diagnosis not present

## 2024-01-11 DIAGNOSIS — O0993 Supervision of high risk pregnancy, unspecified, third trimester: Secondary | ICD-10-CM | POA: Diagnosis not present

## 2024-01-11 DIAGNOSIS — O30043 Twin pregnancy, dichorionic/diamniotic, third trimester: Secondary | ICD-10-CM | POA: Diagnosis not present

## 2024-01-11 DIAGNOSIS — O30041 Twin pregnancy, dichorionic/diamniotic, first trimester: Secondary | ICD-10-CM | POA: Diagnosis not present

## 2024-01-11 DIAGNOSIS — O99413 Diseases of the circulatory system complicating pregnancy, third trimester: Secondary | ICD-10-CM | POA: Diagnosis not present

## 2024-01-11 DIAGNOSIS — O4703 False labor before 37 completed weeks of gestation, third trimester: Secondary | ICD-10-CM | POA: Diagnosis not present

## 2024-01-11 DIAGNOSIS — Z3A34 34 weeks gestation of pregnancy: Secondary | ICD-10-CM | POA: Diagnosis not present

## 2024-01-11 DIAGNOSIS — B3731 Acute candidiasis of vulva and vagina: Secondary | ICD-10-CM | POA: Diagnosis not present

## 2024-01-12 DIAGNOSIS — G4486 Cervicogenic headache: Secondary | ICD-10-CM | POA: Diagnosis not present

## 2024-01-12 DIAGNOSIS — R6884 Jaw pain: Secondary | ICD-10-CM | POA: Diagnosis not present

## 2024-01-12 DIAGNOSIS — M5093 Cervical disc disorder, unspecified, cervicothoracic region: Secondary | ICD-10-CM | POA: Diagnosis not present

## 2024-01-12 DIAGNOSIS — M5459 Other low back pain: Secondary | ICD-10-CM | POA: Diagnosis not present

## 2024-01-14 ENCOUNTER — Encounter: Admitting: Family Medicine

## 2024-01-17 ENCOUNTER — Ambulatory Visit: Payer: BC Managed Care – PPO | Admitting: Adult Health

## 2024-01-17 DIAGNOSIS — Z7982 Long term (current) use of aspirin: Secondary | ICD-10-CM | POA: Diagnosis not present

## 2024-01-17 DIAGNOSIS — I4719 Other supraventricular tachycardia: Secondary | ICD-10-CM | POA: Diagnosis not present

## 2024-01-17 DIAGNOSIS — Z79899 Other long term (current) drug therapy: Secondary | ICD-10-CM | POA: Diagnosis not present

## 2024-01-17 DIAGNOSIS — Z87410 Personal history of cervical dysplasia: Secondary | ICD-10-CM | POA: Diagnosis not present

## 2024-01-17 DIAGNOSIS — O328XX2 Maternal care for other malpresentation of fetus, fetus 2: Secondary | ICD-10-CM | POA: Diagnosis not present

## 2024-01-17 DIAGNOSIS — O9902 Anemia complicating childbirth: Secondary | ICD-10-CM | POA: Diagnosis not present

## 2024-01-17 DIAGNOSIS — Z880 Allergy status to penicillin: Secondary | ICD-10-CM | POA: Diagnosis not present

## 2024-01-17 DIAGNOSIS — Z832 Family history of diseases of the blood and blood-forming organs and certain disorders involving the immune mechanism: Secondary | ICD-10-CM | POA: Diagnosis not present

## 2024-01-17 DIAGNOSIS — O30041 Twin pregnancy, dichorionic/diamniotic, first trimester: Secondary | ICD-10-CM | POA: Diagnosis not present

## 2024-01-17 DIAGNOSIS — Z3A35 35 weeks gestation of pregnancy: Secondary | ICD-10-CM | POA: Diagnosis not present

## 2024-01-17 DIAGNOSIS — D6959 Other secondary thrombocytopenia: Secondary | ICD-10-CM | POA: Diagnosis not present

## 2024-01-17 DIAGNOSIS — O9912 Other diseases of the blood and blood-forming organs and certain disorders involving the immune mechanism complicating childbirth: Secondary | ICD-10-CM | POA: Diagnosis not present

## 2024-01-17 DIAGNOSIS — Z888 Allergy status to other drugs, medicaments and biological substances status: Secondary | ICD-10-CM | POA: Diagnosis not present

## 2024-01-17 DIAGNOSIS — O321XX2 Maternal care for breech presentation, fetus 2: Secondary | ICD-10-CM | POA: Diagnosis not present

## 2024-01-17 DIAGNOSIS — Z91013 Allergy to seafood: Secondary | ICD-10-CM | POA: Diagnosis not present

## 2024-01-17 DIAGNOSIS — Z87892 Personal history of anaphylaxis: Secondary | ICD-10-CM | POA: Diagnosis not present

## 2024-01-17 DIAGNOSIS — I471 Supraventricular tachycardia, unspecified: Secondary | ICD-10-CM | POA: Diagnosis not present

## 2024-01-17 DIAGNOSIS — D649 Anemia, unspecified: Secondary | ICD-10-CM | POA: Diagnosis not present

## 2024-01-17 DIAGNOSIS — O30043 Twin pregnancy, dichorionic/diamniotic, third trimester: Secondary | ICD-10-CM | POA: Diagnosis not present

## 2024-01-17 DIAGNOSIS — K219 Gastro-esophageal reflux disease without esophagitis: Secondary | ICD-10-CM | POA: Diagnosis not present

## 2024-01-17 DIAGNOSIS — O9942 Diseases of the circulatory system complicating childbirth: Secondary | ICD-10-CM | POA: Diagnosis not present

## 2024-01-17 DIAGNOSIS — Z881 Allergy status to other antibiotic agents status: Secondary | ICD-10-CM | POA: Diagnosis not present

## 2024-01-17 DIAGNOSIS — Z86718 Personal history of other venous thrombosis and embolism: Secondary | ICD-10-CM | POA: Diagnosis not present

## 2024-01-17 DIAGNOSIS — O9962 Diseases of the digestive system complicating childbirth: Secondary | ICD-10-CM | POA: Diagnosis not present

## 2024-01-20 NOTE — Discharge Summary (Signed)
 Obstetrics Postpartum Discharge Summary   Admit Date:  01/17/2024 Discharge Date:  01/20/2024  Primary OB: [facility]     Admission Diagnoses:  Melissa Boyd 28 y.o. [id number], [redacted]w[redacted]d admitted for PTL.   Pregnancy / Antepartum complications:    Migraines   Paroxysmal supraventricular tachycardia (CMS/HHS-HCC)   Cervical high risk HPV (human papillomavirus) test positive   CIN II (cervical intraepithelial neoplasia II)   Cyst of ovary   Dichorionic diamniotic twin pregnancy in first trimester (HHS-HCC)   Seropositive for herpes simplex 2 infection   History of deep venous thrombosis   Multiple thyroid  nodules   Reactive airway disease (HHS-HCC)   Vestibular dysfunction   Gastroesophageal reflux disease, unspecified whether esophagitis present   Gestational thrombocytopenia, third trimester (CMS/HHS-HCC)   Third degree perineal laceration during delivery with less than 50% tear of external anal sphincter (HHS-HCC)   Discharge Diagnoses:  S/p FAVD followed by breech extraction / 3A at [redacted]w[redacted]d iso preterm labor on Jan 18, 2024 QBL/EBL <redacted file path>: 600 mL     Melissa Boyd  Patsie [[id number]]  Vaginal, Forceps    Lowers, Baby Girl PATTE WINKEL [[id number]]  Vaginal, Breech  on Jan 18, 2024  Delivery Details   Maternal Gravity and Parity: [id number] Gestational Age at Delivery: [redacted]w[redacted]d Baby Name / Sex: Baby Boyd  Melissa Boyd / female Date/Time of Birth: 01/18/2024 / 8:41 AM Birth Weight: 2.965 kg (6 lb 8.6 oz) Apgars: 8  9        Delivery type: Vaginal, Forceps Lacerations:   Episiotomy: None  Perineal: 3a                       Placenta: Spontaneous / Intact Labor Complications:  None Anesthesia: Epidural;Combined Spinal/Epidural (CSE)                  Delivering clinician Attending on call Delivery Nurse Delivery Nurse Registered Nurse Registered Nurse Intern Physician Alena Randall Silver Lauraine Rollo, MD Elige Lacinda Keren Charmaine Darra Ginger Nadyne Rocky JAYSON Penvose, [place] Inocente Silver Lauraine Rollo Cooks Name / Sex: Baby Girl B Melissa Boyd / female Date/Time of Birth: 01/18/2024 / 8:44 AM Birth Weight: 2.24 kg (4 lb 15 oz) Apgars: 5  9        Delivery type: Vaginal, Breech Lacerations:   Episiotomy: None  Perineal: 3a - repaired     Labial: right - repaired             Placenta: Spontaneous / Intact Labor Complications:  None Anesthesia: Epidural;Combined Spinal/Epidural (CSE)                  Delivering clinician Attending on call Delivery Nurse Delivery Nurse Registered Nurse Registered Nurse Intern Physician Licensed Practical Nurse Alena Randall Silver Lauraine Rollo, MD Elige Lacinda Keren Charmaine Darra Ginger Nadyne Rocky JAYSON Penvose, [place] Inocente Silver, Sarah Rollo [place], [place] Hospital Course:    Antepartum/Intrapartum: Presented with n/v and was found to be contracting, progressed to 5/90/-1 in triage. She was admitted for PTL. Patient had already received BMZ 4/22-4/23 during a prior antepartum admission. She was AROM'd and augmented with pitocin, progressed to complete dilation and delivered viable twins via FAVD followed by breech extraction complicated by a 3a perineal laceration. EBL was 600 mL.   Postpartum: Melissa Boyd was transitioned to Postpartum service. She recovered postpartum with hospital course outlined below.  On day of discharge, she  had met all postpartum goals.   Neonatal Course: unremarkable  Hospital Course by Problem:   Problems / Action Items: 3rd degree laceration (3A) FAVD - 5/6 - single dose cefoxitin 2 g given via IV at 1145 A.M. - Bowel regimen ordered (colace BID & miralax daily, scheduled) - Exam prior to discharge today  - Ambulatory referral to Pelvic PT placed + inpatient PT  - Instruct patient to call for perineal check ONLY if having problems (no routine check)   History of deep  vein thrombosis of lower extremity Seen by Dr. Amadeo in 2016 for history of DVT of left lower extremity. Her deep vein thrombosis was thought to be provoked by long car ride over 10 hours while on oral contraceptives. Brother with hx of factor V Leiden. Negative testing for factor 5 Leiden and prothrombin gene mutation 07/2023. - Taking prophylactic Lovenox  60 mg daily throughout pregnancy - Per hematology visit on 08/17/2023: given previous hx of hormonally triggered VTE would recommend Lovenox  Rayne 60mg  daily during pregnancy and for 6 weeks post partum (ordered). No indication for long term anticoagulation outside of these recommendations. - Currently on SCDs, encourage ambulation   Anemia - Admission Hgb 13.2 - Continue to monitor for sx of anemia  Multiple thyroid  nodules No medications Thyroid  panel: TSH 1.97, FT4 0.5    Thrombocytopenia Admission CBC with plts of 158 --> 134 after delivery   Paroxysmal SVT - Continue adequate hydration with water and electrolyte drinks - Compression hose  - S/p holter monitor  - Tele x 24 hours postpartum  - Has a virtual cardiology appt scheduled 5/9   History of CIN II - LSIL 02/20/2022, s/p LEEP - 07/29/2023 pap NILM, HPV negative > Due for Pap 07/2024 per [facility]   Health Maintenance / Postpartum:   ABO/RH: B Positive - Rhogam not indicated  VTE Risk: High - Recommend 6 weeks LMWH or subcutaneous unfractionated heparin; if C-hyst with no other high risk factors, then recommend for 4 weeks., Rx Lovenox : Weight 100-150kg: Lovenox  60 mg daily, prescribed for 6 weeks  Immunizations:   Jump to review immunizations <redacted file path>  Action  Rubella  : immune  TdaP: 11/26/2023 up to date  Influenza: 06/03/2023 up to date   Feeding method: breastfeeding: Yes, Pumping: No Contraception plan at discharge: [place]=rd IUD   to be placed postpartum in OB/GYN clinic or the health department. ; paraguard IUD  Pap:  07/29/2023 pap NILM, HPV  negative  Discharge:  Opioid Risk Assessment:  Prior to initiating opiate therapy, the [place] CSRS database was reviewed. The database showed: other narcotic prescriptions present - inconsistent provider(s). This patient demonstrates low risk for problems with opioid use.  Disposition: Home  Follow-up appointment: Routine postpartum check in 4-6 weeks with [facility]  The delivery summary has been signed and the discharge summary has been refreshed before signing.   LONELL CHE, MD   01/20/2024 ----------------------------------------------------- Future Appointments  Date Time Provider Department Center  02/23/2024  3:20 PM Lou Gondola, MD [facility] [place] [facility]  05/30/2024  9:00 AM Myra Kins, PT [facility]     Medication List     START taking these medications    acetaminophen  325 MG tablet Commonly known as: TYLENOL  Take 2 tablets (650 mg total) by mouth every 4 (four) hours as needed (Pain unrelieved by ibuprofen  or ibuprofen  contraindicated) for up to 10 days   docusate 100 MG capsule Commonly known as: COLACE Take 1 capsule (100 mg total) by mouth  2 (two) times daily for 10 days   hydrocortisone  1 % cream Apply topically 3 (three) times daily   ibuprofen  600 MG tablet Commonly known as: MOTRIN  Take 1 tablet (600 mg total) by mouth every 6 (six) hours as needed for up to 10 days   oxyCODONE 5 MG immediate release tablet Commonly known as: ROXICODONE Take 1 tablet (5 mg total) by mouth every 4 (four) hours as needed for Pain for up to 5 days   polyethylene glycol packet Commonly known as: MIRALAX Take 1 packet (17 g total) by mouth once daily for 10 days Mix in 4-8ounces of fluid prior to taking.       CHANGE how you take these medications    * enoxaparin  60 mg/0.6 mL injection syringe Commonly known as: LOVENOX  Inject 0.6 mLs (60 mg total) subcutaneously once daily for 42 days in abdomen, rotating site with each injection. What  changed: You were already taking a medication with the same name, and this prescription was added. Make sure you understand how and when to take each.   * enoxaparin  60 mg/0.6 mL injection syringe Commonly known as: LOVENOX  What changed: Another medication with the same name was added. Make sure you understand how and when to take each.      * * This list has 2 medication(s) that are the same as other medications prescribed for you. Read the directions carefully, and ask your doctor or other care provider to review them with you.          CONTINUE taking these medications    aspirin 81 mg Cap   EPIPEN  2-PAK 0.3 mg/0.3 mL auto-injector Generic drug: EPINEPHrine    ferrous sulfate 325 (65 FE) MG EC tablet   fluconazole 150 MG tablet Commonly known as: DIFLUCAN Take 1 tablet (150 mg total) by mouth every third day as needed for up to 2 doses   iron polysaccharides 150 mg iron capsule Commonly known as: FERREX   NIFEdipine 10 MG immediate release capsule Commonly known as: PROCARDIA Take 1 capsule (10 mg total) by mouth once for 1 dose   prenatal vit-iron fum-folic ac tablet Commonly known as: PRENAVITE   TOSYMRA  10 mg/actuation Spry Generic drug: SUMAtriptan    VITAMIN B-12 ORAL       ASK your doctor about these medications    multivitamin tablet   rivaroxaban  15 mg tablet Commonly known as: XARELTO    SUMAtriptan  100 MG tablet Commonly known as: IMITREX    topiramate  25 MG tablet Commonly known as: TOPAMAX          Where to Get Your Medications     These medications were sent to [id number] [address], [place] - [address]  [address], [place] [address]    Phone: [phone]  acetaminophen  325 MG tablet docusate 100 MG capsule enoxaparin  60 mg/0.6 mL injection syringe hydrocortisone  1 % cream ibuprofen  600 MG tablet oxyCODONE 5 MG immediate release tablet polyethylene glycol packet     ------------------------------------------------------------------------------- Attestation signed by Tanja Delon NOVAK, MD at 01/30/2024 11:33 PM MFM Attending  Attestation Statement:   This service was rendered under my overall direction and control, and I was immediately available via phone/pager or present on site.  Delon NOVAK Tanja, MD   ------------------------------------------------------------------------------- *Some images could not be shown.

## 2024-01-21 ENCOUNTER — Ambulatory Visit: Attending: Cardiology | Admitting: Cardiology

## 2024-01-21 ENCOUNTER — Encounter: Payer: Self-pay | Admitting: Cardiology

## 2024-01-21 VITALS — BP 115/66 | HR 106 | Ht 67.0 in | Wt 224.0 lb

## 2024-01-21 DIAGNOSIS — I471 Supraventricular tachycardia, unspecified: Secondary | ICD-10-CM

## 2024-01-21 NOTE — Progress Notes (Addendum)
 Cardio-Obstetrics Clinic  Follow Up Note   Date:  01/25/2024   ID:  Melissa Boyd, DOB 1996/08/20, MRN [id number]  PCP:  Perley Bradley, MD   [facility] Providers Cardiologist:  Jerryl Morin, DO  Electrophysiologist:  None        Referring MD: Perley Bradley, MD   Chief Complaint: " I am doing much better"   She is at home. I am in the clinic   Virtual Visit via Video  Note . I connected with the patient today by a   video enabled telemedicine application and verified that I am speaking with the correct person using two identifiers.    History of Present Illness:    Melissa Boyd is a 28 y.o. female [G1P0] who returns for a post partum cardiology visit. She had delivered with a set of twin.  She offers no complaints today - She is 3 days postpartum.  Prior CV Studies Reviewed: The following studies were reviewed today: Echo and zio  Past Medical History:  Diagnosis Date   Allergy    Phreesia 04/29/2020   DVT (deep venous thrombosis) (HCC)    Headache, migraine    HSV-2 seropositive 07/21/2023   Ovarian cyst 05/23/2023   Subchorionic hematoma    Urticaria     Past Surgical History:  Procedure Laterality Date   SHOULDER SURGERY     left   WISDOM TOOTH EXTRACTION        OB History     Gravida  1   Para      Term      Preterm      AB      Living         SAB      IAB      Ectopic      Multiple  1   Live Births                  Current Medications: Current Meds  Medication Sig   acetaminophen  (TYLENOL ) 325 MG tablet Take 325 mg by mouth every 4 (four) hours as needed.   cyanocobalamin  (VITAMIN B12) 1000 MCG tablet Take 1,000 mcg by mouth daily.   Docusate Sodium (DSS) 100 MG CAPS Take 100 mg by mouth.   enoxaparin  (LOVENOX ) 60 MG/0.6ML injection Inject 0.6 mLs (60 mg total) into the skin daily.   EPINEPHrine  (AUVI-Q ) 0.3 mg/0.3 mL IJ SOAJ injection Use as directed for severe allergic reactions   GAVILAX 17 GM/SCOOP  powder Take by mouth daily.   hydrocortisone  2.5 % cream Apply topically 2 (two) times daily.   ibuprofen  (ADVIL ) 600 MG tablet Take 600 mg by mouth every 6 (six) hours as needed.   oxyCODONE (OXY IR/ROXICODONE) 5 MG immediate release tablet Take 5 mg by mouth every 4 (four) hours as needed.   Prenat w/o A Vit-FeFum-FePo-FA (FOLIVANE-OB) 85-1 MG CAPS Take 1 capsule by mouth daily.   Prenatal Vit-Fe Fumarate-FA (PRENATAL MULTIVITAMIN) TABS tablet Take 1 tablet by mouth daily at 12 noon.   SUMAtriptan  (IMITREX ) 20 MG/ACT nasal spray Place 1 spray (20 mg total) into the nose every 2 (two) hours as needed for migraine or headache. May repeat in 2 hours if headache persists or recurs.     Allergies:   Other, Shellfish allergy, Naproxen, Amoxicillin, and Doxycycline   Social History   Socioeconomic History   Marital status: Single    Spouse name: Not on file   Number of children: Not on file   Years  of education: Not on file   Highest education level: Not on file  Occupational History   Not on file  Tobacco Use   Smoking status: Never   Smokeless tobacco: Never  Vaping Use   Vaping status: Never Used  Substance and Sexual Activity   Alcohol use: Not Currently   Drug use: No   Sexual activity: Yes    Birth control/protection: None  Other Topics Concern   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 She enjoys track and field.   Lives in [place].    Right handed   Caffeine: maybe once a week. Albin Huh)      Social Drivers of Health   Financial Resource Strain: Low Risk  (01/17/2024)   Received from [facility]   Overall Financial Resource Strain (CARDIA)    Difficulty of Paying Living Expenses: Not hard at all  Food Insecurity: No Food Insecurity (01/17/2024)   Received from [facility]   Hunger Vital Sign    Worried About Running Out of  Food in the Last Year: Never true    Ran Out of Food in the Last Year: Never true  Transportation Needs: No Transportation Needs (01/17/2024)   Received from [facility] - Transportation    In the past 12 months, has lack of transportation kept you from medical appointments or from getting medications?: No    Lack of Transportation (Non-Medical): No  Physical Activity: Sufficiently Active (01/07/2024)   Received from [facility]   Exercise Vital Sign    Days of Exercise per Week: 4 days    Minutes of Exercise per Session: 40 min  Stress: Stress Concern Present (01/07/2024)   Received from [facility] - Occupational Stress Questionnaire    Feeling of Stress : To some extent  Social Connections: Unknown (01/07/2024)   Received from [facility]   Social Connection and Isolation Panel [NHANES]    Frequency of Communication with Friends and Family: More than three times a week    Frequency of Social Gatherings with Friends and Family: More than three times a week    Attends Religious Services: Patient declined    Database administrator or Organizations: Patient declined    Attends Engineer, structural: Patient declined    Marital Status: Patient declined      Family History  Problem Relation Age of Onset   Anemia Father        low iron    Alzheimer's disease Paternal Grandfather    Migraines Brother    Hypertension Maternal Aunt    Migraines Cousin        Mother's 1st cousin; has them really bad    Migraines Cousin       ROS:   Please see the history of present illness.     All other systems reviewed and are negative.   Labs/EKG Reviewed:    EKG:  None today    Recent Labs: 10/12/2023: Magnesium 1.7; TSH 1.717 12/17/2023: ALT 16; BUN 8; Creatinine 0.69; Hemoglobin 12.4; Platelet Count 148; Potassium 4.0; Sodium 133   Recent Lipid Panel No  results found for: "CHOL", "TRIG", "HDL", "CHOLHDL", "LDLCALC", "LDLDIRECT"  Physical Exam:    VS:  BP 115/66 (BP Location: Right Arm, Patient Position: Sitting, Cuff  Size: Normal)   Pulse (!) 106   Ht 5\' 7"  (1.702 m)   Wt 224 lb (101.6 kg)   LMP 05/08/2023 (Exact Date) Comment: + pregnancy test 9/29, irreg cycles  BMI 35.08 kg/m     Wt Readings from Last 3 Encounters:  01/21/24 224 lb (101.6 kg)  12/23/23 226 lb (102.5 kg)  12/17/23 228 lb 3.2 oz (103.5 kg)      Risk Assessment/Risk Calculators:                 ASSESSMENT & PLAN:    PSVT Postpartum cardiovascular visit   She still is experiencing some palpitations.  We are going to monitor closely.  She will follow-up in 10 to 12 weeks.  If she still is experiencing no symptoms we will place another monitor on the patient.  The patient understands the need to lose weight with diet and exercise. We have discussed specific strategies for this.  Total time spent   Patient Instructions  Medication Instructions:  Your physician recommends that you continue on your current medications as directed. Please refer to the Current Medication list given to you today.  *If you need a refill on your cardiac medications before your next appointment, please call your pharmacy*   Follow-Up: At [facility], you and your health needs are our priority.  As part of our continuing mission to provide you with exceptional heart care, our providers are all part of one team.  This team includes your primary Cardiologist (physician) and Advanced Practice Providers or APPs (Physician Assistants and Nurse Practitioners) who all work together to provide you with the care you need, when you need it.  Your next appointment:   10-12 week(s)  Provider:   Antario Yasuda, DO          Dispo:  No follow-ups on file.   Medication Adjustments/Labs and Tests Ordered: Current medicines are reviewed at length with the patient today.  Concerns  regarding medicines are outlined above.  Tests Ordered: No orders of the defined types were placed in this encounter.  Medication Changes: No orders of the defined types were placed in this encounter.

## 2024-01-21 NOTE — Patient Instructions (Signed)
 Medication Instructions:  Your physician recommends that you continue on your current medications as directed. Please refer to the Current Medication list given to you today.  *If you need a refill on your cardiac medications before your next appointment, please call your pharmacy*   Follow-Up: At [facility], you and your health needs are our priority.  As part of our continuing mission to provide you with exceptional heart care, our providers are all part of one team.  This team includes your primary Cardiologist (physician) and Advanced Practice Providers or APPs (Physician Assistants and Nurse Practitioners) who all work together to provide you with the care you need, when you need it.  Your next appointment:   10-12 week(s)  Provider:   Kardie Tobb, DO

## 2024-01-24 DIAGNOSIS — Z0289 Encounter for other administrative examinations: Secondary | ICD-10-CM

## 2024-01-27 ENCOUNTER — Telehealth: Payer: Self-pay | Admitting: Family Medicine

## 2024-01-27 NOTE — Telephone Encounter (Signed)
 MRN: [id number] DOB: 10-25-1995  28 y.o. female Cleotilde Olam CROME, MD Allergies  Allergen Reactions  . Other Anaphylaxis    Any products that contain shellfish  . Shellfish Containing Products Anaphylaxis and Other (See Comments)  . Amoxicillin Other (See Comments) and Rash  . Doxycycline Hives and Other (See Comments)  . Naproxen Nausea And Vomiting     Wt Readings from Last 1 Encounters:  01/17/24 0944 (!) 101.6 kg (224 lb)     OPENING STATEMENT GIVEN. PT VERIFIED WITH 3 HIPAA VERIFIERS.   Patient is not experiencing new/worsening symptoms.  Mom left wristlet in room message routed to PCP   Care Advice given per protocol .  Caller verbalizes understanding and is without questions at this time.                        Reason for Disposition . [1] Caller requesting NON-URGENT health information AND [2] PCP's office is the best resource  Additional Information . Negative: [1] Caller is not with the adult (patient) AND [2] reporting urgent symptoms . Negative: Lab result questions . Negative: Medication questions . Negative: Caller cannot be reached by phone . Negative: Caller has already spoken to PCP or another triager . Negative: RN needs further essential information from caller in order to complete triage . Negative: Requesting regular office appointment  Answer Assessment - Initial Assessment Questions 1. REASON FOR CALL or QUESTION: What is your reason for calling today? or How can I best help you? or What question do you have that I can help answer? Mom left her [employer] brown wristlet in room 21 of lactation room around 1530  Protocols used: Information Only Call-A-AH

## 2024-01-27 NOTE — Telephone Encounter (Signed)
 I contacted the patient to reschedule her canceled appointment. She informed me that she has already had the baby and believes the hospital scheduled her postpartum visit at [facility]. She will send a MyChart message if she needs to schedule with us .

## 2024-01-28 ENCOUNTER — Other Ambulatory Visit

## 2024-01-28 ENCOUNTER — Ambulatory Visit

## 2024-01-31 DIAGNOSIS — M6281 Muscle weakness (generalized): Secondary | ICD-10-CM | POA: Diagnosis not present

## 2024-02-03 DIAGNOSIS — Z86718 Personal history of other venous thrombosis and embolism: Secondary | ICD-10-CM | POA: Diagnosis not present

## 2024-02-03 DIAGNOSIS — E042 Nontoxic multinodular goiter: Secondary | ICD-10-CM | POA: Diagnosis not present

## 2024-02-22 ENCOUNTER — Other Ambulatory Visit: Payer: Self-pay | Admitting: Adult Health

## 2024-02-28 ENCOUNTER — Telehealth (INDEPENDENT_AMBULATORY_CARE_PROVIDER_SITE_OTHER): Payer: BC Managed Care – PPO | Admitting: Adult Health

## 2024-02-28 DIAGNOSIS — G43109 Migraine with aura, not intractable, without status migrainosus: Secondary | ICD-10-CM
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
## 2024-03-01 ENCOUNTER — Encounter: Payer: Self-pay | Admitting: Hematology

## 2024-03-01 DIAGNOSIS — Z1389 Encounter for screening for other disorder: Secondary | ICD-10-CM | POA: Diagnosis not present

## 2024-03-03 ENCOUNTER — Inpatient Hospital Stay (HOSPITAL_COMMUNITY): Admit: 2024-03-03 | Payer: BC Managed Care – PPO

## 2024-03-10 DIAGNOSIS — Z30430 Encounter for insertion of intrauterine contraceptive device: Secondary | ICD-10-CM | POA: Diagnosis not present

## 2024-03-16 DIAGNOSIS — M6281 Muscle weakness (generalized): Secondary | ICD-10-CM | POA: Diagnosis not present

## 2024-03-16 DIAGNOSIS — N941 Unspecified dyspareunia: Secondary | ICD-10-CM | POA: Diagnosis not present

## 2024-03-20 DIAGNOSIS — L700 Acne vulgaris: Secondary | ICD-10-CM | POA: Diagnosis not present

## 2024-03-24 DIAGNOSIS — N941 Unspecified dyspareunia: Secondary | ICD-10-CM | POA: Diagnosis not present

## 2024-03-24 DIAGNOSIS — M6281 Muscle weakness (generalized): Secondary | ICD-10-CM | POA: Diagnosis not present

## 2024-03-31 DIAGNOSIS — Z124 Encounter for screening for malignant neoplasm of cervix: Secondary | ICD-10-CM | POA: Diagnosis not present

## 2024-04-10 DIAGNOSIS — Z0000 Encounter for general adult medical examination without abnormal findings: Secondary | ICD-10-CM | POA: Diagnosis not present

## 2024-04-10 DIAGNOSIS — Z23 Encounter for immunization: Secondary | ICD-10-CM | POA: Diagnosis not present

## 2024-04-10 DIAGNOSIS — E042 Nontoxic multinodular goiter: Secondary | ICD-10-CM | POA: Diagnosis not present

## 2024-04-12 DIAGNOSIS — N941 Unspecified dyspareunia: Secondary | ICD-10-CM | POA: Diagnosis not present

## 2024-04-12 DIAGNOSIS — M6281 Muscle weakness (generalized): Secondary | ICD-10-CM | POA: Diagnosis not present

## 2024-04-24 DIAGNOSIS — M6281 Muscle weakness (generalized): Secondary | ICD-10-CM | POA: Diagnosis not present

## 2024-04-24 DIAGNOSIS — N941 Unspecified dyspareunia: Secondary | ICD-10-CM | POA: Diagnosis not present

## 2024-05-05 DIAGNOSIS — E042 Nontoxic multinodular goiter: Secondary | ICD-10-CM | POA: Diagnosis not present

## 2024-05-09 DIAGNOSIS — M6281 Muscle weakness (generalized): Secondary | ICD-10-CM | POA: Diagnosis not present

## 2024-05-09 DIAGNOSIS — N941 Unspecified dyspareunia: Secondary | ICD-10-CM | POA: Diagnosis not present

## 2024-05-23 DIAGNOSIS — L700 Acne vulgaris: Secondary | ICD-10-CM | POA: Diagnosis not present

## 2024-05-24 DIAGNOSIS — M6281 Muscle weakness (generalized): Secondary | ICD-10-CM | POA: Diagnosis not present

## 2024-05-24 DIAGNOSIS — N941 Unspecified dyspareunia: Secondary | ICD-10-CM | POA: Diagnosis not present

## 2024-05-29 DIAGNOSIS — M6281 Muscle weakness (generalized): Secondary | ICD-10-CM | POA: Diagnosis not present

## 2024-05-29 DIAGNOSIS — N941 Unspecified dyspareunia: Secondary | ICD-10-CM | POA: Diagnosis not present

## 2024-05-29 NOTE — Procedures (Signed)
 Program_ID:[id number]  Access Code: [email or web address] URL: [email or web address] Date: 05-29-2024 Prepared By: SARI SPIKE  Program Notes   Exercises     - Supine Butterfly Groin Stretch - 1 x daily - 7 x weekly - 2 sets -  reps - 30 sec  hold     - Seated Piriformis Stretch with Trunk Bend - 1 x daily - 7 x weekly - 2 sets -  reps - 30 hold

## 2024-06-02 DIAGNOSIS — J358 Other chronic diseases of tonsils and adenoids: Secondary | ICD-10-CM | POA: Diagnosis not present

## 2024-06-05 DIAGNOSIS — M6281 Muscle weakness (generalized): Secondary | ICD-10-CM | POA: Diagnosis not present

## 2024-06-05 DIAGNOSIS — N941 Unspecified dyspareunia: Secondary | ICD-10-CM | POA: Diagnosis not present

## 2024-06-12 DIAGNOSIS — M6281 Muscle weakness (generalized): Secondary | ICD-10-CM | POA: Diagnosis not present

## 2024-06-12 DIAGNOSIS — N941 Unspecified dyspareunia: Secondary | ICD-10-CM | POA: Diagnosis not present

## 2024-06-20 DIAGNOSIS — L700 Acne vulgaris: Secondary | ICD-10-CM | POA: Diagnosis not present

## 2024-06-28 DIAGNOSIS — M6281 Muscle weakness (generalized): Secondary | ICD-10-CM | POA: Diagnosis not present

## 2024-06-28 DIAGNOSIS — N941 Unspecified dyspareunia: Secondary | ICD-10-CM | POA: Diagnosis not present

## 2024-07-07 ENCOUNTER — Other Ambulatory Visit: Payer: Self-pay | Admitting: Advanced Practice Midwife

## 2024-07-12 DIAGNOSIS — M6281 Muscle weakness (generalized): Secondary | ICD-10-CM | POA: Diagnosis not present

## 2024-07-12 DIAGNOSIS — N941 Unspecified dyspareunia: Secondary | ICD-10-CM | POA: Diagnosis not present

## 2024-07-19 ENCOUNTER — Encounter: Payer: Self-pay | Admitting: Obstetrics and Gynecology

## 2024-07-20 ENCOUNTER — Other Ambulatory Visit: Payer: Self-pay | Admitting: Lactation Services

## 2024-07-20 MED ORDER — FOLIVANE-OB 85-1 MG PO CAPS: 1.0000 | ORAL_CAPSULE | Freq: Every day | ORAL | 11 refills | Status: DC

## 2024-07-24 DIAGNOSIS — M791 Myalgia, unspecified site: Secondary | ICD-10-CM | POA: Diagnosis not present

## 2024-07-24 DIAGNOSIS — L700 Acne vulgaris: Secondary | ICD-10-CM | POA: Diagnosis not present

## 2024-07-25 DIAGNOSIS — M6281 Muscle weakness (generalized): Secondary | ICD-10-CM | POA: Diagnosis not present

## 2024-07-25 DIAGNOSIS — N941 Unspecified dyspareunia: Secondary | ICD-10-CM | POA: Diagnosis not present

## 2024-07-25 DIAGNOSIS — M25551 Pain in right hip: Secondary | ICD-10-CM | POA: Diagnosis not present

## 2024-07-25 DIAGNOSIS — M25552 Pain in left hip: Secondary | ICD-10-CM | POA: Diagnosis not present

## 2024-07-28 ENCOUNTER — Ambulatory Visit: Admitting: Adult Health

## 2024-07-28 VITALS — BP 113/72 | HR 62 | Ht 67.0 in | Wt 187.0 lb

## 2024-07-28 DIAGNOSIS — G43109 Migraine with aura, not intractable, without status migrainosus: Secondary | ICD-10-CM

## 2024-07-28 DIAGNOSIS — R5383 Other fatigue: Secondary | ICD-10-CM | POA: Diagnosis not present

## 2024-07-28 MED ORDER — SUMATRIPTAN 20 MG/ACT NA SOLN: NASAL | 2 refills | Status: AC

## 2024-07-28 NOTE — Progress Notes (Signed)
 PATIENT: Melissa Boyd DOB: 11/25/1995  REASON FOR VISIT: follow up HISTORY FROM: patient   Chief Complaint  Patient presents with   Follow-up    Migraines Rm4, alone     HISTORY OF PRESENT ILLNESS: Today 07/28/24   Melissa Boyd is a 28 y.o. female who has been followed in this office for migraines. Returns today for follow-up.  She reports that she still gets an aura that consists of blurry vision sometimes the headache will kind of other times it does not.  She states prior to pregnancy every time she would get blurry vision it would be followed by a migraine.  Sometimes the blurry vision is only in half of her visual field.  Fortunately she has not had to use any medication.  Typically can go to sleep and symptoms will resolve.  This happens approximately 1-2 times a month.  She states overall she feels very fatigued and feels that something is off.  She states that she feels mentally tired but physically she is fine.  Reports that dermatologist changed her to  isotretinoin, reports not sure if this is causing potential side effects although she states that she has been on a similar medicine for quite some time.  She returns today for an evaluation.   HISTORY 02/28/24:   Melissa Boyd is a 28 y.o. female with a history of migraine with aura. Returns today for follow-up.  She delivered twins in May.  She reports overall she has been doing well.  No increase in headaches.  She states that she will still get blurry vision but the headache does not come.  Prior to being pregnant she typically will get blurry vision followed by migraine.  She still uses Imitrex  as needed.  Does not feel that she needs a preventative medication at this time.  She is not breast-feeding.  Returns today for follow-up.   Patient has a clotting disorder as well.  She understands the risks of using estrogen in regards to migraine with aura and stroke risk      REVIEW OF SYSTEMS: Out of a complete 14  system review of symptoms, the patient complains only of the following symptoms, and all other reviewed systems are negative.   Listed in HPI  ALLERGIES: Allergies  Allergen Reactions   Other Anaphylaxis    Any products that contain shellfish   Shellfish Allergy Anaphylaxis   Naproxen Nausea And Vomiting   Amoxicillin Other (See Comments)   Doxycycline Other (See Comments)    HOME MEDICATIONS: Outpatient Medications Prior to Visit  Medication Sig Dispense Refill   enoxaparin  (LOVENOX ) 60 MG/0.6ML injection Inject 0.6 mLs (60 mg total) into the skin daily. 30 mL 6   EPINEPHrine  (AUVI-Q ) 0.3 mg/0.3 mL IJ SOAJ injection Use as directed for severe allergic reactions 4 each 3   hydrocortisone  2.5 % cream Apply topically 2 (two) times daily. 30 g 0   ISOtretinoin (ACCUTANE) 20 MG capsule Take 20 mg by mouth 2 (two) times daily.     Omega-3 Fatty Acids (FISH OIL PO) Take by mouth.     SUMAtriptan  (IMITREX ) 20 MG/ACT nasal spray PLACE 1 SPRAY INTO THE NOSE EVERY 2 HRS AS NEEDED FOR MIGRAINE OR HEADACHE. MAY REPEAT IN 2 HOURS IF HEADACHE PERSISTS OR RECURS. 6 each 2   ASPIRIN 81 PO Take 81 mg by mouth daily. (Patient not taking: Reported on 01/21/2024)     cyanocobalamin  (VITAMIN B12) 1000 MCG tablet Take 1,000 mcg by mouth daily.  Ferrous Sulfate (IRON PO) Take by mouth. (Patient not taking: Reported on 01/21/2024)     GAVILAX 17 GM/SCOOP powder Take by mouth daily.     metroNIDAZOLE  (FLAGYL  PO) Take by mouth. (Patient not taking: Reported on 01/21/2024)     Prenat w/o A Vit-FeFum-FePo-FA (FOLIVANE-OB) 85-1 MG CAPS Take 1 capsule by mouth daily. 30 capsule 11   Prenatal Vit-Fe Fumarate-FA (PRENATAL MULTIVITAMIN) TABS tablet Take 1 tablet by mouth daily at 12 noon.     No facility-administered medications prior to visit.    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   Ovarian cyst 05/23/2023   Subchorionic hematoma    Urticaria     PAST SURGICAL HISTORY: Past Surgical History:  Procedure Laterality Date   SHOULDER SURGERY     left   WISDOM TOOTH EXTRACTION      FAMILY HISTORY: Family History  Problem Relation Age of Onset   Anemia Father        low iron    Alzheimer's disease Paternal Grandfather    Migraines Brother    Hypertension Maternal Aunt    Migraines Cousin        Mother's 1st cousin; has them really bad    Migraines Cousin     SOCIAL HISTORY: Social History   Socioeconomic History   Marital status: Single    Spouse name: Not on file   Number of children: Not on file   Years of education: Not on file   Highest education level: Not on file  Occupational History   Not on file  Tobacco Use   Smoking status: Never   Smokeless tobacco: Never  Vaping Use   Vaping status: Never Used  Substance and Sexual Activity   Alcohol use: Not Currently   Drug use: No   Sexual activity: Yes    Birth control/protection: None  Other Topics Concern   Not on file  Social History Narrative   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 She enjoys track and field.   Lives in [place].    Right handed   Caffeine: maybe once a week. Toni)      Social Drivers of Health   Financial Resource Strain: Low Risk  (01/17/2024)   Received from [facility]   Overall Financial Resource Strain (CARDIA)    Difficulty of Paying Living Expenses: Not hard at all  Food Insecurity: No Food Insecurity (03/10/2024)   Received from [facility]   Hunger Vital Sign    Within the past 12 months, you worried that your food would run out before you got the money to buy more.: Never true    Within the past 12 months, the food you bought just didn't last and you didn't have money to get more.: Never true  Transportation Needs: No  Transportation Needs (03/10/2024)   Received from [facility] - Transportation    In the past 12 months, has lack of transportation kept you from medical appointments or from getting medications?: No    Lack of Transportation (Non-Medical): No  Physical Activity: Sufficiently Active (01/07/2024)   Received from [facility]   Exercise Vital Sign    On average, how many days per week do you  engage in moderate to strenuous exercise (like a brisk walk)?: 4 days    On average, how many minutes do you engage in exercise at this level?: 40 min  Stress: Stress Concern Present (01/07/2024)   Received from [facility] - Occupational Stress Questionnaire    Feeling of Stress : To some extent  Social Connections: Unknown (01/07/2024)   Received from [facility]   Social Connection and Isolation Panel    In a typical week, how many times do you talk on the phone with family, friends, or neighbors?: More than three times a week    How often do you get together with friends or relatives?: More than three times a week    How often do you attend church or religious services?: Patient declined    Do you belong to any clubs or organizations such as church groups, unions, fraternal or athletic groups, or school groups?: Patient declined    How often do you attend meetings of the clubs or organizations you belong to?: Patient declined    Are you married, widowed, divorced, separated, never married, or living with a partner?: Patient declined  Intimate Partner Violence: Not At Risk (07/31/2023)   Humiliation, Afraid, Rape, and Kick questionnaire    Fear of Current or Ex-Partner: No    Emotionally Abused: No    Physically Abused: No    Sexually Abused: No      PHYSICAL EXAM  Vitals:   07/28/24 1126  BP: 113/72  Pulse: 62  SpO2: 99%  Weight: 187 lb (84.8 kg)  Height: 5' 7 (1.702  m)   Body mass index is 29.29 kg/m.  Generalized: Well developed, in no acute distress   Neurological examination  Mentation: Alert oriented to time, place, history taking. Follows all commands speech and language fluent Cranial nerve II-XII: Pupils were equal round reactive to light. Extraocular movements were full, visual field were full on confrontational test. Facial sensation and strength were normal. Uvula tongue midline. Head turning and shoulder shrug  were normal and symmetric. Motor: The motor testing reveals 5 over 5 strength of all 4 extremities. Good symmetric motor tone is noted throughout.  Sensory: Sensory testing is intact to soft touch on all 4 extremities. No evidence of extinction is noted.  Coordination: Cerebellar testing reveals good finger-nose-finger and heel-to-shin bilaterally.  Gait and station: Gait is normal.  Reflexes: Deep tendon reflexes are symmetric and normal bilaterally.   DIAGNOSTIC DATA (LABS, IMAGING, TESTING) - I reviewed patient records, labs, notes, testing and imaging myself where available.  Lab Results  Component Value Date   WBC 5.4 12/17/2023   HGB 12.4 12/17/2023   HCT 34.9 (L) 12/17/2023   MCV 100.0 12/17/2023   PLT 148 (L) 12/17/2023      Component Value Date/Time   NA 133 (L) 12/17/2023 1256   NA 137 11/13/2020 1521   NA 140 08/02/2014 1842   K 4.0 12/17/2023 1256   K 3.8 08/02/2014 1842   CL 104 12/17/2023 1256   CL 109 (H) 08/02/2014 1842   CO2 24 12/17/2023 1256   CO2 24 08/02/2014 1842   GLUCOSE 80 12/17/2023 1256   GLUCOSE 70 08/02/2014 1842   BUN 8 12/17/2023 1256   BUN 10 11/13/2020 1521   BUN 15 08/02/2014 1842   CREATININE 0.69 12/17/2023 1256   CREATININE 1.05 08/02/2014 1842   CALCIUM 8.8 (L) 12/17/2023 1256   CALCIUM 8.0 (L) 08/02/2014  1842   PROT 6.4 (L) 12/17/2023 1256   PROT 7.4 11/13/2020 1521   ALBUMIN 3.4 (L) 12/17/2023 1256   ALBUMIN 4.3 11/13/2020 1521   AST 14 (L) 12/17/2023 1256   ALT 16  12/17/2023 1256   ALKPHOS 115 12/17/2023 1256   BILITOT 0.3 12/17/2023 1256   GFRNONAA >60 12/17/2023 1256   GFRNONAA >60 08/02/2014 1842   GFRAA >90 12/23/2014 1433   GFRAA >60 08/02/2014 1842   No results found for: CHOL, HDL, LDLCALC, LDLDIRECT, TRIG, CHOLHDL Lab Results  Component Value Date   HGBA1C 5.2 07/29/2023   Lab Results  Component Value Date   VITAMINB12 491 12/17/2023   Lab Results  Component Value Date   TSH 1.717 10/12/2023      ASSESSMENT AND PLAN 28 y.o. 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 here with:  Migraine headaches with aura  - Slight change in her migraines as she can now get an aura but not the headache pain. - We will continue to monitor.  Certainly if she develops other symptoms may consider imaging - We will check blood work today due to fatigue.  CBC, CMP, iron panel, vitamin B12, vitamin D   Meds ordered this encounter  Medications   SUMAtriptan  (IMITREX ) 20 MG/ACT nasal spray    Sig: PLACE 1 SPRAY INTO THE NOSE EVERY 2 HRS AS NEEDED FOR MIGRAINE OR HEADACHE. MAY REPEAT IN 2 HOURS IF HEADACHE PERSISTS OR RECURS.    Dispense:  6 each    Refill:  2    Supervising Provider:   YAN, YIJUN [phone]   Orders Placed This Encounter  Procedures   Iron, TIBC and Ferritin Panel   Iron, TIBC and Ferritin Panel   CBC with Differential/Platelet   Comprehensive metabolic panel with GFR   Vitamin B12   Vitamin D, 25-hydroxy     Melissa Demarinis, MSN, NP-C 07/28/2024, 12:04 PM [facility] [address] [place], [place] [address] [phone]  The patient's condition requires frequent monitoring and adjustments in the treatment plan, reflecting the ongoing complexity of care.  This provider is the continuing focal point for all needed  services for this condition.

## 2024-07-28 NOTE — Patient Instructions (Addendum)
 Your Plan:  Blood work today  Sumatriptan  refilled  Thank you for coming to see us  at [facility]. I hope we have been able to provide you high quality care today.  You may receive a patient satisfaction survey over the next few weeks. We would appreciate your feedback and comments so that we may continue to improve ourselves and the health of our patients.

## 2024-07-29 LAB — COMPREHENSIVE METABOLIC PANEL WITH GFR
ALT: 15 IU/L (ref 0–32)
AST: 17 IU/L (ref 0–40)
Albumin: 4.4 g/dL (ref 4.0–5.0)
Alkaline Phosphatase: 58 IU/L (ref 41–116)
BUN/Creatinine Ratio: 14 (ref 9–23)
BUN: 12 mg/dL (ref 6–20)
Bilirubin Total: 0.3 mg/dL (ref 0.0–1.2)
CO2: 24 mmol/L (ref 20–29)
Calcium: 9.1 mg/dL (ref 8.7–10.2)
Chloride: 102 mmol/L (ref 96–106)
Creatinine, Ser: 0.88 mg/dL (ref 0.57–1.00)
Globulin, Total: 2.9 g/dL (ref 1.5–4.5)
Glucose: 80 mg/dL (ref 70–99)
Potassium: 4.4 mmol/L (ref 3.5–5.2)
Sodium: 138 mmol/L (ref 134–144)
Total Protein: 7.3 g/dL (ref 6.0–8.5)
eGFR: 91 mL/min/1.73 (ref >59)

## 2024-07-29 LAB — CBC WITH DIFFERENTIAL/PLATELET
Basophils Absolute: 0.1 x10E3/uL (ref 0.0–0.2)
Basos: 1 %
EOS (ABSOLUTE): 0.1 x10E3/uL (ref 0.0–0.4)
Eos: 1 %
Hematocrit: 39.1 % (ref 34.0–46.6)
Hemoglobin: 13.3 g/dL (ref 11.1–15.9)
Immature Grans (Abs): 0 x10E3/uL (ref 0.0–0.1)
Immature Granulocytes: 0 %
Lymphocytes Absolute: 2.2 x10E3/uL (ref 0.7–3.1)
Lymphs: 51 %
MCH: 34.1 pg — ABNORMAL HIGH (ref 26.6–33.0)
MCHC: 34 g/dL (ref 31.5–35.7)
MCV: 100 fL — ABNORMAL HIGH (ref 79–97)
Monocytes Absolute: 0.4 x10E3/uL (ref 0.1–0.9)
Monocytes: 8 %
Neutrophils Absolute: 1.7 x10E3/uL (ref 1.4–7.0)
Neutrophils: 39 %
Platelets: 213 x10E3/uL (ref 150–450)
RBC: 3.9 x10E6/uL (ref 3.77–5.28)
RDW: 11.6 % — ABNORMAL LOW (ref 11.7–15.4)
WBC: 4.4 x10E3/uL (ref 3.4–10.8)

## 2024-07-29 LAB — IRON,TIBC AND FERRITIN PANEL
Ferritin: 114 ng/mL (ref 15–150)
Iron Saturation: 39 % (ref 15–55)
Iron: 93 ug/dL (ref 27–159)
Total Iron Binding Capacity: 237 ug/dL — ABNORMAL LOW (ref 250–450)
UIBC: 144 ug/dL (ref 131–425)

## 2024-07-29 LAB — VITAMIN B12: Vitamin B-12: 912 pg/mL (ref 232–1245)

## 2024-07-29 LAB — VITAMIN D 25 HYDROXY (VIT D DEFICIENCY, FRACTURES): Vit D, 25-Hydroxy: 52.8 ng/mL (ref 30.0–100.0)

## 2024-08-02 ENCOUNTER — Ambulatory Visit: Payer: Self-pay | Admitting: Adult Health

## 2024-08-08 DIAGNOSIS — M25552 Pain in left hip: Secondary | ICD-10-CM | POA: Diagnosis not present

## 2024-08-08 DIAGNOSIS — M6281 Muscle weakness (generalized): Secondary | ICD-10-CM | POA: Diagnosis not present

## 2024-08-08 DIAGNOSIS — N941 Unspecified dyspareunia: Secondary | ICD-10-CM | POA: Diagnosis not present

## 2024-08-08 DIAGNOSIS — M25551 Pain in right hip: Secondary | ICD-10-CM | POA: Diagnosis not present

## 2024-08-21 DIAGNOSIS — L700 Acne vulgaris: Secondary | ICD-10-CM | POA: Diagnosis not present

## 2024-08-22 DIAGNOSIS — N941 Unspecified dyspareunia: Secondary | ICD-10-CM | POA: Diagnosis not present

## 2024-08-22 DIAGNOSIS — M6281 Muscle weakness (generalized): Secondary | ICD-10-CM | POA: Diagnosis not present

## 2024-08-22 DIAGNOSIS — M25551 Pain in right hip: Secondary | ICD-10-CM | POA: Diagnosis not present

## 2024-08-22 DIAGNOSIS — M25552 Pain in left hip: Secondary | ICD-10-CM | POA: Diagnosis not present

## 2024-09-04 DIAGNOSIS — R55 Syncope and collapse: Secondary | ICD-10-CM | POA: Diagnosis not present

## 2024-09-04 DIAGNOSIS — Z86718 Personal history of other venous thrombosis and embolism: Secondary | ICD-10-CM | POA: Diagnosis not present

## 2024-09-04 DIAGNOSIS — Z1152 Encounter for screening for COVID-19: Secondary | ICD-10-CM | POA: Diagnosis not present

## 2024-09-04 DIAGNOSIS — I10 Essential (primary) hypertension: Secondary | ICD-10-CM | POA: Diagnosis not present

## 2024-09-04 DIAGNOSIS — R000 Tachycardia, unspecified: Secondary | ICD-10-CM | POA: Diagnosis not present

## 2024-09-04 DIAGNOSIS — R112 Nausea with vomiting, unspecified: Secondary | ICD-10-CM | POA: Diagnosis not present

## 2024-09-04 DIAGNOSIS — R42 Dizziness and giddiness: Secondary | ICD-10-CM | POA: Diagnosis not present
